# Patient Record
Sex: Female | Born: 1935 | Race: White | Hispanic: No | Smoking: Former smoker
Health system: Southern US, Community
[De-identification: ages and names within clinical notes are randomized; demographics above are authoritative.]

## PROBLEM LIST (undated history)

## (undated) DIAGNOSIS — K219 Gastro-esophageal reflux disease without esophagitis: Secondary | ICD-10-CM

## (undated) DIAGNOSIS — E1121 Type 2 diabetes mellitus with diabetic nephropathy: Secondary | ICD-10-CM

## (undated) DIAGNOSIS — M1990 Unspecified osteoarthritis, unspecified site: Secondary | ICD-10-CM

## (undated) DIAGNOSIS — I509 Heart failure, unspecified: Secondary | ICD-10-CM

## (undated) DIAGNOSIS — R32 Unspecified urinary incontinence: Secondary | ICD-10-CM

## (undated) DIAGNOSIS — E119 Type 2 diabetes mellitus without complications: Secondary | ICD-10-CM

## (undated) DIAGNOSIS — I2510 Atherosclerotic heart disease of native coronary artery without angina pectoris: Secondary | ICD-10-CM

## (undated) DIAGNOSIS — D696 Thrombocytopenia, unspecified: Secondary | ICD-10-CM

## (undated) DIAGNOSIS — E1142 Type 2 diabetes mellitus with diabetic polyneuropathy: Secondary | ICD-10-CM

## (undated) DIAGNOSIS — E669 Obesity, unspecified: Secondary | ICD-10-CM

## (undated) DIAGNOSIS — I10 Essential (primary) hypertension: Secondary | ICD-10-CM

## (undated) DIAGNOSIS — G2581 Restless legs syndrome: Secondary | ICD-10-CM

## (undated) DIAGNOSIS — E785 Hyperlipidemia, unspecified: Secondary | ICD-10-CM

## (undated) DIAGNOSIS — E113299 Type 2 diabetes mellitus with mild nonproliferative diabetic retinopathy without macular edema, unspecified eye: Secondary | ICD-10-CM

## (undated) HISTORY — PX: OTHER SURGICAL HISTORY: SHX169

## (undated) HISTORY — PX: ABDOMINAL HYSTERECTOMY: SHX81

## (undated) HISTORY — DX: Essential (primary) hypertension: I10

## (undated) HISTORY — DX: Thrombocytopenia, unspecified: D69.6

## (undated) HISTORY — DX: Obesity, unspecified: E66.9

## (undated) HISTORY — DX: Type 2 diabetes mellitus without complications: E11.9

## (undated) HISTORY — DX: Heart failure, unspecified: I50.9

## (undated) HISTORY — PX: CORONARY ANGIOPLASTY: SHX604

## (undated) HISTORY — DX: Atherosclerotic heart disease of native coronary artery without angina pectoris: I25.10

## (undated) HISTORY — DX: Hyperlipidemia, unspecified: E78.5

## (undated) HISTORY — PX: BONE MARROW BIOPSY: SHX199

---

## 1966-07-17 HISTORY — PX: CHOLECYSTECTOMY: SHX55

## 1968-07-17 HISTORY — PX: BREAST BIOPSY: SHX20

## 2003-02-15 HISTORY — PX: CORONARY ARTERY BYPASS GRAFT: SHX141

## 2011-09-18 ENCOUNTER — Ambulatory Visit: Payer: Self-pay | Admitting: Internal Medicine

## 2012-05-27 ENCOUNTER — Ambulatory Visit: Payer: Self-pay | Admitting: Internal Medicine

## 2012-05-27 LAB — CREATININE, SERUM
Creatinine: 1.01 mg/dL (ref 0.60–1.30)
EGFR (Non-African Amer.): 53 — ABNORMAL LOW

## 2012-06-09 ENCOUNTER — Emergency Department: Payer: Self-pay | Admitting: Emergency Medicine

## 2012-06-09 LAB — BASIC METABOLIC PANEL
Anion Gap: 8 (ref 7–16)
BUN: 28 mg/dL — ABNORMAL HIGH (ref 7–18)
Calcium, Total: 9.8 mg/dL (ref 8.5–10.1)
Chloride: 102 mmol/L (ref 98–107)
Co2: 27 mmol/L (ref 21–32)
Creatinine: 1.12 mg/dL (ref 0.60–1.30)
EGFR (African American): 54 — ABNORMAL LOW
Potassium: 3.8 mmol/L (ref 3.5–5.1)
Sodium: 137 mmol/L (ref 136–145)

## 2012-06-09 LAB — CBC
HCT: 36.5 % (ref 35.0–47.0)
MCH: 27.2 pg (ref 26.0–34.0)
MCHC: 32.8 g/dL (ref 32.0–36.0)
MCV: 83 fL (ref 80–100)
RDW: 14.8 % — ABNORMAL HIGH (ref 11.5–14.5)

## 2012-06-09 LAB — CK TOTAL AND CKMB (NOT AT ARMC): CK, Total: 88 U/L (ref 21–215)

## 2012-11-15 ENCOUNTER — Ambulatory Visit: Payer: Self-pay | Admitting: Internal Medicine

## 2012-11-25 ENCOUNTER — Ambulatory Visit: Payer: Self-pay | Admitting: Internal Medicine

## 2014-02-16 ENCOUNTER — Ambulatory Visit: Payer: Self-pay | Admitting: Internal Medicine

## 2014-11-11 ENCOUNTER — Ambulatory Visit
Admit: 2014-11-11 | Disposition: A | Discharge status: Home / Self Care | Payer: Self-pay | Attending: Internal Medicine | Admitting: Internal Medicine

## 2015-08-04 ENCOUNTER — Other Ambulatory Visit: Payer: Self-pay | Admitting: Internal Medicine

## 2015-08-04 DIAGNOSIS — Z1231 Encounter for screening mammogram for malignant neoplasm of breast: Secondary | ICD-10-CM

## 2015-08-18 ENCOUNTER — Ambulatory Visit
Admission: RE | Admit: 2015-08-18 | Discharge: 2015-08-18 | Disposition: A | Discharge status: Home / Self Care | Payer: Medicare Other | Source: Ambulatory Visit | Attending: Internal Medicine | Admitting: Internal Medicine

## 2015-08-18 ENCOUNTER — Other Ambulatory Visit: Payer: Self-pay | Admitting: Internal Medicine

## 2015-08-18 DIAGNOSIS — Z1231 Encounter for screening mammogram for malignant neoplasm of breast: Secondary | ICD-10-CM | POA: Insufficient documentation

## 2015-11-29 ENCOUNTER — Ambulatory Visit
Admission: RE | Admit: 2015-11-29 | Discharge: 2015-11-29 | Disposition: A | Discharge status: Home / Self Care | Payer: Medicare Other | Source: Ambulatory Visit | Attending: Internal Medicine | Admitting: Internal Medicine

## 2015-11-29 ENCOUNTER — Other Ambulatory Visit: Payer: Self-pay | Admitting: Internal Medicine

## 2015-11-29 DIAGNOSIS — R600 Localized edema: Secondary | ICD-10-CM | POA: Diagnosis present

## 2016-08-15 ENCOUNTER — Other Ambulatory Visit: Payer: Self-pay | Admitting: Internal Medicine

## 2016-08-15 DIAGNOSIS — Z1231 Encounter for screening mammogram for malignant neoplasm of breast: Secondary | ICD-10-CM

## 2016-08-24 ENCOUNTER — Ambulatory Visit
Admission: RE | Admit: 2016-08-24 | Discharge: 2016-08-24 | Disposition: A | Discharge status: Home / Self Care | Payer: Medicare Other | Source: Ambulatory Visit | Attending: Internal Medicine | Admitting: Internal Medicine

## 2016-08-24 DIAGNOSIS — R921 Mammographic calcification found on diagnostic imaging of breast: Secondary | ICD-10-CM | POA: Insufficient documentation

## 2016-08-24 DIAGNOSIS — Z1231 Encounter for screening mammogram for malignant neoplasm of breast: Secondary | ICD-10-CM | POA: Diagnosis not present

## 2016-08-28 ENCOUNTER — Other Ambulatory Visit: Payer: Self-pay | Admitting: Internal Medicine

## 2016-08-28 DIAGNOSIS — R928 Other abnormal and inconclusive findings on diagnostic imaging of breast: Secondary | ICD-10-CM

## 2016-08-28 DIAGNOSIS — R921 Mammographic calcification found on diagnostic imaging of breast: Secondary | ICD-10-CM

## 2016-09-08 ENCOUNTER — Ambulatory Visit
Admission: RE | Admit: 2016-09-08 | Discharge: 2016-09-08 | Disposition: A | Discharge status: Home / Self Care | Payer: Medicare Other | Source: Ambulatory Visit | Attending: Internal Medicine | Admitting: Internal Medicine

## 2016-09-08 DIAGNOSIS — R921 Mammographic calcification found on diagnostic imaging of breast: Secondary | ICD-10-CM | POA: Insufficient documentation

## 2016-09-08 DIAGNOSIS — R928 Other abnormal and inconclusive findings on diagnostic imaging of breast: Secondary | ICD-10-CM

## 2016-09-11 ENCOUNTER — Other Ambulatory Visit: Payer: Self-pay | Admitting: Internal Medicine

## 2016-09-11 DIAGNOSIS — R921 Mammographic calcification found on diagnostic imaging of breast: Secondary | ICD-10-CM

## 2016-09-11 DIAGNOSIS — R928 Other abnormal and inconclusive findings on diagnostic imaging of breast: Secondary | ICD-10-CM

## 2016-09-13 ENCOUNTER — Ambulatory Visit
Admission: RE | Admit: 2016-09-13 | Discharge: 2016-09-13 | Disposition: A | Discharge status: Home / Self Care | Payer: Medicare Other | Source: Ambulatory Visit | Attending: Internal Medicine | Admitting: Internal Medicine

## 2016-09-13 DIAGNOSIS — R921 Mammographic calcification found on diagnostic imaging of breast: Secondary | ICD-10-CM | POA: Insufficient documentation

## 2016-09-13 DIAGNOSIS — N6021 Fibroadenosis of right breast: Secondary | ICD-10-CM | POA: Insufficient documentation

## 2016-09-13 DIAGNOSIS — R928 Other abnormal and inconclusive findings on diagnostic imaging of breast: Secondary | ICD-10-CM

## 2016-09-13 DIAGNOSIS — N6091 Unspecified benign mammary dysplasia of right breast: Secondary | ICD-10-CM | POA: Insufficient documentation

## 2016-09-13 HISTORY — PX: BREAST BIOPSY: SHX20

## 2016-09-15 LAB — SURGICAL PATHOLOGY

## 2016-12-26 ENCOUNTER — Other Ambulatory Visit
Admission: RE | Admit: 2016-12-26 | Discharge: 2016-12-26 | Disposition: A | Discharge status: Home / Self Care | Payer: Medicare Other | Source: Ambulatory Visit | Attending: Gastroenterology | Admitting: Gastroenterology

## 2016-12-26 DIAGNOSIS — D649 Anemia, unspecified: Secondary | ICD-10-CM | POA: Insufficient documentation

## 2016-12-26 DIAGNOSIS — R195 Other fecal abnormalities: Secondary | ICD-10-CM | POA: Diagnosis present

## 2016-12-26 LAB — GASTROINTESTINAL PANEL BY PCR, STOOL (REPLACES STOOL CULTURE)
ADENOVIRUS F40/41: NOT DETECTED
Astrovirus: NOT DETECTED
CAMPYLOBACTER SPECIES: NOT DETECTED
Cryptosporidium: NOT DETECTED
Cyclospora cayetanensis: NOT DETECTED
ENTEROAGGREGATIVE E COLI (EAEC): NOT DETECTED
ENTEROPATHOGENIC E COLI (EPEC): NOT DETECTED
Entamoeba histolytica: NOT DETECTED
Enterotoxigenic E coli (ETEC): NOT DETECTED
GIARDIA LAMBLIA: NOT DETECTED
NOROVIRUS GI/GII: NOT DETECTED
PLESIMONAS SHIGELLOIDES: NOT DETECTED
ROTAVIRUS A: NOT DETECTED
SHIGELLA/ENTEROINVASIVE E COLI (EIEC): NOT DETECTED
Salmonella species: NOT DETECTED
Sapovirus (I, II, IV, and V): NOT DETECTED
Shiga like toxin producing E coli (STEC): NOT DETECTED
Vibrio cholerae: NOT DETECTED
Vibrio species: NOT DETECTED
Yersinia enterocolitica: NOT DETECTED

## 2017-01-01 ENCOUNTER — Encounter: Payer: Self-pay | Admitting: *Deleted

## 2017-01-02 ENCOUNTER — Ambulatory Visit
Admission: RE | Admit: 2017-01-02 | Discharge: 2017-01-02 | Disposition: A | Discharge status: Home / Self Care | Payer: Medicare Other | Source: Ambulatory Visit | Attending: Gastroenterology | Admitting: Gastroenterology

## 2017-01-02 ENCOUNTER — Encounter: Payer: Self-pay | Admitting: Anesthesiology

## 2017-01-02 ENCOUNTER — Ambulatory Visit: Payer: Medicare Other | Admitting: Certified Registered Nurse Anesthetist

## 2017-01-02 ENCOUNTER — Encounter
Admission: RE | Disposition: A | Discharge status: Home / Self Care | Payer: Self-pay | Source: Ambulatory Visit | Attending: Gastroenterology

## 2017-01-02 ENCOUNTER — Telehealth: Payer: Self-pay | Admitting: Interventional Cardiology

## 2017-01-02 DIAGNOSIS — K529 Noninfective gastroenteritis and colitis, unspecified: Secondary | ICD-10-CM | POA: Insufficient documentation

## 2017-01-02 DIAGNOSIS — Z951 Presence of aortocoronary bypass graft: Secondary | ICD-10-CM | POA: Diagnosis not present

## 2017-01-02 DIAGNOSIS — Z794 Long term (current) use of insulin: Secondary | ICD-10-CM | POA: Diagnosis not present

## 2017-01-02 DIAGNOSIS — Z6838 Body mass index (BMI) 38.0-38.9, adult: Secondary | ICD-10-CM | POA: Diagnosis not present

## 2017-01-02 DIAGNOSIS — E1142 Type 2 diabetes mellitus with diabetic polyneuropathy: Secondary | ICD-10-CM | POA: Diagnosis not present

## 2017-01-02 DIAGNOSIS — E11319 Type 2 diabetes mellitus with unspecified diabetic retinopathy without macular edema: Secondary | ICD-10-CM | POA: Diagnosis not present

## 2017-01-02 DIAGNOSIS — I2510 Atherosclerotic heart disease of native coronary artery without angina pectoris: Secondary | ICD-10-CM | POA: Insufficient documentation

## 2017-01-02 DIAGNOSIS — I10 Essential (primary) hypertension: Secondary | ICD-10-CM | POA: Insufficient documentation

## 2017-01-02 DIAGNOSIS — Z87891 Personal history of nicotine dependence: Secondary | ICD-10-CM | POA: Diagnosis not present

## 2017-01-02 DIAGNOSIS — E785 Hyperlipidemia, unspecified: Secondary | ICD-10-CM | POA: Diagnosis not present

## 2017-01-02 DIAGNOSIS — Q399 Congenital malformation of esophagus, unspecified: Secondary | ICD-10-CM | POA: Insufficient documentation

## 2017-01-02 DIAGNOSIS — K2960 Other gastritis without bleeding: Secondary | ICD-10-CM | POA: Insufficient documentation

## 2017-01-02 DIAGNOSIS — E1121 Type 2 diabetes mellitus with diabetic nephropathy: Secondary | ICD-10-CM | POA: Diagnosis not present

## 2017-01-02 DIAGNOSIS — Z7982 Long term (current) use of aspirin: Secondary | ICD-10-CM | POA: Insufficient documentation

## 2017-01-02 DIAGNOSIS — D124 Benign neoplasm of descending colon: Secondary | ICD-10-CM | POA: Insufficient documentation

## 2017-01-02 DIAGNOSIS — K210 Gastro-esophageal reflux disease with esophagitis: Secondary | ICD-10-CM | POA: Diagnosis not present

## 2017-01-02 DIAGNOSIS — D509 Iron deficiency anemia, unspecified: Secondary | ICD-10-CM | POA: Insufficient documentation

## 2017-01-02 DIAGNOSIS — Z880 Allergy status to penicillin: Secondary | ICD-10-CM | POA: Insufficient documentation

## 2017-01-02 DIAGNOSIS — E669 Obesity, unspecified: Secondary | ICD-10-CM | POA: Insufficient documentation

## 2017-01-02 DIAGNOSIS — Z955 Presence of coronary angioplasty implant and graft: Secondary | ICD-10-CM | POA: Insufficient documentation

## 2017-01-02 DIAGNOSIS — G2581 Restless legs syndrome: Secondary | ICD-10-CM | POA: Diagnosis not present

## 2017-01-02 HISTORY — PX: COLONOSCOPY WITH PROPOFOL: SHX5780

## 2017-01-02 HISTORY — DX: Unspecified urinary incontinence: R32

## 2017-01-02 HISTORY — PX: ESOPHAGOGASTRODUODENOSCOPY (EGD) WITH PROPOFOL: SHX5813

## 2017-01-02 HISTORY — DX: Type 2 diabetes mellitus with diabetic polyneuropathy: E11.42

## 2017-01-02 HISTORY — DX: Gastro-esophageal reflux disease without esophagitis: K21.9

## 2017-01-02 HISTORY — DX: Restless legs syndrome: G25.81

## 2017-01-02 HISTORY — DX: Type 2 diabetes mellitus with diabetic nephropathy: E11.21

## 2017-01-02 HISTORY — DX: Unspecified osteoarthritis, unspecified site: M19.90

## 2017-01-02 HISTORY — DX: Type 2 diabetes mellitus with mild nonproliferative diabetic retinopathy without macular edema, unspecified eye: E11.3299

## 2017-01-02 LAB — GLUCOSE, CAPILLARY: Glucose-Capillary: 101 mg/dL — ABNORMAL HIGH (ref 65–99)

## 2017-01-02 SURGERY — COLONOSCOPY WITH PROPOFOL
Anesthesia: General

## 2017-01-02 MED ORDER — GLYCOPYRROLATE 0.2 MG/ML IJ SOLN
INTRAMUSCULAR | Status: DC | PRN
  Administered 2017-01-02: 0.2 mg via INTRAVENOUS

## 2017-01-02 MED ORDER — LIDOCAINE HCL (PF) 2 % IJ SOLN
INTRAMUSCULAR | Status: AC
  Filled 2017-01-02: qty 2

## 2017-01-02 MED ORDER — SODIUM CHLORIDE 0.9 % IV SOLN
INTRAVENOUS | Status: DC
  Administered 2017-01-02: 1000 mL via INTRAVENOUS

## 2017-01-02 MED ORDER — FENTANYL CITRATE (PF) 100 MCG/2ML IJ SOLN
INTRAMUSCULAR | Status: AC
  Filled 2017-01-02: qty 2

## 2017-01-02 MED ORDER — PROPOFOL 10 MG/ML IV BOLUS
INTRAVENOUS | Status: DC | PRN
  Administered 2017-01-02 (×4): 16 mg via INTRAVENOUS

## 2017-01-02 MED ORDER — GLYCOPYRROLATE 0.2 MG/ML IJ SOLN
INTRAMUSCULAR | Status: AC
  Filled 2017-01-02: qty 1

## 2017-01-02 MED ORDER — FENTANYL CITRATE (PF) 100 MCG/2ML IJ SOLN
INTRAMUSCULAR | Status: DC | PRN
  Administered 2017-01-02: 100 ug via INTRAVENOUS

## 2017-01-02 MED ORDER — SODIUM CHLORIDE 0.9 % IV SOLN: INTRAVENOUS | Status: DC

## 2017-01-02 MED ORDER — PROPOFOL 500 MG/50ML IV EMUL
INTRAVENOUS | Status: DC | PRN
  Administered 2017-01-02: 70 ug/kg/min via INTRAVENOUS

## 2017-01-02 MED ORDER — PROPOFOL 500 MG/50ML IV EMUL
INTRAVENOUS | Status: AC
  Filled 2017-01-02: qty 50

## 2017-01-02 MED ORDER — LIDOCAINE HCL (CARDIAC) 20 MG/ML IV SOLN
INTRAVENOUS | Status: DC | PRN
  Administered 2017-01-02: 70 mg via INTRAVENOUS
  Administered 2017-01-02: 100 mg via INTRAVENOUS
  Administered 2017-01-02: 30 mg via INTRAVENOUS

## 2017-01-02 NOTE — Transfer of Care (Signed)
Immediate Anesthesia Transfer of Care Note  Patient: Katherine Hernandez  Procedure(s) Performed: Procedure(s): COLONOSCOPY WITH PROPOFOL (N/A) ESOPHAGOGASTRODUODENOSCOPY (EGD) WITH PROPOFOL (N/A)  Patient Location: PACU  Anesthesia Type:General  Level of Consciousness: awake and patient cooperative  Airway & Oxygen Therapy: Patient Spontanous Breathing and Patient connected to nasal cannula oxygen  Post-op Assessment: Report given to RN and Post -op Vital signs reviewed and stable  Post vital signs: Reviewed and stable  Last Vitals:  Vitals:   01/02/17 0658  BP: (!) 180/44  Pulse: 82  Resp: 16  Temp: (!) 35.7 C    Last Pain:  Vitals:   01/02/17 0658  TempSrc: Tympanic         Complications: No apparent anesthesia complications

## 2017-01-02 NOTE — Op Note (Signed)
[facility] Gastroenterology Patient Name: Katherine Hernandez Procedure Date: 01/02/2017 7:36 AM MRN: [id number] Account #: [id number] Date of Birth: Dec 08, 1929 Admit Type: Outpatient Age: 83 Room: [facility] ENDO ROOM 3 Gender: Female Note Status: Finalized Procedure:            Colonoscopy Indications:          Heme positive stool, Iron deficiency anemia Providers:            Lollie Sails, MD Referring MD:         Tracie Harrier, MD (Referring MD) Medicines:            Monitored Anesthesia Care Complications:        No immediate complications. Procedure:            Pre-Anesthesia Assessment:                       - ASA Grade Assessment: III - A patient with severe                        systemic disease.                       After obtaining informed consent, the colonoscope was                        passed under direct vision. Throughout the procedure,                        the patient's blood pressure, pulse, and oxygen                        saturations were monitored continuously. The                        Colonoscope was introduced through the anus and                        advanced to the the terminal ileum. The colonoscopy was                        performed without difficulty. The patient tolerated the                        procedure well. The quality of the bowel preparation                        was fair. Findings:      A single (solitary) twelve mm ulcer with a shallow ulcer crater was       found at the ileocecal valve. No bleeding was present. No stigmata of       recent bleeding were seen. Biopsies were taken with a cold forceps for       histology.      Biopsies for histology were taken with a cold forceps from the right       colon and left colon for evaluation of microscopic colitis.      A 5 mm polyp was found in the descending colon. The polyp was flat. The       polyp was removed with a cold snare. Resection and retrieval were   complete.      The retroflexed  view of the distal rectum and anal verge was normal and       showed no anal or rectal abnormalities.      The digital rectal exam was normal. Impression:           - Preparation of the colon was fair.                       - A single (solitary) ulcer at the distal ileocecal                        valve. Biopsied.                       - One 5 mm polyp in the descending colon, removed with                        a cold snare. Resected and retrieved.                       - The distal rectum and anal verge are normal on                        retroflexion view.                       - Biopsies were taken with a cold forceps from the                        right colon and left colon for evaluation of                        microscopic colitis. Recommendation:       - Discharge patient to home.                       - Await pathology results.                       - Return to GI clinic in 4 weeks. Procedure Code(s):    --- Professional ---                       [phone], Colonoscopy, flexible; with removal of tumor(s),                        polyp(s), or other lesion(s) by snare technique                       [id number], 80, Colonoscopy, flexible; with biopsy, single                        or multiple Diagnosis Code(s):    --- Professional ---                       K63.3, Ulcer of intestine                       D12.4, Benign neoplasm of descending colon                       R19.5, Other fecal abnormalities  D50.9, Iron deficiency anemia, unspecified CPT copyright 2016 American Medical Association. All rights reserved. The codes documented in this report are preliminary and upon coder review may  be revised to meet current compliance requirements. Lollie Sails, MD 01/02/2017 8:33:18 AM This report has been signed electronically. Number of Addenda: 0 Note Initiated On: 01/02/2017 7:36 AM Scope Withdrawal Time: 0 hours 13 minutes 13  seconds  Total Procedure Duration: 0 hours 20 minutes 59 seconds       [employer] [facility]

## 2017-01-02 NOTE — H&P (Signed)
Outpatient short stay form Pre-procedure 01/02/2017 7:21 AM Lollie Sails MD  Primary Physician: Dr Tracie Harrier, Dr. Daneen Schick  Reason for visit:  EGD and colonoscopy  History of present illness:  Patient is a 83 year old female presenting today as above. She has a history of some abdominal pain has been occurring in her epigastric region but also complained of some right lower quadrant pain. She has been found to be iron deficient with a trace of blood in her stool. He has seen some very dark stools on occasion. She had been taking both NSAIDs several times a week as well as a daily 325 mg aspirin. She is taking that dose for many years. She states she is held that for this procedure for about 5 or 6 days. She tolerated her prep well. She denies any other aspirin products or blood thinning agents. She does take a twice a day PPI which has helped somewhat with the epigastric pain. She has had a colonoscopy in the past however this was about 20 or 30 years ago. There is no known history of colon cancer or gastric cancer in her family.    Current Facility-Administered Medications:  .  0.9 %  sodium chloride infusion, , Intravenous, Continuous, Lollie Sails, MD, Last Rate: 20 mL/hr at 01/02/17 0714, 1,000 mL at 01/02/17 0714 .  0.9 %  sodium chloride infusion, , Intravenous, Continuous, Lollie Sails, MD  Prescriptions Prior to Admission  Medication Sig Dispense Refill Last Dose  . aspirin 325 MG tablet Take 325 mg by mouth daily.   Past Week at Unknown time  . budesonide-formoterol (SYMBICORT) 160-4.5 MCG/ACT inhaler Inhale 2 puffs into the lungs 2 (two) times daily.   01/02/2017 at 0600  . cetirizine (ZYRTEC) 10 MG tablet Take 10 mg by mouth daily.   Past Week at Unknown time  . colchicine 0.6 MG tablet Take 0.6 mg by mouth daily.   Past Week at Unknown time  . Cyanocobalamin (VITAMIN B 12 PO) Take 1,000 mcg by mouth as directed.   Past Week at Unknown time  . dicyclomine  (BENTYL) 10 MG capsule Take 10 mg by mouth 4 (four) times daily -  before meals and at bedtime.   Past Week at Unknown time  . ergocalciferol (VITAMIN D2) 50000 units capsule Take 50,000 Units by mouth once a week.   Past Week at Unknown time  . estradiol (ESTRACE) 0.1 MG/GM vaginal cream Place 1 Applicatorful vaginally 2 (two) times a week.   01/01/2017 at Unknown time  . exenatide (BYETTA) 5 MCG/0.02ML SOPN injection Inject 5 mcg into the skin 2 (two) times daily with a meal.   01/01/2017 at Unknown time  . furosemide (LASIX) 20 MG tablet Take 20 mg by mouth.   01/01/2017 at Unknown time  . gabapentin (NEURONTIN) 300 MG capsule Take 300 mg by mouth daily.   01/01/2017 at Unknown time  . insulin aspart (NOVOLOG) 100 UNIT/ML injection Inject into the skin 3 (three) times daily before meals.   01/01/2017 at Unknown time  . Insulin Glargine (LANTUS SOLOSTAR) 100 UNIT/ML Solostar Pen As directed   01/01/2017 at Unknown time  . Liraglutide (VICTOZA [place]) Inject 0.2 mg/mL into the skin at bedtime.   01/01/2017 at Unknown time  . LORazepam (ATIVAN) 0.5 MG tablet Take 0.5 mg by mouth every 8 (eight) hours.   Past Month at Unknown time  . metoprolol tartrate (LOPRESSOR) 25 MG tablet Take 25 mg by mouth daily.   01/01/2017  at Unknown time  . pantoprazole (PROTONIX) 40 MG tablet Take 40 mg by mouth daily.   01/01/2017 at Unknown time  . Polysaccharide Iron Complex (FERREX 150 PO) Take 150 mg by mouth daily.   Past Week at Unknown time  . potassium chloride SA (K-DUR,KLOR-CON) 20 MEQ tablet Take 20 mEq by mouth 2 (two) times daily.   01/01/2017 at Unknown time  . rOPINIRole (REQUIP) 0.5 MG tablet Take 0.5 mg by mouth at bedtime.   01/01/2017 at Unknown time  . sucralfate (CARAFATE) 1 g tablet Take 1 g by mouth 2 (two) times daily.   01/01/2017 at Unknown time  . valsartan-hydrochlorothiazide (DIOVAN-HCT) 320-25 MG per tablet Take 1 tablet by mouth daily.   01/02/2017 at 0600  . nitroGLYCERIN (NITROSTAT) 0.4 MG SL tablet  Place 0.4 mg under the tongue every 5 (five) minutes as needed for chest pain.   Not Taking at Unknown time     Allergies  Allergen Reactions  . Amoxicillin   . Cefdinir   . Doxycycline   . Erythromycin   . Lisinopril   . Macrodantin [Nitrofurantoin]   . Mobic [Meloxicam]     diarrhea  . Statins   . Sulfa Antibiotics      Past Medical History:  Diagnosis Date  . Arthritis   . CAD (coronary artery disease)    Last nuclear 02/2009 EF 72% and neg for ischemia   . Coronary artery disease    CAD with PTCA(cfx)fx -1993,and CABG ,2004  . Diabetes mellitus without complication ([place])   . Diabetic nephropathy ([place])   . Diabetic peripheral neuropathy ([place])   . GERD (gastroesophageal reflux disease)   . Hyperlipidemia   . Hypertension   . Obesity   . Retinopathy, diabetic, background ([place])   . RLS (restless legs syndrome)   . Urinary incontinence     Review of systems:      Physical Exam    Heart and lungs: Regular rate and rhythm without rub or gallop, lungs are bilaterally clear.    HEENT: Normocephalic atraumatic eyes are anicteric    Other:     Pertinant exam for procedure: Soft nontender nondistended bowel sounds positive normoactive    Planned proceedures: EGD, Colonoscopy and indicated procedures. I have discussed the risks benefits and complications of procedures to include not limited to bleeding, infection, perforation and the risk of sedation and the patient wishes to proceed.    Lollie Sails, MD Gastroenterology 01/02/2017  7:21 AM

## 2017-01-02 NOTE — Anesthesia Postprocedure Evaluation (Signed)
Anesthesia Post Note  Patient: Katherine Hernandez  Procedure(s) Performed: Procedure(s) (LRB): COLONOSCOPY WITH PROPOFOL (N/A) ESOPHAGOGASTRODUODENOSCOPY (EGD) WITH PROPOFOL (N/A)  Patient location during evaluation: Endoscopy Anesthesia Type: General Level of consciousness: awake and alert Pain management: pain level controlled Vital Signs Assessment: post-procedure vital signs reviewed and stable Respiratory status: spontaneous breathing, nonlabored ventilation, respiratory function stable and patient connected to nasal cannula oxygen Cardiovascular status: blood pressure returned to baseline and stable Postop Assessment: no signs of nausea or vomiting Anesthetic complications: no     Last Vitals:  Vitals:   01/02/17 0850 01/02/17 0900  BP: (!) 162/48 (!) 175/53  Pulse: 65 65  Resp: 19 16  Temp:      Last Pain:  Vitals:   01/02/17 0830  TempSrc: Tympanic                 Martha Clan

## 2017-01-02 NOTE — Telephone Encounter (Signed)
Notified Suanne Marker that this pt has never been seen by Dr Tamala Julian or our Cardiology office. Informed Rhonda at [facility] clinic, that this was verified by Dr Thompson Caul Nurse and a triage nurse.  Rhonda verbalized understanding and gracious for all the assistance provided.

## 2017-01-02 NOTE — Anesthesia Post-op Follow-up Note (Cosign Needed)
Anesthesia QCDR form completed.        

## 2017-01-02 NOTE — Op Note (Signed)
[facility] Gastroenterology Patient Name: Katherine Hernandez Procedure Date: 01/02/2017 7:36 AM MRN: [id number] Account #: [id number] Date of Birth: 1930-01-16 Admit Type: Outpatient Age: 83 Room: [facility] ENDO ROOM 3 Gender: Female Note Status: Finalized Procedure:            Upper GI endoscopy Indications:          Iron deficiency anemia, Heme positive stool Providers:            Lollie Sails, MD Referring MD:         Tracie Harrier, MD (Referring MD) Medicines:            Monitored Anesthesia Care Complications:        No immediate complications. Procedure:            Pre-Anesthesia Assessment:                       - ASA Grade Assessment: III - A patient with severe                        systemic disease.                       After obtaining informed consent, the endoscope was                        passed under direct vision. Throughout the procedure,                        the patient's blood pressure, pulse, and oxygen                        saturations were monitored continuously. The Endoscope                        was introduced through the mouth, and advanced to the                        third part of duodenum. The upper GI endoscopy was                        accomplished without difficulty. The patient tolerated                        the procedure well. Findings:      The Z-line was irregular. Biopsies were taken with a cold forceps for       histology.      The middle third of the esophagus and lower third of the esophagus were       moderately tortuous.      Diffuse moderate inflammation characterized by congestion (edema) and       erythema was found in the gastric body.      Diffuse and patchy moderate inflammation characterized by adherent       blood, erosions and erythema was found in the gastric antrum.      The in the duodenum was normal. Impression:           - Z-line irregular. Biopsied.                       - Tortuous esophagus.                       -  Bile gastritis.                       - Erosive gastritis.                       - Normal. Recommendation:       - Perform a colonoscopy today.                       - Use Protonix (pantoprazole) 40 mg PO daily daily.                       - Use sucralfate tablets 1 gram PO BID daily.                       - need to consider changing ASA daily dosage to 81 mg                        from 325 mg.                       - Return to GI clinic in 4 weeks. Procedure Code(s):    --- Professional ---                       [phone], Esophagogastroduodenoscopy, flexible, transoral;                        with biopsy, single or multiple Diagnosis Code(s):    --- Professional ---                       K22.8, Other specified diseases of esophagus                       Q39.9, Congenital malformation of esophagus, unspecified                       K29.60, Other gastritis without bleeding                       D50.9, Iron deficiency anemia, unspecified                       R19.5, Other fecal abnormalities CPT copyright 2016 American Medical Association. All rights reserved. The codes documented in this report are preliminary and upon coder review may  be revised to meet current compliance requirements. Lollie Sails, MD 01/02/2017 8:03:18 AM This report has been signed electronically. Number of Addenda: 0 Note Initiated On: 01/02/2017 7:36 AM      [facility]

## 2017-01-02 NOTE — Anesthesia Preprocedure Evaluation (Signed)
Anesthesia Evaluation  Patient identified by MRN, date of birth, ID band Patient awake    Reviewed: Allergy & Precautions, H&P , NPO status , Patient's Chart, lab work & pertinent test results, reviewed documented beta blocker date and time   History of Anesthesia Complications Negative for: history of anesthetic complications  Airway Mallampati: I  TM Distance: >3 FB Neck ROM: limited    Dental  (+) Implants, Caps, Missing, Teeth Intact   Pulmonary neg pulmonary ROS, former smoker,           Cardiovascular Exercise Tolerance: Good hypertension, (-) angina+ CAD and + CABG  (-) Past MI and (-) Cardiac Stents (-) dysrhythmias (-) Valvular Problems/Murmurs     Neuro/Psych neg Seizures  Neuromuscular disease negative psych ROS   GI/Hepatic Neg liver ROS, GERD  ,  Endo/Other  diabetes  Renal/GU CRFRenal disease  negative genitourinary   Musculoskeletal   Abdominal   Peds  Hematology negative hematology ROS (+)   Anesthesia Other Findings Past Medical History: No date: Arthritis No date: CAD (coronary artery disease)     Comment: Last nuclear 02/2009 EF 72% and neg for               ischemia  No date: Coronary artery disease     Comment: CAD with PTCA(cfx)fx -1993,and CABG ,2004 No date: Diabetes mellitus without complication (HCC) No date: Diabetic nephropathy (HCC) No date: Diabetic peripheral neuropathy (HCC) No date: GERD (gastroesophageal reflux disease) No date: Hyperlipidemia No date: Hypertension No date: Obesity No date: Retinopathy, diabetic, background ([place]) No date: RLS (restless legs syndrome) No date: Urinary incontinence   Reproductive/Obstetrics negative OB ROS                             Anesthesia Physical Anesthesia Plan  ASA: III  Anesthesia Plan: General   Post-op Pain Management:    Induction:   PONV Risk Score and Plan:   Airway Management Planned:    Additional Equipment:   Intra-op Plan:   Post-operative Plan:   Informed Consent: I have reviewed the patients History and Physical, chart, labs and discussed the procedure including the risks, benefits and alternatives for the proposed anesthesia with the patient or authorized representative who has indicated his/her understanding and acceptance.   Dental Advisory Given  Plan Discussed with: Anesthesiologist, CRNA and Surgeon  Anesthesia Plan Comments:         Anesthesia Quick Evaluation

## 2017-01-02 NOTE — Telephone Encounter (Signed)
New Message:   Can pt take 81 mg Aspirin instead of the 325 mg? If so,how long can pt stay on the 81 mg? Pt just had A Colonoscopy today.Please fax to [phone].

## 2017-01-03 ENCOUNTER — Encounter: Payer: Self-pay | Admitting: Gastroenterology

## 2017-01-03 LAB — SURGICAL PATHOLOGY

## 2017-01-10 ENCOUNTER — Other Ambulatory Visit: Payer: Self-pay | Admitting: Internal Medicine

## 2017-01-10 DIAGNOSIS — R600 Localized edema: Secondary | ICD-10-CM

## 2017-01-11 ENCOUNTER — Ambulatory Visit
Admission: RE | Admit: 2017-01-11 | Discharge: 2017-01-11 | Disposition: A | Discharge status: Home / Self Care | Payer: Medicare Other | Source: Ambulatory Visit | Attending: Internal Medicine | Admitting: Internal Medicine

## 2017-01-11 ENCOUNTER — Other Ambulatory Visit
Admission: RE | Admit: 2017-01-11 | Discharge: 2017-01-11 | Disposition: A | Discharge status: Home / Self Care | Payer: Medicare Other | Source: Ambulatory Visit | Attending: Gastroenterology | Admitting: Gastroenterology

## 2017-01-11 DIAGNOSIS — R600 Localized edema: Secondary | ICD-10-CM

## 2017-01-26 ENCOUNTER — Other Ambulatory Visit: Payer: Self-pay | Admitting: Gastroenterology

## 2017-01-26 DIAGNOSIS — D649 Anemia, unspecified: Secondary | ICD-10-CM

## 2017-02-14 ENCOUNTER — Ambulatory Visit
Admission: RE | Admit: 2017-02-14 | Discharge: 2017-02-14 | Disposition: A | Discharge status: Home / Self Care | Payer: Medicare Other | Source: Ambulatory Visit | Attending: Gastroenterology | Admitting: Gastroenterology

## 2017-02-14 DIAGNOSIS — D649 Anemia, unspecified: Secondary | ICD-10-CM

## 2017-04-17 ENCOUNTER — Encounter: Payer: Self-pay | Admitting: Internal Medicine

## 2017-05-25 ENCOUNTER — Inpatient Hospital Stay
Admission: EM | Admit: 2017-05-25 | Discharge: 2017-05-27 | DRG: 292 | Disposition: A | Discharge status: Home / Self Care | Payer: Medicare Other | Attending: Internal Medicine | Admitting: Internal Medicine

## 2017-05-25 ENCOUNTER — Other Ambulatory Visit: Payer: Self-pay

## 2017-05-25 ENCOUNTER — Emergency Department: Payer: Medicare Other

## 2017-05-25 ENCOUNTER — Encounter: Payer: Self-pay | Admitting: Emergency Medicine

## 2017-05-25 DIAGNOSIS — N179 Acute kidney failure, unspecified: Secondary | ICD-10-CM | POA: Diagnosis not present

## 2017-05-25 DIAGNOSIS — I4891 Unspecified atrial fibrillation: Secondary | ICD-10-CM | POA: Diagnosis present

## 2017-05-25 DIAGNOSIS — E785 Hyperlipidemia, unspecified: Secondary | ICD-10-CM | POA: Diagnosis present

## 2017-05-25 DIAGNOSIS — M109 Gout, unspecified: Secondary | ICD-10-CM | POA: Diagnosis present

## 2017-05-25 DIAGNOSIS — Z886 Allergy status to analgesic agent status: Secondary | ICD-10-CM

## 2017-05-25 DIAGNOSIS — K219 Gastro-esophageal reflux disease without esophagitis: Secondary | ICD-10-CM | POA: Diagnosis present

## 2017-05-25 DIAGNOSIS — Z880 Allergy status to penicillin: Secondary | ICD-10-CM

## 2017-05-25 DIAGNOSIS — I5033 Acute on chronic diastolic (congestive) heart failure: Secondary | ICD-10-CM | POA: Diagnosis present

## 2017-05-25 DIAGNOSIS — I2510 Atherosclerotic heart disease of native coronary artery without angina pectoris: Secondary | ICD-10-CM | POA: Diagnosis present

## 2017-05-25 DIAGNOSIS — E11319 Type 2 diabetes mellitus with unspecified diabetic retinopathy without macular edema: Secondary | ICD-10-CM | POA: Diagnosis present

## 2017-05-25 DIAGNOSIS — Z87891 Personal history of nicotine dependence: Secondary | ICD-10-CM | POA: Diagnosis not present

## 2017-05-25 DIAGNOSIS — Z951 Presence of aortocoronary bypass graft: Secondary | ICD-10-CM | POA: Diagnosis not present

## 2017-05-25 DIAGNOSIS — I160 Hypertensive urgency: Secondary | ICD-10-CM | POA: Diagnosis present

## 2017-05-25 DIAGNOSIS — Z79899 Other long term (current) drug therapy: Secondary | ICD-10-CM

## 2017-05-25 DIAGNOSIS — E1121 Type 2 diabetes mellitus with diabetic nephropathy: Secondary | ICD-10-CM | POA: Diagnosis present

## 2017-05-25 DIAGNOSIS — I248 Other forms of acute ischemic heart disease: Secondary | ICD-10-CM | POA: Diagnosis present

## 2017-05-25 DIAGNOSIS — Z882 Allergy status to sulfonamides status: Secondary | ICD-10-CM

## 2017-05-25 DIAGNOSIS — Z955 Presence of coronary angioplasty implant and graft: Secondary | ICD-10-CM | POA: Diagnosis not present

## 2017-05-25 DIAGNOSIS — E1142 Type 2 diabetes mellitus with diabetic polyneuropathy: Secondary | ICD-10-CM | POA: Diagnosis present

## 2017-05-25 DIAGNOSIS — Z888 Allergy status to other drugs, medicaments and biological substances status: Secondary | ICD-10-CM

## 2017-05-25 DIAGNOSIS — G4733 Obstructive sleep apnea (adult) (pediatric): Secondary | ICD-10-CM | POA: Diagnosis present

## 2017-05-25 DIAGNOSIS — J449 Chronic obstructive pulmonary disease, unspecified: Secondary | ICD-10-CM | POA: Diagnosis present

## 2017-05-25 DIAGNOSIS — I509 Heart failure, unspecified: Secondary | ICD-10-CM

## 2017-05-25 DIAGNOSIS — Z6836 Body mass index (BMI) 36.0-36.9, adult: Secondary | ICD-10-CM

## 2017-05-25 DIAGNOSIS — E669 Obesity, unspecified: Secondary | ICD-10-CM | POA: Diagnosis present

## 2017-05-25 DIAGNOSIS — G2581 Restless legs syndrome: Secondary | ICD-10-CM | POA: Diagnosis present

## 2017-05-25 DIAGNOSIS — Z7982 Long term (current) use of aspirin: Secondary | ICD-10-CM

## 2017-05-25 DIAGNOSIS — Z794 Long term (current) use of insulin: Secondary | ICD-10-CM

## 2017-05-25 DIAGNOSIS — I110 Hypertensive heart disease with heart failure: Secondary | ICD-10-CM | POA: Diagnosis not present

## 2017-05-25 DIAGNOSIS — I4892 Unspecified atrial flutter: Secondary | ICD-10-CM | POA: Diagnosis present

## 2017-05-25 DIAGNOSIS — J810 Acute pulmonary edema: Secondary | ICD-10-CM | POA: Diagnosis not present

## 2017-05-25 LAB — BASIC METABOLIC PANEL
ANION GAP: 10 (ref 5–15)
BUN: 31 mg/dL — AB (ref 6–20)
CHLORIDE: 104 mmol/L (ref 101–111)
CO2: 27 mmol/L (ref 22–32)
Calcium: 9.7 mg/dL (ref 8.9–10.3)
Creatinine, Ser: 1.29 mg/dL — ABNORMAL HIGH (ref 0.44–1.00)
GFR calc Af Amer: 43 mL/min — ABNORMAL LOW (ref >60)
GFR, EST NON AFRICAN AMERICAN: 37 mL/min — AB (ref >60)
GLUCOSE: 91 mg/dL (ref 65–99)
POTASSIUM: 3.4 mmol/L — AB (ref 3.5–5.1)
Sodium: 141 mmol/L (ref 135–145)

## 2017-05-25 LAB — CBC
HEMATOCRIT: 37.6 % (ref 35.0–47.0)
HEMOGLOBIN: 12.2 g/dL (ref 12.0–16.0)
MCH: 30.5 pg (ref 26.0–34.0)
MCHC: 32.6 g/dL (ref 32.0–36.0)
MCV: 93.6 fL (ref 80.0–100.0)
Platelets: 89 10*3/uL — ABNORMAL LOW (ref 150–440)
RBC: 4.02 MIL/uL (ref 3.80–5.20)
RDW: 15.3 % — ABNORMAL HIGH (ref 11.5–14.5)
WBC: 7.7 10*3/uL (ref 3.6–11.0)

## 2017-05-25 LAB — TROPONIN I: TROPONIN I: 0.04 ng/mL — AB (ref <0.03)

## 2017-05-25 LAB — MRSA PCR SCREENING: MRSA by PCR: NEGATIVE

## 2017-05-25 LAB — GLUCOSE, CAPILLARY: Glucose-Capillary: 138 mg/dL — ABNORMAL HIGH (ref 65–99)

## 2017-05-25 MED ORDER — ROPINIROLE HCL 1 MG PO TABS
0.5000 mg | ORAL_TABLET | Freq: Every day | ORAL | Status: DC
  Administered 2017-05-25 – 2017-05-26 (×2): 0.5 mg via ORAL
  Filled 2017-05-25 (×2): qty 1

## 2017-05-25 MED ORDER — LABETALOL HCL 5 MG/ML IV SOLN
5.0000 mg | Freq: Once | INTRAVENOUS | Status: AC
Start: 2017-05-25 — End: 2017-05-25
  Administered 2017-05-25: 5 mg via INTRAVENOUS
  Filled 2017-05-25: qty 4

## 2017-05-25 MED ORDER — ONDANSETRON HCL 4 MG PO TABS: 4.0000 mg | ORAL_TABLET | Freq: Four times a day (QID) | ORAL | Status: DC | PRN

## 2017-05-25 MED ORDER — LORAZEPAM 0.5 MG PO TABS
0.5000 mg | ORAL_TABLET | Freq: Three times a day (TID) | ORAL | Status: DC
  Administered 2017-05-26: 0.5 mg via ORAL
  Filled 2017-05-25 (×2): qty 1

## 2017-05-25 MED ORDER — GABAPENTIN 300 MG PO CAPS
300.0000 mg | ORAL_CAPSULE | Freq: Three times a day (TID) | ORAL | Status: DC
  Administered 2017-05-25 – 2017-05-27 (×5): 300 mg via ORAL
  Filled 2017-05-25 (×5): qty 1

## 2017-05-25 MED ORDER — METOPROLOL TARTRATE 25 MG PO TABS
25.0000 mg | ORAL_TABLET | Freq: Every day | ORAL | Status: DC
  Administered 2017-05-25 – 2017-05-26 (×2): 25 mg via ORAL
  Filled 2017-05-25 (×2): qty 1

## 2017-05-25 MED ORDER — INSULIN GLARGINE 100 UNIT/ML ~~LOC~~ SOLN
60.0000 [IU] | Freq: Two times a day (BID) | SUBCUTANEOUS | Status: DC
  Administered 2017-05-25 – 2017-05-26 (×2): 60 [IU] via SUBCUTANEOUS
  Filled 2017-05-25 (×3): qty 0.6

## 2017-05-25 MED ORDER — HEPARIN BOLUS VIA INFUSION
4300.0000 [IU] | Freq: Once | INTRAVENOUS | Status: AC
  Administered 2017-05-25: 4300 [IU] via INTRAVENOUS
  Filled 2017-05-25: qty 4300

## 2017-05-25 MED ORDER — NITROGLYCERIN 0.4 MG SL SUBL
0.4000 mg | SUBLINGUAL_TABLET | SUBLINGUAL | Status: DC | PRN
  Administered 2017-05-25: 0.4 mg via SUBLINGUAL
  Filled 2017-05-25: qty 1

## 2017-05-25 MED ORDER — LOSARTAN POTASSIUM 50 MG PO TABS
100.0000 mg | ORAL_TABLET | Freq: Every day | ORAL | Status: DC
  Administered 2017-05-25 – 2017-05-27 (×3): 100 mg via ORAL
  Filled 2017-05-25 (×3): qty 2

## 2017-05-25 MED ORDER — ONDANSETRON HCL 4 MG/2ML IJ SOLN: 4.0000 mg | Freq: Four times a day (QID) | INTRAMUSCULAR | Status: DC | PRN

## 2017-05-25 MED ORDER — ASPIRIN EC 81 MG PO TBEC
81.0000 mg | DELAYED_RELEASE_TABLET | Freq: Every day | ORAL | Status: DC
  Administered 2017-05-25 – 2017-05-27 (×3): 81 mg via ORAL
  Filled 2017-05-25 (×3): qty 1

## 2017-05-25 MED ORDER — IPRATROPIUM-ALBUTEROL 0.5-2.5 (3) MG/3ML IN SOLN
3.0000 mL | Freq: Once | RESPIRATORY_TRACT | Status: AC
  Administered 2017-05-25: 3 mL via RESPIRATORY_TRACT
  Filled 2017-05-25: qty 3

## 2017-05-25 MED ORDER — FUROSEMIDE 10 MG/ML IJ SOLN
40.0000 mg | Freq: Two times a day (BID) | INTRAMUSCULAR | Status: DC
  Administered 2017-05-25 – 2017-05-27 (×4): 40 mg via INTRAVENOUS
  Filled 2017-05-25 (×4): qty 4

## 2017-05-25 MED ORDER — ACETAMINOPHEN 650 MG RE SUPP: 650.0000 mg | Freq: Four times a day (QID) | RECTAL | Status: DC | PRN

## 2017-05-25 MED ORDER — LORATADINE 10 MG PO TABS
10.0000 mg | ORAL_TABLET | Freq: Every day | ORAL | Status: DC
Start: 2017-05-25 — End: 2017-05-27
  Administered 2017-05-25 – 2017-05-27 (×3): 10 mg via ORAL
  Filled 2017-05-25 (×3): qty 1

## 2017-05-25 MED ORDER — PANTOPRAZOLE SODIUM 40 MG PO TBEC
40.0000 mg | DELAYED_RELEASE_TABLET | Freq: Two times a day (BID) | ORAL | Status: DC
  Administered 2017-05-25 – 2017-05-27 (×4): 40 mg via ORAL
  Filled 2017-05-25 (×4): qty 1

## 2017-05-25 MED ORDER — COLCHICINE 0.6 MG PO TABS
0.6000 mg | ORAL_TABLET | Freq: Every day | ORAL | Status: DC
  Administered 2017-05-25: 0.6 mg via ORAL
  Filled 2017-05-25 (×2): qty 1

## 2017-05-25 MED ORDER — ESTRADIOL 0.1 MG/GM VA CREA
1.0000 | TOPICAL_CREAM | VAGINAL | Status: DC
  Filled 2017-05-25: qty 42.5

## 2017-05-25 MED ORDER — ACETAMINOPHEN 325 MG PO TABS: 650.0000 mg | ORAL_TABLET | Freq: Four times a day (QID) | ORAL | Status: DC | PRN

## 2017-05-25 MED ORDER — SUCRALFATE 1 G PO TABS
1.0000 g | ORAL_TABLET | Freq: Two times a day (BID) | ORAL | Status: DC
  Administered 2017-05-25 – 2017-05-27 (×4): 1 g via ORAL
  Filled 2017-05-25 (×4): qty 1

## 2017-05-25 MED ORDER — FUROSEMIDE 10 MG/ML IJ SOLN
60.0000 mg | Freq: Once | INTRAMUSCULAR | Status: AC
  Administered 2017-05-25: 60 mg via INTRAVENOUS
  Filled 2017-05-25: qty 8

## 2017-05-25 MED ORDER — NITROGLYCERIN 0.4 MG SL SUBL: 0.4000 mg | SUBLINGUAL_TABLET | SUBLINGUAL | Status: DC | PRN

## 2017-05-25 MED ORDER — HEPARIN (PORCINE) IN NACL 100-0.45 UNIT/ML-% IJ SOLN
1000.0000 [IU]/h | INTRAMUSCULAR | Status: DC
  Administered 2017-05-25: 1000 [IU]/h via INTRAVENOUS
  Filled 2017-05-25: qty 250

## 2017-05-25 MED ORDER — NITROGLYCERIN 2 % TD OINT
1.0000 [in_us] | TOPICAL_OINTMENT | Freq: Once | TRANSDERMAL | Status: AC
  Administered 2017-05-25: 1 [in_us] via TOPICAL
  Filled 2017-05-25: qty 1

## 2017-05-25 MED ORDER — MOMETASONE FURO-FORMOTEROL FUM 200-5 MCG/ACT IN AERO
2.0000 | INHALATION_SPRAY | Freq: Two times a day (BID) | RESPIRATORY_TRACT | Status: DC
  Administered 2017-05-25 – 2017-05-27 (×4): 2 via RESPIRATORY_TRACT
  Filled 2017-05-25: qty 8.8

## 2017-05-25 MED ORDER — ALBUTEROL SULFATE (2.5 MG/3ML) 0.083% IN NEBU
2.5000 mg | INHALATION_SOLUTION | Freq: Four times a day (QID) | RESPIRATORY_TRACT | Status: DC | PRN
  Administered 2017-05-26 – 2017-05-27 (×3): 2.5 mg via RESPIRATORY_TRACT
  Filled 2017-05-25 (×3): qty 3

## 2017-05-25 MED ORDER — ALLOPURINOL 100 MG PO TABS
100.0000 mg | ORAL_TABLET | Freq: Every day | ORAL | Status: DC
  Administered 2017-05-25 – 2017-05-27 (×3): 100 mg via ORAL
  Filled 2017-05-25 (×3): qty 1

## 2017-05-25 MED ORDER — INSULIN ASPART 100 UNIT/ML ~~LOC~~ SOLN
15.0000 [IU] | Freq: Three times a day (TID) | SUBCUTANEOUS | Status: DC
  Administered 2017-05-26 – 2017-05-27 (×4): 15 [IU] via SUBCUTANEOUS
  Filled 2017-05-25 (×4): qty 1

## 2017-05-25 MED ORDER — POTASSIUM CHLORIDE CRYS ER 20 MEQ PO TBCR
40.0000 meq | EXTENDED_RELEASE_TABLET | Freq: Once | ORAL | Status: AC
  Administered 2017-05-25: 40 meq via ORAL
  Filled 2017-05-25: qty 2

## 2017-05-25 MED ORDER — MAGNESIUM SULFATE 2 GM/50ML IV SOLN
2.0000 g | Freq: Once | INTRAVENOUS | Status: AC
  Administered 2017-05-25: 2 g via INTRAVENOUS
  Filled 2017-05-25: qty 50

## 2017-05-25 NOTE — H&P (Signed)
[facility] [place] NAME: Katherine Hernandez    MR#:  [id number]  DATE OF BIRTH:  26-Aug-1929  DATE OF ADMISSION:  05/25/2017  PRIMARY CARE PHYSICIAN: Tracie Harrier, MD   REQUESTING/REFERRING PHYSICIAN: Dr. Merlyn Lot  CHIEF COMPLAINT:   Chief Complaint  Patient presents with  . Shortness of Breath    HISTORY OF PRESENT ILLNESS:  Katherine Hernandez  is a 83 y.o. female with a known history of diabetes, diabetic neuropathy, history of coronary artery disease, GERD, hypertension, hyperlipidemia, obstructive sleep apnea, restless leg syndrome who presents to the hospital due to shortness of breath. Patient says she's been having progressive shortness of breath now for the past 2-3 weeks but much worse the past 2 days. She has significant exertional dyspnea and therefore was going to see her primary care physician today who referred her to the ER for further evaluation. Patient denies any chest pain but admits to some two-pillow orthopnea which is baseline along with some intermittent paroxysmal nocturnal dyspnea. She does have chronic lower extremity edema and it has not: Any worse over the past 2 days. She presented to the ER and was noted to be in congestive heart failure and also noted to be in new onset atrial fibrillation/flutter. She denies any palpitations, syncope, nausea, vomiting, abdominal pain, melena, hematochezia, hematuria or any other associated symptoms presently.  PAST MEDICAL HISTORY:   Past Medical History:  Diagnosis Date  . Arthritis   . CAD (coronary artery disease)    Last nuclear 02/2009 EF 72% and neg for ischemia   . Coronary artery disease    CAD with PTCA(cfx)fx -1993,and CABG ,2004  . Diabetes mellitus without complication ([place])   . Diabetic nephropathy ([place])   . Diabetic peripheral neuropathy ([place])   . GERD (gastroesophageal reflux disease)   . Hyperlipidemia   . Hypertension   . Obesity   . Retinopathy, diabetic,  background ([place])   . RLS (restless legs syndrome)   . Urinary incontinence     PAST SURGICAL HISTORY:   Past Surgical History:  Procedure Laterality Date  . ABDOMINAL HYSTERECTOMY    . BREAST BIOPSY Left 1970   surgical exc  . BREAST BIOPSY Right 09/13/2016   path pending x 2 areas  . CHOLECYSTECTOMY  1968  . CORONARY ANGIOPLASTY    . CORONARY ARTERY BYPASS GRAFT  02/2003  . ery      SOCIAL HISTORY:   Social History   Tobacco Use  . Smoking status: Former Smoker    Packs/day: 1.00    Years: 15.00    Pack years: 15.00    Types: Cigarettes    Last attempt to quit: 07/17/1978    Years since quitting: 38.8  . Smokeless tobacco: Never Used  . Tobacco comment: quit in year 1980  Substance Use Topics  . Alcohol use: No    FAMILY HISTORY:   Family History  Problem Relation Age of Onset  . Breast cancer Sister 4  . Breast cancer Sister   . Diabetes Mother   . Heart disease Mother   . Kidney disease Father   . Hypertension Father     DRUG ALLERGIES:   Allergies  Allergen Reactions  . Amoxicillin   . Cefdinir   . Doxycycline   . Erythromycin   . Lisinopril   . Macrodantin [Nitrofurantoin]   . Mobic [Meloxicam]     diarrhea  . Statins   . Sulfa Antibiotics     REVIEW  OF SYSTEMS:   Review of Systems  Constitutional: Negative for chills, fever and weight loss.  HENT: Negative for congestion, nosebleeds and tinnitus.   Eyes: Negative for blurred vision, double vision and redness.  Respiratory: Positive for shortness of breath. Negative for cough, hemoptysis and wheezing.   Cardiovascular: Positive for leg swelling. Negative for chest pain, orthopnea and PND.  Gastrointestinal: Negative for abdominal pain, diarrhea, melena, nausea and vomiting.  Genitourinary: Negative for dysuria, hematuria and urgency.  Musculoskeletal: Negative for falls and joint pain.  Neurological: Negative for dizziness, tingling, sensory change, focal weakness, seizures, weakness  and headaches.  Endo/Heme/Allergies: Negative for polydipsia. Does not bruise/bleed easily.  Psychiatric/Behavioral: Negative for depression and memory loss. The patient is not nervous/anxious.   All other systems reviewed and are negative.   MEDICATIONS AT HOME:   Prior to Admission medications   Medication Sig Start Date End Date Taking? Authorizing Provider  albuterol (PROVENTIL HFA;VENTOLIN HFA) 108 (90 Base) MCG/ACT inhaler Inhale 2 puffs every 6 (six) hours as needed into the lungs for wheezing or shortness of breath.   Yes [provider]  allopurinol (ZYLOPRIM) 100 MG tablet Take 1 tablet daily by mouth. 03/28/17 09/24/17 Yes [provider]  aspirin 81 MG tablet Take 81 mg daily by mouth.    Yes [provider]  budesonide-formoterol (SYMBICORT) 160-4.5 MCG/ACT inhaler Inhale 2 puffs into the lungs 2 (two) times daily.   Yes [provider]  cetirizine (ZYRTEC) 10 MG tablet Take 10 mg by mouth daily.   Yes [provider]  colchicine 0.6 MG tablet Take 0.6 mg by mouth daily.   Yes [provider]  Cyanocobalamin (VITAMIN B 12 PO) Take 1,000 mcg by mouth as directed.   Yes [provider]  ergocalciferol (VITAMIN D2) 50000 units capsule Take 50,000 Units by mouth once a week.   Yes [provider]  estradiol (ESTRACE) 0.1 MG/GM vaginal cream Place 1 Applicatorful vaginally 2 (two) times a week.   Yes [provider]  furosemide (LASIX) 20 MG tablet Take 80 mg daily by mouth.    Yes [provider]  gabapentin (NEURONTIN) 300 MG capsule Take 300 mg 3 (three) times daily by mouth.    Yes [provider]  Insulin Glargine (LANTUS SOLOSTAR) 100 UNIT/ML Solostar Pen Inject 60-70 Units 2 (two) times daily into the skin. 70 units in the Am, 60 units at PM   Yes [provider]  insulin lispro (HUMALOG) 100 UNIT/ML injection Inject 15-30 Units 3 (three) times daily before meals into the  skin. 25 units breafast/lunch 30 units dinner   Yes [provider]  Liraglutide (VICTOZA [place]) Inject 1.2 mg/mL at bedtime into the skin.    Yes [provider]  losartan (COZAAR) 100 MG tablet Take 1 tablet daily by mouth. 03/28/17 09/24/17 Yes [provider]  metoprolol tartrate (LOPRESSOR) 50 MG tablet Take 25 mg by mouth daily.   Yes [provider]  pantoprazole (PROTONIX) 40 MG tablet Take 40 mg 2 (two) times daily as needed by mouth.    Yes [provider]  rOPINIRole (REQUIP) 0.5 MG tablet Take 0.5 mg by mouth at bedtime.   Yes [provider]  sucralfate (CARAFATE) 1 g tablet Take 1 g by mouth 2 (two) times daily.   Yes [provider]  LORazepam (ATIVAN) 0.5 MG tablet Take 0.5 mg by mouth every 8 (eight) hours.    [provider]  nitroGLYCERIN (NITROSTAT) 0.4 MG SL  tablet Place 0.4 mg under the tongue every 5 (five) minutes as needed for chest pain.    [provider]      VITAL SIGNS:  Blood pressure (!) 167/64, pulse (!) 133, temperature 98.5 F (36.9 C), temperature source Oral, resp. rate (!) 29, height 5\' 2"  (1.575 m), weight 94.8 kg (209 lb), SpO2 94 %.  PHYSICAL EXAMINATION:  Physical Exam  GENERAL:  83 y.o.-year-old patient lying in the bed with no acute distress.  EYES: Pupils equal, round, reactive to light and accommodation. No scleral icterus. Extraocular muscles intact.  HEENT: Head atraumatic, normocephalic. Oropharynx and nasopharynx clear. No oropharyngeal erythema, moist oral mucosa  NECK:  Supple, no jugular venous distention. No thyroid enlargement, no tenderness.  LUNGS: Normal breath sounds bilaterally, no wheezing, rales, rhonchi. No use of accessory muscles of respiration.  CARDIOVASCULAR: S1, S2 Irregular. No murmurs, rubs, gallops, clicks.  ABDOMEN: Soft, nontender, nondistended. Bowel sounds present. No organomegaly or mass.  EXTREMITIES: +1-2 edema b/l, No cyanosis, or  clubbing. + 2 pedal & radial pulses b/l.   NEUROLOGIC: Cranial nerves II through XII are intact. No focal Motor or sensory deficits appreciated b/l PSYCHIATRIC: The patient is alert and oriented x 3. Good affect.  SKIN: No obvious rash, lesion, or ulcer.   LABORATORY PANEL:   CBC Recent Labs  Lab 05/25/17 1659  WBC 7.7  HGB 12.2  HCT 37.6  PLT 89*   ------------------------------------------------------------------------------------------------------------------  Chemistries  Recent Labs  Lab 05/25/17 1659  NA 141  K 3.4*  CL 104  CO2 27  GLUCOSE 91  BUN 31*  CREATININE 1.29*  CALCIUM 9.7   ------------------------------------------------------------------------------------------------------------------  Cardiac Enzymes Recent Labs  Lab 05/25/17 1659  TROPONINI 0.04*   ------------------------------------------------------------------------------------------------------------------  RADIOLOGY:  Dg Chest 2 View  Result Date: 05/25/2017 CLINICAL DATA:  Shortness of Breath.  Atrial fibrillation EXAM: CHEST  2 VIEW COMPARISON:  Chest CT November 11, 2014 and chest radiograph July 19, 2012 FINDINGS: There is cardiomegaly with pulmonary venous hypertension. There are small pleural effusions bilaterally. There is slight interstitial edema in the bases. There is no airspace consolidation. There is aortic atherosclerosis. Patient is status post internal mammary bypass grafting. No adenopathy. No evident bone lesions. There is calcification in each carotid artery. IMPRESSION: Pulmonary vascular congestion with small pleural effusions and slight bibasilar atelectasis. No airspace consolidation. There is aortic atherosclerosis. There is calcification in each carotid artery. Aortic Atherosclerosis (ICD10-I70.0). Electronically Signed   By: Lowella Grip III M.D.   On: 05/25/2017 16:37     IMPRESSION AND PLAN:   83 year old female with past medical history of essential  hypertension, hyperlipidemia, history of coronary artery disease, diabetes, diabetic neuropathy, osteoarthritis who presents to the hospital due to shortness of breath.  1. CHF-this is the cause of patient's worsening shortness of breath. Acute on chronic diastolic dysfunction. -We'll diurese the patient with IV Lasix, follow I's and O's and daily weights. -Continue Metoprolol, losartan. We'll get a cardiology consult, check echocardiogram.  2. New onset atrial fibrillation/flutter-currently rate controlled. We'll continue low-dose Metoprolol as mentioned. -We'll start the patient on a heparin drip. We'll get a cardiology consult, check echocardiogram.  3. Elevated troponin-in the setting of supply demand ischemia from the CHF and atrial fibrillation/flutter. -Observe on telemetry, cycle her cardiac markers.  4. Diabetes type 2 without complication-continue Lantus, NovoLog with meals. Continue carb-controlled diet. Follow blood sugars.  5. History of gout-no acute attack. Continue allopurinol, colchicine.  6. COPD-no acute exacerbation-continue Dulera, albuterol inhaler as  needed.  7. Restless leg syndrome-continue Requip.  8. GERD-continue Protonix.   All the records are reviewed and case discussed with ED provider. Management plans discussed with the patient, family and they are in agreement.  CODE STATUS: Full code  TOTAL TIME TAKING CARE OF THIS PATIENT: 45 minutes.    Henreitta Leber M.D on 05/25/2017 at 7:35 PM  Between 7am to 6pm - Pager - [phone]  After 6pm go to [email or web address] - password [email or web address] [facility]  Office  [phone]  CC: Primary care physician; Tracie Harrier, MD

## 2017-05-25 NOTE — ED Triage Notes (Addendum)
Pt has had SHOB last 2 days. Daughter reports that any exertion at all will cause her to become labored. Pt went to PCP and is new onset afib.  Unlabored at this time sitting in wheelchair.  Also had CP into left shoulder/arm

## 2017-05-25 NOTE — ED Provider Notes (Signed)
[facility] Emergency Department Provider Note    First MD Initiated Contact with Patient 05/25/17 1609     (approximate)  I have reviewed the triage vital signs and the nursing notes.   HISTORY  Chief Complaint Shortness of Breath    HPI Katherine Hernandez is a 83 y.o. female presents with several days of progressively worsening shortness of breath particularly of the last 2 days.  States that she becomes severely labored and short of breath with ambulation.  States she has been having intermittent left-sided chest pressure and pain.  Denies any fevers or cough but has noted worsening lower extremity swelling.  States she is also had an upset stomach so did not take her blood pressure medications today.  Went to see her PCP today who noted on EKG that she was in a new dysrhythmia.  She does have a history of COPD but does not smoke.  ECHO 04/19/17  INTERPRETATION NORMAL LEFT VENTRICULAR SYSTOLIC FUNCTION WITH MILD LVH NORMAL RIGHT VENTRICULAR SYSTOLIC FUNCTION MILD VALVULAR REGURGITATION (See above) NO VALVULAR STENOSIS Closest EF: >55% (Estimated) LVH: MILD LVH Aortic: TRIVIAL AR Mitral: MILD MR Tricuspid: MILD TR  Past Medical History:  Diagnosis Date  . Arthritis   . CAD (coronary artery disease)    Last nuclear 02/2009 EF 72% and neg for ischemia   . Coronary artery disease    CAD with PTCA(cfx)fx -1993,and CABG ,2004  . Diabetes mellitus without complication ([place])   . Diabetic nephropathy ([place])   . Diabetic peripheral neuropathy ([place])   . GERD (gastroesophageal reflux disease)   . Hyperlipidemia   . Hypertension   . Obesity   . Retinopathy, diabetic, background ([place])   . RLS (restless legs syndrome)   . Urinary incontinence    Family History  Problem Relation Age of Onset  . Breast cancer Sister 30  . Breast cancer Sister   . Diabetes Mother   . Heart disease Mother   . Kidney disease Father   . Hypertension Father    Past  Surgical History:  Procedure Laterality Date  . ABDOMINAL HYSTERECTOMY    . BREAST BIOPSY Left 1970   surgical exc  . BREAST BIOPSY Right 09/13/2016   path pending x 2 areas  . CHOLECYSTECTOMY  1968  . CORONARY ANGIOPLASTY    . CORONARY ARTERY BYPASS GRAFT  02/2003  . ery     There are no active problems to display for this patient.     Prior to Admission medications   Medication Sig Start Date End Date Taking? Authorizing Provider  albuterol (PROVENTIL HFA;VENTOLIN HFA) 108 (90 Base) MCG/ACT inhaler Inhale 2 puffs every 6 (six) hours as needed into the lungs for wheezing or shortness of breath.   Yes [provider]  allopurinol (ZYLOPRIM) 100 MG tablet Take 1 tablet daily by mouth. 03/28/17 09/24/17 Yes [provider]  aspirin 81 MG tablet Take 81 mg daily by mouth.    Yes [provider]  budesonide-formoterol (SYMBICORT) 160-4.5 MCG/ACT inhaler Inhale 2 puffs into the lungs 2 (two) times daily.   Yes [provider]  cetirizine (ZYRTEC) 10 MG tablet Take 10 mg by mouth daily.   Yes [provider]  colchicine 0.6 MG tablet Take 0.6 mg by mouth daily.   Yes [provider]  Cyanocobalamin (VITAMIN B 12 PO) Take 1,000 mcg by mouth as directed.   Yes [provider]  ergocalciferol (VITAMIN D2) 50000 units capsule Take 50,000 Units by  mouth once a week.   Yes [provider]  estradiol (ESTRACE) 0.1 MG/GM vaginal cream Place 1 Applicatorful vaginally 2 (two) times a week.   Yes [provider]  furosemide (LASIX) 20 MG tablet Take 80 mg daily by mouth.    Yes [provider]  gabapentin (NEURONTIN) 300 MG capsule Take 300 mg 3 (three) times daily by mouth.    Yes [provider]  Insulin Glargine (LANTUS SOLOSTAR) 100 UNIT/ML Solostar Pen Inject 60-70 Units 2 (two) times daily into the skin. 70 units in the Am, 60 units at PM   Yes [provider]  insulin lispro (HUMALOG) 100  UNIT/ML injection Inject 15-30 Units 3 (three) times daily before meals into the skin. 25 units breafast/lunch 30 units dinner   Yes [provider]  Liraglutide (VICTOZA [place]) Inject 1.2 mg/mL at bedtime into the skin.    Yes [provider]  losartan (COZAAR) 100 MG tablet Take 1 tablet daily by mouth. 03/28/17 09/24/17 Yes [provider]  metoprolol tartrate (LOPRESSOR) 50 MG tablet Take 25 mg by mouth daily.   Yes [provider]  pantoprazole (PROTONIX) 40 MG tablet Take 40 mg 2 (two) times daily as needed by mouth.    Yes [provider]  rOPINIRole (REQUIP) 0.5 MG tablet Take 0.5 mg by mouth at bedtime.   Yes [provider]  sucralfate (CARAFATE) 1 g tablet Take 1 g by mouth 2 (two) times daily.   Yes [provider]  LORazepam (ATIVAN) 0.5 MG tablet Take 0.5 mg by mouth every 8 (eight) hours.    [provider]  nitroGLYCERIN (NITROSTAT) 0.4 MG SL tablet Place 0.4 mg under the tongue every 5 (five) minutes as needed for chest pain.    [provider]    Allergies Amoxicillin; Cefdinir; Doxycycline; Erythromycin; Lisinopril; Macrodantin [nitrofurantoin]; Mobic [meloxicam]; Statins; and Sulfa antibiotics    Social History Social History   Tobacco Use  . Smoking status: Former Smoker    Packs/day: 1.00    Years: 15.00    Pack years: 15.00    Types: Cigarettes    Last attempt to quit: 07/17/1978    Years since quitting: 38.8  . Smokeless tobacco: Never Used  . Tobacco comment: quit in year 1980  Substance Use Topics  . Alcohol use: No  . Drug use: No    Review of Systems Patient denies headaches, rhinorrhea, blurry vision, numbness, shortness of breath, chest pain, edema, cough, abdominal pain, nausea, vomiting, diarrhea, dysuria, fevers, rashes or hallucinations unless otherwise stated above in HPI. ____________________________________________   PHYSICAL EXAM:  VITAL SIGNS: Vitals:   05/25/17  1818 05/25/17 1819  BP:    Pulse:    Resp:    Temp:    SpO2: 94% 94%    Constitutional: Alert and oriented.  Mild respiratory distress eyes: Conjunctivae are normal.  Head: Atraumatic. Nose: No congestion/rhinnorhea. Mouth/Throat: Mucous membranes are moist.   Neck: No stridor. Painless ROM.  Cardiovascular: Irregularly irregular rhythm. Grossly normal heart sounds.  Good peripheral circulation. Respiratory: Patient is tachypneic, using accessory muscles, speaking in short phrases with inspiratory crackles in posterior lung fields. Gastrointestinal: Soft and nontender. No distention. No abdominal bruits. No CVA tenderness. Genitourinary:  Musculoskeletal: No lower extremity tenderness , + BLE edema.  No joint effusions. Neurologic:  Normal speech and language. No gross focal neurologic deficits are appreciated. No facial droop Skin:  Skin is warm, dry and intact. No rash noted. Psychiatric: Mood and affect  are normal. Speech and behavior are normal.  ____________________________________________   LABS (all labs ordered are listed, but only abnormal results are displayed)  Results for orders placed or performed during the hospital encounter of 05/25/17 (from the past 24 hour(s))  Basic metabolic panel     Status: Abnormal   Collection Time: 05/25/17  4:59 PM  Result Value Ref Range   Sodium 141 135 - 145 mmol/L   Potassium 3.4 (L) 3.5 - 5.1 mmol/L   Chloride 104 101 - 111 mmol/L   CO2 27 22 - 32 mmol/L   Glucose, Bld 91 65 - 99 mg/dL   BUN 31 (H) 6 - 20 mg/dL   Creatinine, Ser 1.29 (H) 0.44 - 1.00 mg/dL   Calcium 9.7 8.9 - 10.3 mg/dL   GFR calc non Af Amer 37 (L) >60 mL/min   GFR calc Af Amer 43 (L) >60 mL/min   Anion gap 10 5 - 15  CBC     Status: Abnormal   Collection Time: 05/25/17  4:59 PM  Result Value Ref Range   WBC 7.7 3.6 - 11.0 K/uL   RBC 4.02 3.80 - 5.20 MIL/uL   Hemoglobin 12.2 12.0 - 16.0 g/dL   HCT 37.6 35.0 - 47.0 %   MCV 93.6 80.0 - 100.0 fL   MCH 30.5  26.0 - 34.0 pg   MCHC 32.6 32.0 - 36.0 g/dL   RDW 15.3 (H) 11.5 - 14.5 %   Platelets 89 (L) 150 - 440 K/uL  Troponin I     Status: Abnormal   Collection Time: 05/25/17  4:59 PM  Result Value Ref Range   Troponin I 0.04 (HH) <0.03 ng/mL   ____________________________________________  EKG My review and personal interpretation at Time: 16:04   Indication: chest pain  Rate: 95  Rhythm: aflutter with variable conduction Axis: normal Other: st depressions in I II and lateral leads, normal intervals ____________________________________________  RADIOLOGY  I personally reviewed all radiographic images ordered to evaluate for the above acute complaints and reviewed radiology reports and findings.  These findings were personally discussed with the patient.  Please see medical record for radiology report.  ____________________________________________   PROCEDURES  Procedure(s) performed:  Procedures    Critical Care performed: no ____________________________________________   INITIAL IMPRESSION / ASSESSMENT AND PLAN / ED COURSE  Pertinent labs & imaging results that were available during my care of the patient were reviewed by me and considered in my medical decision making (see chart for details).  DDX: Asthma, copd, CHF, pna, ptx, malignancy, Pe, anemia   Tattiana Fakhouri Bonn is a 83 y.o. who presents to the ED with shortness of breath as described above.  Given her presentation and physical exam findings I am concern for heart failure.  She is afebrile making pneumonia less likely.  EKG does show some new onset a flutter with nonspecific ST changes.  Given her history of CAD will further risk stratify with troponin.  Will treat blood pressure with nitroglycerin and IV antihypertensives.  The patient will be placed on continuous pulse oximetry and telemetry for monitoring.  Laboratory evaluation will be sent to evaluate for the above complaints.     Patient with mildly elevated  troponin.  Currently rate controlled at 92.  Given IV Lasix for her acute heart failure.  At this point do feel the patient will require admission the hospital she is significantly short of breath markedly tachypneic with use of accessory muscles with exertion.  We will continue to  monitor.     ____________________________________________   FINAL CLINICAL IMPRESSION(S) / ED DIAGNOSES  Final diagnoses:  Acute pulmonary edema ([place])  Hypertensive urgency      NEW MEDICATIONS STARTED DURING THIS VISIT:  This SmartLink is deprecated. Use AVSMEDLIST instead to display the medication list for a patient.   Note:  This document was prepared using Dragon voice recognition software and may include unintentional dictation errors.    Merlyn Lot, MD 05/25/17 1924

## 2017-05-25 NOTE — ED Notes (Signed)
Pt transport to 253

## 2017-05-25 NOTE — ED Triage Notes (Signed)
Pt becoming labored just talking with RN

## 2017-05-25 NOTE — Progress Notes (Signed)
ANTICOAGULATION CONSULT NOTE - Initial Consult  Pharmacy Consult for Heparin  Indication: atrial fibrillation  Allergies  Allergen Reactions  . Amoxicillin   . Cefdinir   . Doxycycline   . Erythromycin   . Lisinopril   . Macrodantin [Nitrofurantoin]   . Mobic [Meloxicam]     diarrhea  . Statins   . Sulfa Antibiotics     Patient Measurements: Height: 5\' 2"  (157.5 cm) Weight: 201 lb 4.8 oz (91.3 kg) IBW/kg (Calculated) : 50.1 Heparin Dosing Weight: 71.2 kg   Vital Signs: Temp: 99.2 F (37.3 C) (11/09 2103) Temp Source: Oral (11/09 2103) BP: 169/58 (11/09 2103) Pulse Rate: 97 (11/09 2103)  Labs: Recent Labs    05/25/17 1659  HGB 12.2  HCT 37.6  PLT 89*  CREATININE 1.29*  TROPONINI 0.04*    Estimated Creatinine Clearance: 33.5 mL/min (A) (by C-G formula based on SCr of 1.29 mg/dL (H)).   Medical History: Past Medical History:  Diagnosis Date  . Arthritis   . CAD (coronary artery disease)    Last nuclear 02/2009 EF 72% and neg for ischemia   . Coronary artery disease    CAD with PTCA(cfx)fx -1993,and CABG ,2004  . Diabetes mellitus without complication ([place])   . Diabetic nephropathy ([place])   . Diabetic peripheral neuropathy ([place])   . GERD (gastroesophageal reflux disease)   . Hyperlipidemia   . Hypertension   . Obesity   . Retinopathy, diabetic, background ([place])   . RLS (restless legs syndrome)   . Urinary incontinence     Medications:  Medications Prior to Admission  Medication Sig Dispense Refill Last Dose  . albuterol (PROVENTIL HFA;VENTOLIN HFA) 108 (90 Base) MCG/ACT inhaler Inhale 2 puffs every 6 (six) hours as needed into the lungs for wheezing or shortness of breath.   prn at prn  . allopurinol (ZYLOPRIM) 100 MG tablet Take 1 tablet daily by mouth.   05/24/2017 at Unknown time  . aspirin 81 MG tablet Take 81 mg daily by mouth.    05/24/2017 at Unknown time  . budesonide-formoterol (SYMBICORT) 160-4.5 MCG/ACT inhaler Inhale 2 puffs into the lungs 2  (two) times daily.   05/24/2017 at Unknown time  . cetirizine (ZYRTEC) 10 MG tablet Take 10 mg by mouth daily.   05/24/2017 at Unknown time  . colchicine 0.6 MG tablet Take 0.6 mg by mouth daily.   prn at prn  . Cyanocobalamin (VITAMIN B 12 PO) Take 1,000 mcg by mouth as directed.   05/24/2017 at Unknown time  . ergocalciferol (VITAMIN D2) 50000 units capsule Take 50,000 Units by mouth once a week.   05/24/2017 at Unknown time  . estradiol (ESTRACE) 0.1 MG/GM vaginal cream Place 1 Applicatorful vaginally 2 (two) times a week.   05/24/2017 at Unknown time  . furosemide (LASIX) 20 MG tablet Take 80 mg daily by mouth.    05/24/2017 at Unknown time  . gabapentin (NEURONTIN) 300 MG capsule Take 300 mg 3 (three) times daily by mouth.    05/24/2017 at Unknown time  . Insulin Glargine (LANTUS SOLOSTAR) 100 UNIT/ML Solostar Pen Inject 60-70 Units 2 (two) times daily into the skin. 70 units in the Am, 60 units at PM   05/25/2017 at Unknown time  . insulin lispro (HUMALOG) 100 UNIT/ML injection Inject 15-30 Units 3 (three) times daily before meals into the skin. 25 units breafast/lunch 30 units dinner   05/25/2017 at Unknown time  . Liraglutide (VICTOZA [place]) Inject 1.2 mg/mL at bedtime into the skin.  05/24/2017 at Unknown time  . losartan (COZAAR) 100 MG tablet Take 1 tablet daily by mouth.   05/24/2017 at Unknown time  . metoprolol tartrate (LOPRESSOR) 50 MG tablet Take 25 mg by mouth daily.   05/24/2017 at Unknown time  . pantoprazole (PROTONIX) 40 MG tablet Take 40 mg 2 (two) times daily as needed by mouth.    05/24/2017 at Unknown time  . rOPINIRole (REQUIP) 0.5 MG tablet Take 0.5 mg by mouth at bedtime.   05/24/2017 at Unknown time  . sucralfate (CARAFATE) 1 g tablet Take 1 g by mouth 2 (two) times daily.   05/24/2017 at Unknown time  . LORazepam (ATIVAN) 0.5 MG tablet Take 0.5 mg by mouth every 8 (eight) hours.   prn at prn  . nitroGLYCERIN (NITROSTAT) 0.4 MG SL tablet Place 0.4 mg under the tongue every 5 (five)  minutes as needed for chest pain.   prn at prn    Assessment: Pharmacy consulted to dose heparin in this 83 year old female with Afib.  No prior anticoag noted.  CrCl = 33.5 ml/min   Goal of Therapy:  Heparin level 0.3-0.7 units/ml Monitor platelets by anticoagulation protocol: Yes    Plan:  Give 4300  units bolus x 1 Start heparin infusion at 1000 units/hr Check anti-Xa level in 8 hours and daily while on heparin Continue to monitor H&H and platelets  Dontrez Pettis D 05/25/2017,9:56 PM

## 2017-05-26 ENCOUNTER — Inpatient Hospital Stay
Admit: 2017-05-26 | Discharge: 2017-05-26 | Disposition: A | Discharge status: Home / Self Care | Payer: Medicare Other | Attending: Specialist | Admitting: Specialist

## 2017-05-26 LAB — ECHOCARDIOGRAM COMPLETE
AOPV: 0.56 m/s
AOVTI: 34.3 cm
AV Area VTI index: 0.59 cm2/m2
AV Area mean vel: 1.29 cm2
AV Peak grad: 9 mmHg
AV peak Index: 0.63
AV vel: 1.21
AVAREAMEANVIN: 0.63 cm2/m2
AVAREAVTI: 1.28 cm2
AVCELMEANRAT: 0.57
AVG: 5 mmHg
AVPKVEL: 151 cm/s
Area-P 1/2: 5.24 cm2
CHL CUP AV VALUE AREA INDEX: 0.59
DOP CAL AO MEAN VELOCITY: 109 cm/s
EERAT: 23.17
EWDT: 144 ms
FS: 36 % (ref 28–44)
HEIGHTINCHES: 62 in
IV/PV OW: 1.14
LA ID, A-P, ES: 46 mm
LA vol index: 30.8 mL/m2
LA vol: 62.9 mL
LADIAMINDEX: 2.25 cm/m2
LAVOLA4C: 52.2 mL
LEFT ATRIUM END SYS DIAM: 46 mm
LV E/e' medial: 23.17
LV TDI E'MEDIAL: 7.94
LVEEAVG: 23.17
LVOT SV: 42 mL
LVOT VTI: 18.3 cm
LVOT area: 2.27 cm2
LVOT diameter: 17 mm
LVOTPV: 85.2 cm/s
LVOTVTI: 0.53 cm
MV Dec: 144
MV pk A vel: 109 m/s
MVPG: 14 mmHg
MVPKEVEL: 184 m/s
MVSPHT: 42 ms
PW: 10.7 mm — AB (ref 0.6–1.1)
RV LATERAL S' VELOCITY: 8.16 cm/s
TAPSE: 13.9 mm
Valve area: 1.21 cm2
Weight: 3220.8 oz

## 2017-05-26 LAB — HEMOGLOBIN A1C
HEMOGLOBIN A1C: 7.7 % — AB (ref 4.8–5.6)
MEAN PLASMA GLUCOSE: 174.29 mg/dL

## 2017-05-26 LAB — BASIC METABOLIC PANEL
Anion gap: 9 (ref 5–15)
BUN: 32 mg/dL — AB (ref 6–20)
CO2: 26 mmol/L (ref 22–32)
CREATININE: 1.36 mg/dL — AB (ref 0.44–1.00)
Calcium: 9.2 mg/dL (ref 8.9–10.3)
Chloride: 105 mmol/L (ref 101–111)
GFR calc Af Amer: 40 mL/min — ABNORMAL LOW (ref >60)
GFR, EST NON AFRICAN AMERICAN: 34 mL/min — AB (ref >60)
GLUCOSE: 113 mg/dL — AB (ref 65–99)
POTASSIUM: 3.6 mmol/L (ref 3.5–5.1)
Sodium: 140 mmol/L (ref 135–145)

## 2017-05-26 LAB — CBC
HEMATOCRIT: 34.9 % — AB (ref 35.0–47.0)
Hemoglobin: 11.7 g/dL — ABNORMAL LOW (ref 12.0–16.0)
MCH: 31.6 pg (ref 26.0–34.0)
MCHC: 33.4 g/dL (ref 32.0–36.0)
MCV: 94.6 fL (ref 80.0–100.0)
Platelets: 84 10*3/uL — ABNORMAL LOW (ref 150–440)
RBC: 3.69 MIL/uL — ABNORMAL LOW (ref 3.80–5.20)
RDW: 15.3 % — AB (ref 11.5–14.5)
WBC: 7.2 10*3/uL (ref 3.6–11.0)

## 2017-05-26 LAB — GLUCOSE, CAPILLARY
GLUCOSE-CAPILLARY: 116 mg/dL — AB (ref 65–99)
GLUCOSE-CAPILLARY: 64 mg/dL — AB (ref 65–99)
Glucose-Capillary: 163 mg/dL — ABNORMAL HIGH (ref 65–99)
Glucose-Capillary: 186 mg/dL — ABNORMAL HIGH (ref 65–99)
Glucose-Capillary: 187 mg/dL — ABNORMAL HIGH (ref 65–99)

## 2017-05-26 LAB — HEPARIN LEVEL (UNFRACTIONATED)
HEPARIN UNFRACTIONATED: 2.28 [IU]/mL — AB (ref 0.30–0.70)
Heparin Unfractionated: 0.14 IU/mL — ABNORMAL LOW (ref 0.30–0.70)

## 2017-05-26 LAB — TROPONIN I
TROPONIN I: 0.05 ng/mL — AB (ref <0.03)
Troponin I: 0.05 ng/mL (ref <0.03)

## 2017-05-26 LAB — MAGNESIUM: Magnesium: 2.2 mg/dL (ref 1.7–2.4)

## 2017-05-26 MED ORDER — METOPROLOL TARTRATE 50 MG PO TABS
50.0000 mg | ORAL_TABLET | Freq: Every day | ORAL | Status: DC
  Administered 2017-05-27: 50 mg via ORAL
  Filled 2017-05-26: qty 1

## 2017-05-26 MED ORDER — INSULIN GLARGINE 100 UNIT/ML ~~LOC~~ SOLN
60.0000 [IU] | Freq: Every day | SUBCUTANEOUS | Status: DC
  Filled 2017-05-26 (×2): qty 0.6

## 2017-05-26 MED ORDER — INSULIN GLARGINE 100 UNIT/ML ~~LOC~~ SOLN
70.0000 [IU] | Freq: Every day | SUBCUTANEOUS | Status: DC
  Administered 2017-05-27: 70 [IU] via SUBCUTANEOUS
  Filled 2017-05-26: qty 0.7

## 2017-05-26 MED ORDER — APIXABAN 5 MG PO TABS
5.0000 mg | ORAL_TABLET | Freq: Two times a day (BID) | ORAL | Status: DC
  Administered 2017-05-26 – 2017-05-27 (×3): 5 mg via ORAL
  Filled 2017-05-26 (×3): qty 1

## 2017-05-26 MED ORDER — INSULIN ASPART 100 UNIT/ML ~~LOC~~ SOLN: 0.0000 [IU] | Freq: Every day | SUBCUTANEOUS | Status: DC

## 2017-05-26 MED ORDER — HEPARIN BOLUS VIA INFUSION
2100.0000 [IU] | Freq: Once | INTRAVENOUS | Status: AC
  Administered 2017-05-26: 2100 [IU] via INTRAVENOUS
  Filled 2017-05-26: qty 2100

## 2017-05-26 MED ORDER — LORAZEPAM 0.5 MG PO TABS
0.5000 mg | ORAL_TABLET | Freq: Every evening | ORAL | Status: DC | PRN
  Administered 2017-05-26: 0.5 mg via ORAL
  Filled 2017-05-26: qty 1

## 2017-05-26 MED ORDER — HEPARIN (PORCINE) IN NACL 100-0.45 UNIT/ML-% IJ SOLN
1250.0000 [IU]/h | INTRAMUSCULAR | Status: DC
  Administered 2017-05-26: 1250 [IU]/h via INTRAVENOUS

## 2017-05-26 MED ORDER — SODIUM CHLORIDE 0.9% FLUSH
3.0000 mL | Freq: Two times a day (BID) | INTRAVENOUS | Status: DC
  Administered 2017-05-26 – 2017-05-27 (×2): 3 mL via INTRAVENOUS

## 2017-05-26 MED ORDER — INSULIN ASPART 100 UNIT/ML ~~LOC~~ SOLN
0.0000 [IU] | Freq: Three times a day (TID) | SUBCUTANEOUS | Status: DC
Start: 2017-05-26 — End: 2017-05-27
  Administered 2017-05-26 – 2017-05-27 (×2): 2 [IU] via SUBCUTANEOUS
  Filled 2017-05-26 (×2): qty 1

## 2017-05-26 NOTE — Plan of Care (Signed)
  Progressing Education: Knowledge of General Education information will improve 05/26/2017 0845 - Progressing by Jeri Cos, RN Clinical Measurements: Respiratory complications will improve 05/26/2017 0845 - Progressing by Jeri Cos, RN Activity: Risk for activity intolerance will decrease 05/26/2017 0845 - Progressing by Jeri Cos, RN Activity: Capacity to carry out activities will improve 05/26/2017 0845 - Progressing by Jeri Cos, RN

## 2017-05-26 NOTE — Progress Notes (Signed)
ANTICOAGULATION CONSULT NOTE - Follow Up Consult  Pharmacy Consult for Heparin  Indication: atrial fibrillation  Allergies  Allergen Reactions  . Amoxicillin   . Cefdinir   . Doxycycline   . Erythromycin   . Lisinopril   . Macrodantin [Nitrofurantoin]   . Mobic [Meloxicam]     diarrhea  . Statins   . Sulfa Antibiotics     Patient Measurements: Height: 5\' 2"  (157.5 cm) Weight: 201 lb 4.8 oz (91.3 kg) IBW/kg (Calculated) : 50.1 Heparin Dosing Weight: 71.2 kg   Vital Signs: Temp: 98.9 F (37.2 C) (11/10 0506) Temp Source: Oral (11/10 0506) BP: 140/50 (11/10 0506) Pulse Rate: 91 (11/10 0506)  Labs: Recent Labs    05/25/17 1659 05/26/17 0103 05/26/17 0443 05/26/17 0702  HGB 12.2  --  11.7*  --   HCT 37.6  --  34.9*  --   PLT 89*  --  84*  --   HEPARINUNFRC  --   --   --  0.14*  CREATININE 1.29*  --  1.36*  --   TROPONINI 0.04* 0.05* 0.05*  --     Estimated Creatinine Clearance: 31.8 mL/min (A) (by C-G formula based on SCr of 1.36 mg/dL (H)).   Medical History: Past Medical History:  Diagnosis Date  . Arthritis   . CAD (coronary artery disease)    Last nuclear 02/2009 EF 72% and neg for ischemia   . Coronary artery disease    CAD with PTCA(cfx)fx -1993,and CABG ,2004  . Diabetes mellitus without complication ([place])   . Diabetic nephropathy ([place])   . Diabetic peripheral neuropathy ([place])   . GERD (gastroesophageal reflux disease)   . Hyperlipidemia   . Hypertension   . Obesity   . Retinopathy, diabetic, background ([place])   . RLS (restless legs syndrome)   . Urinary incontinence     Medications:  Medications Prior to Admission  Medication Sig Dispense Refill Last Dose  . albuterol (PROVENTIL HFA;VENTOLIN HFA) 108 (90 Base) MCG/ACT inhaler Inhale 2 puffs every 6 (six) hours as needed into the lungs for wheezing or shortness of breath.   prn at prn  . allopurinol (ZYLOPRIM) 100 MG tablet Take 1 tablet daily by mouth.   05/24/2017 at Unknown time  . aspirin  81 MG tablet Take 81 mg daily by mouth.    05/24/2017 at Unknown time  . budesonide-formoterol (SYMBICORT) 160-4.5 MCG/ACT inhaler Inhale 2 puffs into the lungs 2 (two) times daily.   05/24/2017 at Unknown time  . cetirizine (ZYRTEC) 10 MG tablet Take 10 mg by mouth daily.   05/24/2017 at Unknown time  . colchicine 0.6 MG tablet Take 0.6 mg by mouth daily.   prn at prn  . Cyanocobalamin (VITAMIN B 12 PO) Take 1,000 mcg by mouth as directed.   05/24/2017 at Unknown time  . ergocalciferol (VITAMIN D2) 50000 units capsule Take 50,000 Units by mouth once a week.   05/24/2017 at Unknown time  . estradiol (ESTRACE) 0.1 MG/GM vaginal cream Place 1 Applicatorful vaginally 2 (two) times a week.   05/24/2017 at Unknown time  . furosemide (LASIX) 20 MG tablet Take 80 mg daily by mouth.    05/24/2017 at Unknown time  . gabapentin (NEURONTIN) 300 MG capsule Take 300 mg 3 (three) times daily by mouth.    05/24/2017 at Unknown time  . Insulin Glargine (LANTUS SOLOSTAR) 100 UNIT/ML Solostar Pen Inject 60-70 Units 2 (two) times daily into the skin. 70 units in the Am, 60 units at PM  05/25/2017 at Unknown time  . insulin lispro (HUMALOG) 100 UNIT/ML injection Inject 15-30 Units 3 (three) times daily before meals into the skin. 25 units breafast/lunch 30 units dinner   05/25/2017 at Unknown time  . Liraglutide (VICTOZA [place]) Inject 1.2 mg/mL at bedtime into the skin.    05/24/2017 at Unknown time  . losartan (COZAAR) 100 MG tablet Take 1 tablet daily by mouth.   05/24/2017 at Unknown time  . metoprolol tartrate (LOPRESSOR) 50 MG tablet Take 25 mg by mouth daily.   05/24/2017 at Unknown time  . pantoprazole (PROTONIX) 40 MG tablet Take 40 mg 2 (two) times daily as needed by mouth.    05/24/2017 at Unknown time  . rOPINIRole (REQUIP) 0.5 MG tablet Take 0.5 mg by mouth at bedtime.   05/24/2017 at Unknown time  . sucralfate (CARAFATE) 1 g tablet Take 1 g by mouth 2 (two) times daily.   05/24/2017 at Unknown time  . LORazepam (ATIVAN)  0.5 MG tablet Take 0.5 mg by mouth every 8 (eight) hours.   prn at prn  . nitroGLYCERIN (NITROSTAT) 0.4 MG SL tablet Place 0.4 mg under the tongue every 5 (five) minutes as needed for chest pain.   prn at prn    Assessment: Pharmacy consulted to dose heparin in this 83 year old female with Afib.  No prior anticoag noted.  CrCl = 31.8 ml/min   Goal of Therapy:  Heparin level 0.3-0.7 units/ml Monitor platelets by anticoagulation protocol: Yes    Plan:  Give 4300  units bolus x 1 Start heparin infusion at 1000 units/hr Check anti-Xa level in 8 hours and daily while on heparin Continue to monitor H&H and platelets    11/10 ~ 07:30  HL = 0.14.  Ordered a bolus of 2100 units and increased drip rate to 1250 units/hr.   Next HL due tonight at 16:00.    Katherine Hernandez K, RPH 05/26/2017,7:43 AM

## 2017-05-26 NOTE — Progress Notes (Signed)
[facility] [place] NAME: Katherine Hernandez    MR#:  [id number]  DATE OF BIRTH:  [birth year]  SUBJECTIVE:  CHIEF COMPLAINT:   Chief Complaint  Patient presents with  . Shortness of Breath   Still has cough, shortness of breath and wheezing. REVIEW OF SYSTEMS:  Review of Systems  Constitutional: Negative for chills, fever and malaise/fatigue.  HENT: Negative for sore throat.   Eyes: Negative for blurred vision and double vision.  Respiratory: Positive for cough, shortness of breath and wheezing. Negative for hemoptysis and stridor.   Cardiovascular: Negative for chest pain, palpitations, orthopnea and leg swelling.  Gastrointestinal: Negative for abdominal pain, blood in stool, diarrhea, melena, nausea and vomiting.  Genitourinary: Negative for dysuria, flank pain and hematuria.  Musculoskeletal: Negative for back pain and joint pain.  Skin: Negative for rash.  Neurological: Negative for dizziness, sensory change, focal weakness, seizures, loss of consciousness, weakness and headaches.  Endo/Heme/Allergies: Negative for polydipsia.  Psychiatric/Behavioral: Negative for depression. The patient is not nervous/anxious.     DRUG ALLERGIES:   Allergies  Allergen Reactions  . Amoxicillin   . Cefdinir   . Doxycycline   . Erythromycin   . Lisinopril   . Macrodantin [Nitrofurantoin]   . Mobic [Meloxicam]     diarrhea  . Statins   . Sulfa Antibiotics    VITALS:  Blood pressure (!) 159/61, pulse 84, temperature 98.4 F (36.9 C), temperature source Oral, resp. rate 18, height 5\' 2"  (1.575 m), weight 201 lb 4.8 oz (91.3 kg), SpO2 96 %. PHYSICAL EXAMINATION:  Physical Exam  Constitutional: She is oriented to person, place, and time and well-developed, well-nourished, and in no distress.  Morbid obesity.  HENT:  Head: Normocephalic.  Mouth/Throat: Oropharynx is clear and moist.  Eyes: Conjunctivae and EOM are normal. Pupils are equal,  round, and reactive to light. No scleral icterus.  Neck: Normal range of motion. Neck supple. No JVD present. No tracheal deviation present.  Cardiovascular: Normal rate, regular rhythm and normal heart sounds. Exam reveals no gallop.  No murmur heard. Pulmonary/Chest: Effort normal. No respiratory distress. She has wheezes. She has rales.  Abdominal: Soft. Bowel sounds are normal. She exhibits no distension. There is no tenderness. There is no rebound.  Musculoskeletal: Normal range of motion. She exhibits edema. She exhibits no tenderness.  Neurological: She is alert and oriented to person, place, and time. No cranial nerve deficit.  Skin: No rash noted. No erythema.  Psychiatric: Affect normal.   LABORATORY PANEL:  Female CBC Recent Labs  Lab 05/26/17 0443  WBC 7.2  HGB 11.7*  HCT 34.9*  PLT 84*   ------------------------------------------------------------------------------------------------------------------ Chemistries  Recent Labs  Lab 05/26/17 0443  NA 140  K 3.6  CL 105  CO2 26  GLUCOSE 113*  BUN 32*  CREATININE 1.36*  CALCIUM 9.2  MG 2.2   RADIOLOGY:  Dg Chest 2 View  Result Date: 05/25/2017 CLINICAL DATA:  Shortness of Breath.  Atrial fibrillation EXAM: CHEST  2 VIEW COMPARISON:  Chest CT November 11, 2014 and chest radiograph July 19, 2012 FINDINGS: There is cardiomegaly with pulmonary venous hypertension. There are small pleural effusions bilaterally. There is slight interstitial edema in the bases. There is no airspace consolidation. There is aortic atherosclerosis. Patient is status post internal mammary bypass grafting. No adenopathy. No evident bone lesions. There is calcification in each carotid artery. IMPRESSION: Pulmonary vascular congestion with small pleural effusions and slight bibasilar atelectasis. No airspace  consolidation. There is aortic atherosclerosis. There is calcification in each carotid artery. Aortic Atherosclerosis (ICD10-I70.0).  Electronically Signed   By: Lowella Grip III M.D.   On: 05/25/2017 16:37   ASSESSMENT AND PLAN:   83 year old female with past medical history of essential hypertension, hyperlipidemia, history of coronary artery disease, diabetes, diabetic neuropathy, osteoarthritis who presents to the hospital due to shortness of breath.  1. Acute on chronic diastolic CHF, LV EF: 21% -   65% Continue IV Lasix, follow I's and O's and daily weights. -Continue Metoprolol, losartan.   2. New onset atrial fibrillation/flutter-currently rate controlled.  Started Eliquis and discontinued heparin drip per Dr. Saralyn Pilar.  3. Elevated troponin-in the setting of supply demand ischemia from the CHF and atrial fibrillation/flutter.  4. Diabetes type 2 without complication-continue Lantus, NovoLog with meals. Continue carb-controlled diet.  5. History of gout-no acute attack. Continue allopurinol.  6. COPD-no acute exacerbation-continue Dulera, albuterol inhaler as needed.  7. Restless leg syndrome-continue Requip.  8. GERD-continue Protonix.  Acute renal failure.  Possible due to diuretics.  Follow-up BMP while on Lasix.  Discussed with Dr. Saralyn Pilar. All the records are reviewed and case discussed with Care Management/Social Worker. Management plans discussed with the patient, her daughter and they are in agreement.  CODE STATUS: Full Code  TOTAL TIME TAKING CARE OF THIS PATIENT: 35 minutes.   More than 50% of the time was spent in counseling/coordination of care: YES  POSSIBLE D/C IN 2 DAYS, DEPENDING ON CLINICAL CONDITION.   Demetrios Loll M.D on 05/26/2017 at 2:17 PM  Between 7am to 6pm - Pager - [phone]  After 6pm go to [email or web address] - [facility]

## 2017-05-26 NOTE — Progress Notes (Signed)
Patient has 15 units of novolog scheduled and sliding scale ordered. Blood sugar 163 order clarified with Dr. Bridgett Larsson stated to go ahead and give.

## 2017-05-26 NOTE — Plan of Care (Signed)
  Clinical Measurements: Respiratory complications will improve 05/26/2017 2038 - Progressing by Marylouise Stacks, RN

## 2017-05-26 NOTE — Consult Note (Signed)
[facility]  CARDIOLOGY CONSULT NOTE  Patient ID: Katherine Hernandez MRN: [id number] DOB/AGE: 09/10/1929 83 y.o.  Admit date: 05/25/2017 Referring Physician Verdell Carmine Primary Physician [facility] Primary Cardiologist Fath Reason for Consultation atrial flutter and congestive heart failure  HPI: 83 year old female referred for evaluation of atrial flutter and congestive heart failure.  The patient has known history of coronary disease, status post PTCA and CABG in 2004 at [facility].  The patient's had a 73-month history of persistent pedal edema.  She has a history of sleep apnea, recently started on CPAP with difficulty.  She has a 2-3-week history of increasing shortness of breath, presented to [facility] emergency room where chest x-ray revealed pulmonary edema, and ECG revealed atrial fibrillation/atrial flutter with variable rate.  The patient was treated with intravenous furosemide, with diuresis and overall clinical improvement.  She remains in atrial fibrillation atrial flutter with controlled rate 70-80 bpm.  Patient denies chest pain or shortness of breath, and feels much better.  Admission labs were notable for borderline elevated troponin at 0.05, 0.05.  Review of systems complete and found to be negative unless listed above     Past Medical History:  Diagnosis Date  . Arthritis   . CAD (coronary artery disease)    Last nuclear 02/2009 EF 72% and neg for ischemia   . Coronary artery disease    CAD with PTCA(cfx)fx -1993,and CABG ,2004  . Diabetes mellitus without complication ([place])   . Diabetic nephropathy ([place])   . Diabetic peripheral neuropathy ([place])   . GERD (gastroesophageal reflux disease)   . Hyperlipidemia   . Hypertension   . Obesity   . Retinopathy, diabetic, background ([place])   . RLS (restless legs syndrome)   . Urinary incontinence     Past Surgical History:  Procedure Laterality Date  . ABDOMINAL HYSTERECTOMY    . BREAST BIOPSY Left 1970   surgical exc  .  BREAST BIOPSY Right 09/13/2016   path pending x 2 areas  . CHOLECYSTECTOMY  1968  . CORONARY ANGIOPLASTY    . CORONARY ARTERY BYPASS GRAFT  02/2003  . ery      Medications Prior to Admission  Medication Sig Dispense Refill Last Dose  . albuterol (PROVENTIL HFA;VENTOLIN HFA) 108 (90 Base) MCG/ACT inhaler Inhale 2 puffs every 6 (six) hours as needed into the lungs for wheezing or shortness of breath.   prn at prn  . allopurinol (ZYLOPRIM) 100 MG tablet Take 1 tablet daily by mouth.   05/24/2017 at Unknown time  . aspirin 81 MG tablet Take 81 mg daily by mouth.    05/24/2017 at Unknown time  . budesonide-formoterol (SYMBICORT) 160-4.5 MCG/ACT inhaler Inhale 2 puffs into the lungs 2 (two) times daily.   05/24/2017 at Unknown time  . cetirizine (ZYRTEC) 10 MG tablet Take 10 mg by mouth daily.   05/24/2017 at Unknown time  . colchicine 0.6 MG tablet Take 0.6 mg by mouth daily.   prn at prn  . Cyanocobalamin (VITAMIN B 12 PO) Take 1,000 mcg by mouth as directed.   05/24/2017 at Unknown time  . ergocalciferol (VITAMIN D2) 50000 units capsule Take 50,000 Units by mouth once a week.   05/24/2017 at Unknown time  . estradiol (ESTRACE) 0.1 MG/GM vaginal cream Place 1 Applicatorful vaginally 2 (two) times a week.   05/24/2017 at Unknown time  . furosemide (LASIX) 20 MG tablet Take 80 mg daily by mouth.    05/24/2017 at Unknown time  . gabapentin (NEURONTIN) 300  MG capsule Take 300 mg 3 (three) times daily by mouth.    05/24/2017 at Unknown time  . Insulin Glargine (LANTUS SOLOSTAR) 100 UNIT/ML Solostar Pen Inject 60-70 Units 2 (two) times daily into the skin. 70 units in the Am, 60 units at PM   05/25/2017 at Unknown time  . insulin lispro (HUMALOG) 100 UNIT/ML injection Inject 15-30 Units 3 (three) times daily before meals into the skin. 25 units breafast/lunch 30 units dinner   05/25/2017 at Unknown time  . Liraglutide (VICTOZA [place]) Inject 1.2 mg/mL at bedtime into the skin.    05/24/2017 at Unknown time  . losartan  (COZAAR) 100 MG tablet Take 1 tablet daily by mouth.   05/24/2017 at Unknown time  . metoprolol tartrate (LOPRESSOR) 50 MG tablet Take 25 mg by mouth daily.   05/24/2017 at Unknown time  . pantoprazole (PROTONIX) 40 MG tablet Take 40 mg 2 (two) times daily as needed by mouth.    05/24/2017 at Unknown time  . rOPINIRole (REQUIP) 0.5 MG tablet Take 0.5 mg by mouth at bedtime.   05/24/2017 at Unknown time  . sucralfate (CARAFATE) 1 g tablet Take 1 g by mouth 2 (two) times daily.   05/24/2017 at Unknown time  . LORazepam (ATIVAN) 0.5 MG tablet Take 0.5 mg by mouth every 8 (eight) hours.   prn at prn  . nitroGLYCERIN (NITROSTAT) 0.4 MG SL tablet Place 0.4 mg under the tongue every 5 (five) minutes as needed for chest pain.   prn at prn   Social History   Socioeconomic History  . Marital status: Widowed    Spouse name: Not on file  . Number of children: Not on file  . Years of education: Not on file  . Highest education level: Not on file  Social Needs  . Financial resource strain: Not on file  . Food insecurity - worry: Not on file  . Food insecurity - inability: Not on file  . Transportation needs - medical: Not on file  . Transportation needs - non-medical: Not on file  Occupational History  . Not on file  Tobacco Use  . Smoking status: Former Smoker    Packs/day: 1.00    Years: 15.00    Pack years: 15.00    Types: Cigarettes    Last attempt to quit: 07/17/1978    Years since quitting: 38.8  . Smokeless tobacco: Never Used  . Tobacco comment: quit in year 1980  Substance and Sexual Activity  . Alcohol use: No  . Drug use: No  . Sexual activity: Not on file  Other Topics Concern  . Not on file  Social History Narrative   History of smoking cigarettes: Former smoker   Quit in year : 1980   Pack-year Hx: 40   No alcohol.   Caffeine :yes    Diet : no   Exercise :no   Occupation : unemployed, retired    No Psychologist, educational status: single,widowed   LV systolic Dysfunction   EF  less than 40 >no    Family History  Problem Relation Age of Onset  . Breast cancer Sister 49  . Breast cancer Sister   . Diabetes Mother   . Heart disease Mother   . Kidney disease Father   . Hypertension Father       Review of systems complete and found to be negative unless listed above      PHYSICAL EXAM  General: Well developed, well nourished, in no acute  distress HEENT:  Normocephalic and atramatic Neck:  No JVD.  Lungs: Clear bilaterally to auscultation and percussion. Heart: HRRR . Normal S1 and S2 without gallops or murmurs.  Abdomen: Bowel sounds are positive, abdomen soft and non-tender  Msk:  Back normal, normal gait. Normal strength and tone for age. Extremities: No clubbing, cyanosis or edema.   Neuro: Alert and oriented X 3. Psych:  Good affect, responds appropriately  Labs:   Lab Results  Component Value Date   WBC 7.2 05/26/2017   HGB 11.7 (L) 05/26/2017   HCT 34.9 (L) 05/26/2017   MCV 94.6 05/26/2017   PLT 84 (L) 05/26/2017    Recent Labs  Lab 05/26/17 0443  NA 140  K 3.6  CL 105  CO2 26  BUN 32*  CREATININE 1.36*  CALCIUM 9.2  GLUCOSE 113*   Lab Results  Component Value Date   CKTOTAL 88 06/09/2012   CKMB 1.7 06/09/2012   TROPONINI 0.05 (HH) 05/26/2017   No results found for: CHOL No results found for: HDL No results found for: LDLCALC No results found for: TRIG No results found for: CHOLHDL No results found for: LDLDIRECT    Radiology: Dg Chest 2 View  Result Date: 05/25/2017 CLINICAL DATA:  Shortness of Breath.  Atrial fibrillation EXAM: CHEST  2 VIEW COMPARISON:  Chest CT November 11, 2014 and chest radiograph July 19, 2012 FINDINGS: There is cardiomegaly with pulmonary venous hypertension. There are small pleural effusions bilaterally. There is slight interstitial edema in the bases. There is no airspace consolidation. There is aortic atherosclerosis. Patient is status post internal mammary bypass grafting. No adenopathy. No  evident bone lesions. There is calcification in each carotid artery. IMPRESSION: Pulmonary vascular congestion with small pleural effusions and slight bibasilar atelectasis. No airspace consolidation. There is aortic atherosclerosis. There is calcification in each carotid artery. Aortic Atherosclerosis (ICD10-I70.0). Electronically Signed   By: Lowella Grip III M.D.   On: 05/25/2017 16:37    EKG: Atrial fibrillation/atrial flutter  ASSESSMENT AND PLAN:   1.  Atrial fibrillation/atrial flutter, chads Vasc 6, rate controlled, appears asymptomatic, likely secondary to recent diastolic congestive heart failure, as well as, sleep apnea and technical difficulties with CPAP 2.  Borderline elevated troponin, in the absence of chest pain, very likely due to demand supply ischemia, and not due to acute coronary syndrome 3.  Acute on chronic diastolic CHF, much improved after initial diuresis 4.  Known CAD, status post PTCA, status post CABG, without chest pain  Recommendations  1.  Agree with overall current therapy 2.  Continue diuresis 3.  Carefully monitor renal status 4.  DC heparin 5.  Start Eliquis for stroke prevention.  Discussed the risks, benefits and alternatives of chronic anticoagulation, as well as, the advantages and disadvantages of warfarin versus a novel oral anticoagulants, and the patient and her daughter agree to start Eliquis for stroke prevention. 6.  Defer cardiac catheterization at this time 7.  Review 2D echocardiogram  Signed: Isaias Cowman MD,PhD, [facility] 05/26/2017, 9:57 AM

## 2017-05-27 LAB — BASIC METABOLIC PANEL
Anion gap: 11 (ref 5–15)
BUN: 42 mg/dL — AB (ref 6–20)
CALCIUM: 9.1 mg/dL (ref 8.9–10.3)
CHLORIDE: 99 mmol/L — AB (ref 101–111)
CO2: 26 mmol/L (ref 22–32)
CREATININE: 1.54 mg/dL — AB (ref 0.44–1.00)
GFR calc non Af Amer: 30 mL/min — ABNORMAL LOW (ref >60)
GFR, EST AFRICAN AMERICAN: 34 mL/min — AB (ref >60)
Glucose, Bld: 182 mg/dL — ABNORMAL HIGH (ref 65–99)
Potassium: 3.8 mmol/L (ref 3.5–5.1)
SODIUM: 136 mmol/L (ref 135–145)

## 2017-05-27 LAB — GLUCOSE, CAPILLARY: GLUCOSE-CAPILLARY: 174 mg/dL — AB (ref 65–99)

## 2017-05-27 MED ORDER — METOPROLOL TARTRATE 50 MG PO TABS: 50.0000 mg | ORAL_TABLET | Freq: Every day | ORAL | 0 refills | Status: DC

## 2017-05-27 MED ORDER — APIXABAN 5 MG PO TABS: 5.0000 mg | ORAL_TABLET | Freq: Two times a day (BID) | ORAL | 0 refills | Status: DC

## 2017-05-27 NOTE — Discharge Instructions (Signed)
Heart Failure Clinic appointment on June 06 2017 at 9:00am with Darylene Price, [place]. Please call [phone] to reschedule.  Heart healthy and ADA diet.

## 2017-05-27 NOTE — Care Management Note (Signed)
Case Management Note  Patient Details  Name: Katherine Hernandez MRN: [id number] Date of Birth: 1930-05-17  Subjective/Objective:   Katherine Hernandez was provided with an Eliquis coupon.                  Action/Plan:   Expected Discharge Date:  05/27/17               Expected Discharge Plan:     In-House Referral:     Discharge planning Services     Post Acute Care Choice:    Choice offered to:     DME Arranged:    DME Agency:     HH Arranged:    HH Agency:     Status of Service:     If discussed at [employer], dates discussed:    Additional Comments:  Senai Kingsley A, RN 05/27/2017, 8:46 AM

## 2017-05-27 NOTE — Discharge Summary (Signed)
[place] [facility] [place] NAME: Katherine Hernandez    MR#:  [id number]  DATE OF BIRTH:  [birth year]  DATE OF ADMISSION:  05/25/2017   ADMITTING PHYSICIAN: Henreitta Leber, MD  DATE OF DISCHARGE: No discharge date for patient encounter.  PRIMARY CARE PHYSICIAN: Tracie Harrier, MD   ADMISSION DIAGNOSIS:  Acute pulmonary edema ([place]) [J81.0] Hypertensive urgency [I16.0] DISCHARGE DIAGNOSIS:  Active Problems:   CHF (congestive heart failure) ([place])  SECONDARY DIAGNOSIS:   Past Medical History:  Diagnosis Date  . Arthritis   . CAD (coronary artery disease)    Last nuclear 02/2009 EF 72% and neg for ischemia   . Coronary artery disease    CAD with PTCA(cfx)fx -1993,and CABG ,2004  . Diabetes mellitus without complication ([place])   . Diabetic nephropathy ([place])   . Diabetic peripheral neuropathy ([place])   . GERD (gastroesophageal reflux disease)   . Hyperlipidemia   . Hypertension   . Obesity   . Retinopathy, diabetic, background ([place])   . RLS (restless legs syndrome)   . Urinary incontinence    HOSPITAL COURSE:   83 year old female with past medical history of essential hypertension, hyperlipidemia, history of coronary artery disease, diabetes, diabetic neuropathy, osteoarthritis who presents to the hospital due to shortness of breath.  1. Acute on chronic diastolic CHF, LV EF: 63% - 65% She has been treated with IV Lasix, symptoms has much improved.  -ContinueMetoprolol, losartan.   2. New onset atrial fibrillation/flutter-currently rate controlled.  Started Eliquis and discontinued heparin drip per Dr. Saralyn Pilar.  3. Elevated troponin-in the setting of supply demand ischemia from the CHF and atrial fibrillation/flutter.  4. Diabetes type 2 without complication-continue Lantus, NovoLog with meals. Continue carb-controlled diet.  5. History of gout-no acute attack. Continue allopurinol.  6. COPD-no acute exacerbation-continue  Dulera, albuterol inhaler as needed.  7. Restless leg syndrome-continue Requip.  8. GERD-continue Protonix.  Acute renal failure, mild.  Possible due to diuretics.  Follow-up BMP as outpatient.  DISCHARGE CONDITIONS:  Stable, discharge to home today. CONSULTS OBTAINED:  Treatment Team:  Isaias Cowman, MD DRUG ALLERGIES:   Allergies  Allergen Reactions  . Amoxicillin   . Cefdinir   . Doxycycline   . Erythromycin   . Lisinopril   . Macrodantin [Nitrofurantoin]   . Mobic [Meloxicam]     diarrhea  . Statins   . Sulfa Antibiotics    DISCHARGE MEDICATIONS:   Allergies as of 05/27/2017      Reactions   Amoxicillin    Cefdinir    Doxycycline    Erythromycin    Lisinopril    Macrodantin [nitrofurantoin]    Mobic [meloxicam]    diarrhea   Statins    Sulfa Antibiotics       Medication List    STOP taking these medications   colchicine 0.6 MG tablet     TAKE these medications   albuterol 108 (90 Base) MCG/ACT inhaler Commonly known as:  PROVENTIL HFA;VENTOLIN HFA Inhale 2 puffs every 6 (six) hours as needed into the lungs for wheezing or shortness of breath.   allopurinol 100 MG tablet Commonly known as:  ZYLOPRIM Take 1 tablet daily by mouth.   apixaban 5 MG Tabs tablet Commonly known as:  ELIQUIS Take 1 tablet (5 mg total) 2 (two) times daily by mouth.   aspirin 81 MG tablet Take 81 mg daily by mouth.   budesonide-formoterol 160-4.5 MCG/ACT inhaler Commonly known as:  SYMBICORT Inhale 2 puffs into  the lungs 2 (two) times daily.   cetirizine 10 MG tablet Commonly known as:  ZYRTEC Take 10 mg by mouth daily.   ergocalciferol 50000 units capsule Commonly known as:  VITAMIN D2 Take 50,000 Units by mouth once a week.   estradiol 0.1 MG/GM vaginal cream Commonly known as:  ESTRACE Place 1 Applicatorful vaginally 2 (two) times a week.   furosemide 20 MG tablet Commonly known as:  LASIX Take 80 mg daily by mouth.   gabapentin 300 MG  capsule Commonly known as:  NEURONTIN Take 300 mg 3 (three) times daily by mouth.   insulin lispro 100 UNIT/ML injection Commonly known as:  HUMALOG Inject 15-30 Units 3 (three) times daily before meals into the skin. 25 units breafast/lunch 30 units dinner   LANTUS SOLOSTAR 100 UNIT/ML Solostar Pen Generic drug:  Insulin Glargine Inject 60-70 Units 2 (two) times daily into the skin. 70 units in the Am, 60 units at PM   LORazepam 0.5 MG tablet Commonly known as:  ATIVAN Take 0.5 mg by mouth every 8 (eight) hours.   losartan 100 MG tablet Commonly known as:  COZAAR Take 1 tablet daily by mouth.   metoprolol tartrate 50 MG tablet Commonly known as:  LOPRESSOR Take 1 tablet (50 mg total) daily by mouth. What changed:  how much to take   nitroGLYCERIN 0.4 MG SL tablet Commonly known as:  NITROSTAT Place 0.4 mg under the tongue every 5 (five) minutes as needed for chest pain.   pantoprazole 40 MG tablet Commonly known as:  PROTONIX Take 40 mg 2 (two) times daily as needed by mouth.   rOPINIRole 0.5 MG tablet Commonly known as:  REQUIP Take 0.5 mg by mouth at bedtime.   sucralfate 1 g tablet Commonly known as:  CARAFATE Take 1 g by mouth 2 (two) times daily.   VICTOZA [place] Inject 1.2 mg/mL at bedtime into the skin.   VITAMIN B 12 PO Take 1,000 mcg by mouth as directed.        DISCHARGE INSTRUCTIONS:  See AVS. If you experience worsening of your admission symptoms, develop shortness of breath, life threatening emergency, suicidal or homicidal thoughts you must seek medical attention immediately by calling 911 or calling your MD immediately  if symptoms less severe.  You Must read complete instructions/literature along with all the possible adverse reactions/side effects for all the Medicines you take and that have been prescribed to you. Take any new Medicines after you have completely understood and accpet all the possible adverse reactions/side effects.   Please  note  You were cared for by a hospitalist during your hospital stay. If you have any questions about your discharge medications or the care you received while you were in the hospital after you are discharged, you can call the unit and asked to speak with the hospitalist on call if the hospitalist that took care of you is not available. Once you are discharged, your primary care physician will handle any further medical issues. Please note that NO REFILLS for any discharge medications will be authorized once you are discharged, as it is imperative that you return to your primary care physician (or establish a relationship with a primary care physician if you do not have one) for your aftercare needs so that they can reassess your need for medications and monitor your lab values.    On the day of Discharge:  VITAL SIGNS:  Blood pressure (!) 159/46, pulse 84, temperature (!) 97.5 F (36.4 C),  temperature source Oral, resp. rate 20, height 5\' 2"  (1.575 m), weight 201 lb 4.8 oz (91.3 kg), SpO2 94 %. PHYSICAL EXAMINATION:  GENERAL:  83 y.o.-year-old patient lying in the bed with no acute distress.  EYES: Pupils equal, round, reactive to light and accommodation. No scleral icterus. Extraocular muscles intact.  HEENT: Head atraumatic, normocephalic. Oropharynx and nasopharynx clear.  NECK:  Supple, no jugular venous distention. No thyroid enlargement, no tenderness.  LUNGS: Normal breath sounds bilaterally, tiny wheezing, no rales,rhonchi or crepitation. No use of accessory muscles of respiration.  CARDIOVASCULAR: S1, S2 normal. No murmurs, rubs, or gallops.  ABDOMEN: Soft, non-tender, non-distended. Bowel sounds present. No organomegaly or mass.  EXTREMITIES: No pedal edema, cyanosis, or clubbing.  NEUROLOGIC: Cranial nerves II through XII are intact. Muscle strength 5/5 in all extremities. Sensation intact. Gait not checked.  PSYCHIATRIC: The patient is alert and oriented x 3.  SKIN: No obvious rash,  lesion, or ulcer.  DATA REVIEW:   CBC Recent Labs  Lab 05/26/17 0443  WBC 7.2  HGB 11.7*  HCT 34.9*  PLT 84*    Chemistries  Recent Labs  Lab 05/26/17 0443 05/27/17 0439  NA 140 136  K 3.6 3.8  CL 105 99*  CO2 26 26  GLUCOSE 113* 182*  BUN 32* 42*  CREATININE 1.36* 1.54*  CALCIUM 9.2 9.1  MG 2.2  --      Microbiology Results  Results for orders placed or performed during the hospital encounter of 05/25/17  MRSA PCR Screening     Status: None   Collection Time: 05/25/17  9:21 PM  Result Value Ref Range Status   MRSA by PCR NEGATIVE NEGATIVE Final    Comment:        The GeneXpert MRSA Assay (FDA approved for NASAL specimens only), is one component of a comprehensive MRSA colonization surveillance program. It is not intended to diagnose MRSA infection nor to guide or monitor treatment for MRSA infections.     RADIOLOGY:  No results found.   Management plans discussed with the patient, her daughter and they are in agreement.  CODE STATUS: Full Code   TOTAL TIME TAKING CARE OF THIS PATIENT: 36 minutes.    Demetrios Loll M.D on 05/27/2017 at 10:58 AM  Between 7am to 6pm - Pager - [phone]  After 6pm go to [email or web address] - Proofreader  [facility]  Office  [phone]  CC: Primary care physician; Tracie Harrier, MD   Note: This dictation was prepared with Dragon dictation along with smaller phrase technology. Any transcriptional errors that result from this process are unintentional.

## 2017-06-06 ENCOUNTER — Encounter: Payer: Self-pay | Admitting: Family

## 2017-06-06 ENCOUNTER — Ambulatory Visit: Payer: Medicare Other | Attending: Family | Admitting: Family

## 2017-06-06 VITALS — BP 163/83 | HR 89 | Resp 18 | Ht 61.0 in | Wt 198.1 lb

## 2017-06-06 DIAGNOSIS — I2510 Atherosclerotic heart disease of native coronary artery without angina pectoris: Secondary | ICD-10-CM | POA: Insufficient documentation

## 2017-06-06 DIAGNOSIS — Z87891 Personal history of nicotine dependence: Secondary | ICD-10-CM | POA: Insufficient documentation

## 2017-06-06 DIAGNOSIS — K219 Gastro-esophageal reflux disease without esophagitis: Secondary | ICD-10-CM | POA: Insufficient documentation

## 2017-06-06 DIAGNOSIS — Z7901 Long term (current) use of anticoagulants: Secondary | ICD-10-CM | POA: Diagnosis not present

## 2017-06-06 DIAGNOSIS — Z951 Presence of aortocoronary bypass graft: Secondary | ICD-10-CM | POA: Diagnosis not present

## 2017-06-06 DIAGNOSIS — Z7982 Long term (current) use of aspirin: Secondary | ICD-10-CM | POA: Insufficient documentation

## 2017-06-06 DIAGNOSIS — E669 Obesity, unspecified: Secondary | ICD-10-CM | POA: Diagnosis not present

## 2017-06-06 DIAGNOSIS — I4891 Unspecified atrial fibrillation: Secondary | ICD-10-CM | POA: Insufficient documentation

## 2017-06-06 DIAGNOSIS — I5032 Chronic diastolic (congestive) heart failure: Secondary | ICD-10-CM | POA: Insufficient documentation

## 2017-06-06 DIAGNOSIS — M1990 Unspecified osteoarthritis, unspecified site: Secondary | ICD-10-CM | POA: Diagnosis not present

## 2017-06-06 DIAGNOSIS — Z9049 Acquired absence of other specified parts of digestive tract: Secondary | ICD-10-CM | POA: Diagnosis not present

## 2017-06-06 DIAGNOSIS — E1121 Type 2 diabetes mellitus with diabetic nephropathy: Secondary | ICD-10-CM | POA: Diagnosis not present

## 2017-06-06 DIAGNOSIS — R5383 Other fatigue: Secondary | ICD-10-CM | POA: Diagnosis not present

## 2017-06-06 DIAGNOSIS — I10 Essential (primary) hypertension: Secondary | ICD-10-CM

## 2017-06-06 DIAGNOSIS — I110 Hypertensive heart disease with heart failure: Secondary | ICD-10-CM | POA: Insufficient documentation

## 2017-06-06 DIAGNOSIS — G2581 Restless legs syndrome: Secondary | ICD-10-CM | POA: Insufficient documentation

## 2017-06-06 DIAGNOSIS — I890 Lymphedema, not elsewhere classified: Secondary | ICD-10-CM | POA: Diagnosis not present

## 2017-06-06 DIAGNOSIS — E1142 Type 2 diabetes mellitus with diabetic polyneuropathy: Secondary | ICD-10-CM | POA: Insufficient documentation

## 2017-06-06 DIAGNOSIS — Z880 Allergy status to penicillin: Secondary | ICD-10-CM | POA: Diagnosis not present

## 2017-06-06 DIAGNOSIS — I480 Paroxysmal atrial fibrillation: Secondary | ICD-10-CM

## 2017-06-06 DIAGNOSIS — E1122 Type 2 diabetes mellitus with diabetic chronic kidney disease: Secondary | ICD-10-CM

## 2017-06-06 DIAGNOSIS — E785 Hyperlipidemia, unspecified: Secondary | ICD-10-CM | POA: Diagnosis not present

## 2017-06-06 DIAGNOSIS — Z794 Long term (current) use of insulin: Secondary | ICD-10-CM | POA: Insufficient documentation

## 2017-06-06 DIAGNOSIS — Z79899 Other long term (current) drug therapy: Secondary | ICD-10-CM | POA: Insufficient documentation

## 2017-06-06 DIAGNOSIS — F419 Anxiety disorder, unspecified: Secondary | ICD-10-CM | POA: Diagnosis not present

## 2017-06-06 DIAGNOSIS — E11319 Type 2 diabetes mellitus with unspecified diabetic retinopathy without macular edema: Secondary | ICD-10-CM | POA: Diagnosis not present

## 2017-06-06 DIAGNOSIS — N183 Chronic kidney disease, stage 3 (moderate): Secondary | ICD-10-CM

## 2017-06-06 NOTE — Patient Instructions (Signed)
Continue weighing daily and call for an overnight weight gain of > 2 pounds or a weekly weight gain of >5 pounds. 

## 2017-06-06 NOTE — Progress Notes (Signed)
Patient ID: Katherine Hernandez, female    DOB: [birth year], 84 y.o.   MRN: [id number]  HPI  [name] is an 83 y/o female with a history of RLS, HTN, hyperlipidemia, GERD, diabetes, CAD, arthritis and chronic heart failure.   Echo report from 05/26/17 reviewed and showed an EF of 55-65% along with mild MR.   Admitted 05/25/17 due to acute on chronic HF. Initially needed IV lasix and then transitioned to oral diuretics. New onset AF and was begun on eliquis. Cardiology consult obtained. Elevated troponin thought to be due to demand ischemia. Discharged home after 2 days.   She presents today for her initial visit with a chief complaint of moderate fatigue with minimal exertion. She describes this as chronic in nature having been present for several years with varying levels of severity. She has associated shortness of breath, light-headedness, edema, palpitations and anxiety along with this. She denies any chest pain, weight gain or difficulty sleeping.   Past Medical History:  Diagnosis Date  . Arthritis   . CAD (coronary artery disease)    Last nuclear 02/2009 EF 72% and neg for ischemia   . CHF (congestive heart failure) ([place])   . Coronary artery disease    CAD with PTCA(cfx)fx -1993,and CABG ,2004  . Diabetes mellitus without complication ([place])   . Diabetic nephropathy ([place])   . Diabetic peripheral neuropathy ([place])   . GERD (gastroesophageal reflux disease)   . Hyperlipidemia   . Hypertension   . Obesity   . Retinopathy, diabetic, background ([place])   . RLS (restless legs syndrome)   . Urinary incontinence    Past Surgical History:  Procedure Laterality Date  . ABDOMINAL HYSTERECTOMY    . BREAST BIOPSY Left 1970   surgical exc  . BREAST BIOPSY Right 09/13/2016   path pending x 2 areas  . CHOLECYSTECTOMY  1968  . COLONOSCOPY WITH PROPOFOL N/A 01/02/2017   Procedure: COLONOSCOPY WITH PROPOFOL;  Surgeon: Lollie Sails, MD;  Location: [facility] ENDOSCOPY;  Service: Endoscopy;   Laterality: N/A;  . CORONARY ANGIOPLASTY    . CORONARY ARTERY BYPASS GRAFT  02/2003  . ery    . ESOPHAGOGASTRODUODENOSCOPY (EGD) WITH PROPOFOL N/A 01/02/2017   Procedure: ESOPHAGOGASTRODUODENOSCOPY (EGD) WITH PROPOFOL;  Surgeon: Lollie Sails, MD;  [facility] ENDOSCOPY;  Service: Endoscopy;  Laterality: N/A;   Family History  Problem Relation Age of Onset  . Breast cancer Sister 4  . Breast cancer Sister   . Diabetes Mother   . Heart disease Mother   . Kidney disease Father   . Hypertension Father    Social History   Tobacco Use  . Smoking status: Former Smoker    Packs/day: 1.00    Years: 15.00    Pack years: 15.00    Types: Cigarettes    Last attempt to quit: 07/17/1978    Years since quitting: 38.9  . Smokeless tobacco: Never Used  . Tobacco comment: quit in year 1980  Substance Use Topics  . Alcohol use: No   Allergies  Allergen Reactions  . Amoxicillin   . Cefdinir   . Doxycycline   . Erythromycin   . Lisinopril   . Macrodantin [Nitrofurantoin]   . Mobic [Meloxicam]     diarrhea  . Statins   . Sulfa Antibiotics    Prior to Admission medications   Medication Sig Start Date End Date Taking? Authorizing Provider  albuterol (PROVENTIL HFA;VENTOLIN HFA) 108 (90 Base) MCG/ACT inhaler Inhale 2 puffs every 6 (six)  hours as needed into the lungs for wheezing or shortness of breath.   Yes [provider]  allopurinol (ZYLOPRIM) 100 MG tablet Take 1 tablet daily by mouth. 03/28/17 09/24/17 Yes [provider]  apixaban (ELIQUIS) 5 MG TABS tablet Take 1 tablet (5 mg total) 2 (two) times daily by mouth. 05/27/17  Yes Demetrios Loll, MD  aspirin 81 MG tablet Take 81 mg daily by mouth.    Yes [provider]  budesonide-formoterol (SYMBICORT) 160-4.5 MCG/ACT inhaler Inhale 2 puffs into the lungs 2 (two) times daily.   Yes [provider]  cetirizine (ZYRTEC) 10 MG tablet Take 10 mg by mouth daily.   Yes [provider]   Cyanocobalamin (B-12 COMPLIANCE INJECTION IJ) Inject as directed every 30 (thirty) days.   Yes [provider]  ergocalciferol (VITAMIN D2) 50000 units capsule Take 50,000 Units by mouth once a week.   Yes [provider]  estradiol (ESTRACE) 0.1 MG/GM vaginal cream Place 1 Applicatorful vaginally 2 (two) times a week.   Yes [provider]  ferrous sulfate 325 (65 FE) MG tablet Take 325 mg by mouth daily with breakfast.   Yes [provider]  furosemide (LASIX) 20 MG tablet Take 80 mg daily by mouth.    Yes [provider]  gabapentin (NEURONTIN) 300 MG capsule Take 300 mg 3 (three) times daily by mouth.    Yes [provider]  Insulin Glargine (LANTUS SOLOSTAR) 100 UNIT/ML Solostar Pen Inject 60-70 Units 2 (two) times daily into the skin. 70 units in the Am, 60 units at PM   Yes [provider]  insulin lispro (HUMALOG) 100 UNIT/ML injection Inject 15-30 Units 3 (three) times daily before meals into the skin. 25 units breafast/lunch 30 units dinner   Yes [provider]  Liraglutide (VICTOZA [place]) Inject 1.2 mg/mL at bedtime into the skin.    Yes [provider]  LORazepam (ATIVAN) 0.5 MG tablet Take 0.5 mg by mouth every 8 (eight) hours.   Yes [provider]  losartan (COZAAR) 100 MG tablet Take 1 tablet daily by mouth. 03/28/17 09/24/17 Yes [provider]  metoprolol tartrate (LOPRESSOR) 50 MG tablet Take 1 tablet (50 mg total) daily by mouth. 05/27/17  Yes Demetrios Loll, MD  nitroGLYCERIN (NITROSTAT) 0.4 MG SL tablet Place 0.4 mg under the tongue every 5 (five) minutes as needed for chest pain.   Yes [provider]  pantoprazole (PROTONIX) 40 MG tablet Take 40 mg 2 (two) times daily as needed by mouth.    Yes [provider]  rOPINIRole (REQUIP) 0.5 MG tablet Take 0.5 mg by mouth at bedtime.   Yes [provider]  sucralfate (CARAFATE) 1 g tablet Take 1 g by mouth 2 (two)  times daily.   Yes [provider]   Review of Systems  Constitutional: Positive for fatigue. Negative for appetite change.  HENT: Positive for rhinorrhea. Negative for congestion and sore throat.   Eyes: Negative.   Respiratory: Positive for shortness of breath. Negative for chest tightness.   Cardiovascular: Positive for palpitations and leg swelling. Negative for chest pain.  Gastrointestinal: Negative for abdominal distention and abdominal pain.  Endocrine: Negative.   Genitourinary: Negative.   Musculoskeletal: Positive for back pain. Negative for neck pain.  Skin: Negative.   Allergic/Immunologic: Negative.   Neurological: Positive for light-headedness and headaches (intermittent/mild). Negative for dizziness.       Cramping in legs at night   Hematological: Negative for adenopathy.  Bruises/bleeds easily.  Psychiatric/Behavioral: Negative for dysphoric mood and sleep disturbance (has CPAP to wear). The patient is nervous/anxious.    Vitals:   06/06/17 0905 06/06/17 0906  BP:  (!) 163/83  Pulse:  89  Resp:  18  SpO2:  100%  Weight: 198 lb 2 oz (89.9 kg) 198 lb 2 oz (89.9 kg)  Height: 5\' 1"  (1.549 m) 5\' 1"  (1.549 m)   Wt Readings from Last 3 Encounters:  06/06/17 198 lb 2 oz (89.9 kg)  05/25/17 201 lb 4.8 oz (91.3 kg)  01/02/17 196 lb (88.9 kg)   Lab Results  Component Value Date   CREATININE 1.54 (H) 05/27/2017   CREATININE 1.36 (H) 05/26/2017   CREATININE 1.29 (H) 05/25/2017   Physical Exam  Constitutional: She is oriented to person, place, and time. She appears well-developed and well-nourished.  HENT:  Head: Normocephalic and atraumatic.  Neck: Normal range of motion. Neck supple. No JVD present.  Cardiovascular: Normal rate. An irregular rhythm present.  Pulmonary/Chest: Effort normal. She has no wheezes. She has no rales.  Abdominal: Soft. She exhibits no distension. There is no tenderness.  Musculoskeletal: She exhibits edema. She exhibits no  tenderness.  Neurological: She is alert and oriented to person, place, and time.  Skin: Skin is warm and dry.  Psychiatric: Her behavior is normal. Thought content normal. Her mood appears anxious.  Nursing note and vitals reviewed.  Assessment & Plan:  1: Chronic heart failure with preserved ejection fraction- - NYHA class III - mildly fluid overloaded today - already weighing daily and says that her weight has been stable. Instructed to call for an overnight weight gain of >2 pounds or a weekly weight gain of >5 pounds - not adding salt to her food and has started reading food labels for sodium content. Discussed the importance of closely following a 2000mg  sodium diet and written dietary information was given to her about this - PT has recently started working with patient - patient reports receiving flu vaccine for this season already - saw cardiologist ([place]) 04/24/17 and returns later today - PharmD went in and reviewed medications with the patient  2: HTN- - BP mildly elevated but patient admits that she's anxious today - saw PCP ([place]) 05/25/17 - BMP from 05/27/17 reviewed and showed sodium 136, potassium 3.8 and GFR 30  3: Atrial fibrillation- - on apixaban & metoprolol tartrate - currently irregular rhythm  4: Diabetes-  - glucose at home this morning was 157 - saw endocrinologist ([place]) 03/20/17 & returns 06/23/17 - A1c on 05/26/17 was 7.7%  5: Lymphedema- - stage 2 - does elevate her legs but edema persists - has just recently started wearing TED hose - limited in exercise due to fatigue - discussed compression boots with her   Medication bottles were reviewed.  Return in 1 month or sooner for any questions/problems before then.

## 2017-06-07 DIAGNOSIS — I890 Lymphedema, not elsewhere classified: Secondary | ICD-10-CM | POA: Insufficient documentation

## 2017-06-07 DIAGNOSIS — I4891 Unspecified atrial fibrillation: Secondary | ICD-10-CM | POA: Insufficient documentation

## 2017-06-07 DIAGNOSIS — I10 Essential (primary) hypertension: Secondary | ICD-10-CM | POA: Insufficient documentation

## 2017-06-07 DIAGNOSIS — E119 Type 2 diabetes mellitus without complications: Secondary | ICD-10-CM | POA: Insufficient documentation

## 2017-06-12 ENCOUNTER — Ambulatory Visit (INDEPENDENT_AMBULATORY_CARE_PROVIDER_SITE_OTHER): Payer: Medicare Other | Admitting: Internal Medicine

## 2017-06-12 ENCOUNTER — Encounter: Payer: Self-pay | Admitting: Internal Medicine

## 2017-06-12 VITALS — BP 138/74 | HR 65 | Resp 16 | Ht 61.0 in | Wt 196.0 lb

## 2017-06-12 DIAGNOSIS — J449 Chronic obstructive pulmonary disease, unspecified: Secondary | ICD-10-CM | POA: Diagnosis not present

## 2017-06-12 DIAGNOSIS — G4719 Other hypersomnia: Secondary | ICD-10-CM | POA: Diagnosis not present

## 2017-06-12 NOTE — Patient Instructions (Signed)
--  Will send for sleep study and pulmonary function test.     Sleep Apnea Sleep apnea is disorder that affects a person's sleep. A person with sleep apnea has abnormal pauses in their breathing when they sleep. It is hard for them to get a good sleep. This makes a person tired during the day. It also can lead to other physical problems. There are three types of sleep apnea. One type is when breathing stops for a short time because your airway is blocked (obstructive sleep apnea). Another type is when the brain sometimes fails to give the normal signal to breathe to the muscles that control your breathing (central sleep apnea). The third type is a combination of the other two types. HOME CARE   Take all medicine as told by your doctor.  Avoid alcohol, calming medicines (sedatives), and depressant drugs.  Try to lose weight if you are overweight. Talk to your doctor about a healthy weight goal.  Your doctor may have you use a device that helps to open your airway. It can help you get the air that you need. It is called a positive airway pressure (PAP) device.   MAKE SURE YOU:   Understand these instructions.  Will watch your condition.  Will get help right away if you are not doing well or get worse.  It may take approximately 1 month for you to get used to wearing her CPAP every night.  Be sure to work with your machine to get used to it, be patient, it may take time!

## 2017-06-12 NOTE — Progress Notes (Signed)
[facility] Consultation      Assessment and Plan:  Excessive daytime sleepiness, recently diagnosed obstructive sleep apnea. --Her CPAP was taken away due to noncompliance, unfortunately this was while she was in the hospital. - Given her underlying congestive heart failure, possible COPD and poor tolerance of CPAP when she was on it we will send her for a in lab study.  Congestive heart failure -Noted congestive heart failure with recent hospital admission with CHF exacerbation, and atrial fibrillation.. -Continue to follow with cardiology.  COPD/asthma. -Diagnosis of COPD/asthma in the past.  Currently on Symbicort, she is not sure if it helps, she notices significant improvement with pro-air inhaler. -We will send for PFT.  Hoarseness of voice. - Chronic hoarseness of voice, discussed that this may be secondary to Symbicort, due to the steroid component. -We will reassess at her next appointment, continue discontinuing Symbicort based on results of the pulmonary function testing.   Date: 06/12/2017  MRN# [id number] Katherine Hernandez [birth year]    Katherine Hernandez is a 83 y.o. old female seen in consultation for chief complaint of:    Chief Complaint  Patient presents with  . Advice Only    Referred by Dr. Ubaldo Glassing sleep evaluation: daytime sleepiness Pt was on cpap which started in October 2018 and the machine was taken away due to a couple of weeks of non-compliance.    HPI:   Her daughter is present and gives some of the history. She had a sleep study several years ago, probably in the 2's which was negative. More recently she has been waking up tired in the morning, she was winded, and had a lot of swelling in the legs. She has been told that she has asthma/COPD which did not really help. She was then sent for the HST last month.  She is sleepy during the day, she does take a nap during the day on most days. She does not wear oxygen. She frequently falls  asleep when in groups.   When she first started on cpap, she had trouble tolerating the pressure, and she was told that it could not be changed. She occasionally wheezes, and she does this while sleeping. She was admitted to the hospital a few days after starting the cpap, her daughter notes that every day that she wore it her breathing got worse until she was struggling to breathe. She was then admitted to the hospital with CHF. Unfortunately her home care company deemed her non-compliant and the machine was taken away.   She is currently taking symbicort 2 puffs twice per day, she has been having hoarseness of voice, which has been present for about a year. She quit smoking remotely. She takes albuterol and finds that it is more helpful than the symbicort.   **Review of outside sleep study 04/17/17; AHI equals 24.  Auto CPAP patient was then started on CPAP, on review of download data usage greater than 4 hours was 3 out of 30 days.  Average usage on days used was 3 hours 55 minutes.  Minimum pressure was 5, max is 20.  Median pressure was 7, 95th percentile was 11, maximum was 12.  Residual AHI was 4.9. **Images personally reviewed, 05/25/17; cardiomegaly, interstitial edema consistent with congestive heart failure.  PMHX:   Past Medical History:  Diagnosis Date  . Arthritis   . CAD (coronary artery disease)    Last nuclear 02/2009 EF 72% and neg for ischemia   . CHF (congestive heart  failure) ([place])   . Coronary artery disease    CAD with PTCA(cfx)fx -1993,and CABG ,2004  . Diabetes mellitus without complication ([place])   . Diabetic nephropathy ([place])   . Diabetic peripheral neuropathy ([place])   . GERD (gastroesophageal reflux disease)   . Hyperlipidemia   . Hypertension   . Obesity   . Retinopathy, diabetic, background ([place])   . RLS (restless legs syndrome)   . Urinary incontinence    Surgical Hx:  Past Surgical History:  Procedure Laterality Date  . ABDOMINAL HYSTERECTOMY    . BREAST  BIOPSY Left 1970   surgical exc  . BREAST BIOPSY Right 09/13/2016   path pending x 2 areas  . CHOLECYSTECTOMY  1968  . COLONOSCOPY WITH PROPOFOL N/A 01/02/2017   Procedure: COLONOSCOPY WITH PROPOFOL;  Surgeon: Lollie Sails, MD;  Location: [facility] ENDOSCOPY;  Service: Endoscopy;  Laterality: N/A;  . CORONARY ANGIOPLASTY    . CORONARY ARTERY BYPASS GRAFT  02/2003  . ery    . ESOPHAGOGASTRODUODENOSCOPY (EGD) WITH PROPOFOL N/A 01/02/2017   Procedure: ESOPHAGOGASTRODUODENOSCOPY (EGD) WITH PROPOFOL;  Surgeon: Lollie Sails, MD;  Location[facility] ENDOSCOPY;  Service: Endoscopy;  Laterality: N/A;   Family Hx:  Family History  Problem Relation Age of Onset  . Breast cancer Sister 71  . Breast cancer Sister   . Diabetes Mother   . Heart disease Mother   . Kidney disease Father   . Hypertension Father    Social Hx:   Social History   Tobacco Use  . Smoking status: Former Smoker    Packs/day: 1.00    Years: 15.00    Pack years: 15.00    Types: Cigarettes    Last attempt to quit: 07/17/1978    Years since quitting: 38.9  . Smokeless tobacco: Never Used  . Tobacco comment: quit in year 1980  Substance Use Topics  . Alcohol use: No  . Drug use: No   Medication:    Current Outpatient Medications:  .  albuterol (PROVENTIL HFA;VENTOLIN HFA) 108 (90 Base) MCG/ACT inhaler, Inhale 2 puffs every 6 (six) hours as needed into the lungs for wheezing or shortness of breath., Disp: , Rfl:  .  allopurinol (ZYLOPRIM) 100 MG tablet, Take 1 tablet daily by mouth., Disp: , Rfl:  .  apixaban (ELIQUIS) 5 MG TABS tablet, Take 1 tablet (5 mg total) 2 (two) times daily by mouth., Disp: 60 tablet, Rfl: 0 .  aspirin 81 MG tablet, Take 81 mg daily by mouth. , Disp: , Rfl:  .  budesonide-formoterol (SYMBICORT) 160-4.5 MCG/ACT inhaler, Inhale 2 puffs into the lungs 2 (two) times daily., Disp: , Rfl:  .  cetirizine (ZYRTEC) 10 MG tablet, Take 10 mg by mouth daily., Disp: , Rfl:  .  Cyanocobalamin (B-12  COMPLIANCE INJECTION IJ), Inject as directed every 30 (thirty) days., Disp: , Rfl:  .  ergocalciferol (VITAMIN D2) 50000 units capsule, Take 50,000 Units by mouth once a week., Disp: , Rfl:  .  estradiol (ESTRACE) 0.1 MG/GM vaginal cream, Place 1 Applicatorful vaginally 2 (two) times a week., Disp: , Rfl:  .  ferrous sulfate 325 (65 FE) MG tablet, Take 325 mg by mouth daily with breakfast., Disp: , Rfl:  .  furosemide (LASIX) 20 MG tablet, Take 80 mg daily by mouth. , Disp: , Rfl:  .  gabapentin (NEURONTIN) 300 MG capsule, Take 300 mg 3 (three) times daily by mouth. , Disp: , Rfl:  .  Insulin Glargine (LANTUS SOLOSTAR) 100 UNIT/ML Solostar  Pen, Inject 60-70 Units 2 (two) times daily into the skin. 70 units in the Am, 60 units at PM, Disp: , Rfl:  .  insulin lispro (HUMALOG) 100 UNIT/ML injection, Inject 15-30 Units 3 (three) times daily before meals into the skin. 25 units breafast/lunch 30 units dinner, Disp: , Rfl:  .  Liraglutide (VICTOZA [place]), Inject 1.2 mg/mL at bedtime into the skin. , Disp: , Rfl:  .  LORazepam (ATIVAN) 0.5 MG tablet, Take 0.5 mg by mouth every 8 (eight) hours., Disp: , Rfl:  .  losartan (COZAAR) 100 MG tablet, Take 1 tablet daily by mouth., Disp: , Rfl:  .  metoprolol tartrate (LOPRESSOR) 50 MG tablet, Take 1 tablet (50 mg total) daily by mouth., Disp: 30 tablet, Rfl: 0 .  nitroGLYCERIN (NITROSTAT) 0.4 MG SL tablet, Place 0.4 mg under the tongue every 5 (five) minutes as needed for chest pain., Disp: , Rfl:  .  pantoprazole (PROTONIX) 40 MG tablet, Take 40 mg 2 (two) times daily as needed by mouth. , Disp: , Rfl:  .  rOPINIRole (REQUIP) 0.5 MG tablet, Take 0.5 mg by mouth at bedtime., Disp: , Rfl:  .  sucralfate (CARAFATE) 1 g tablet, Take 1 g by mouth 2 (two) times daily., Disp: , Rfl:    Allergies:  Amoxicillin; Cefdinir; Doxycycline; Erythromycin; Lisinopril; Macrodantin [nitrofurantoin]; Mobic [meloxicam]; Statins; and Sulfa antibiotics  Review of Systems: Gen:   Denies  fever, sweats, chills HEENT: Denies blurred vision, double vision. bleeds, sore throat Cvc:  No dizziness, chest pain. Resp:   Denies cough or sputum production, shortness of breath Gi: Denies swallowing difficulty, stomach pain. Gu:  Denies bladder incontinence, burning urine Ext:   No Joint pain, stiffness. Skin: No skin rash,  hives  Endoc:  No polyuria, polydipsia. Psych: No depression, insomnia. Other:  All other systems were reviewed with the patient and were negative other that what is mentioned in the HPI.   Physical Examination:   VS: BP 138/74 (BP Location: Left Arm, Cuff Size: Large)   Pulse 65   Resp 16   Ht 5\' 1"  (1.549 m)   Wt 196 lb (88.9 kg)   SpO2 97%   BMI 37.03 kg/m   General Appearance: No distress  Neuro:without focal findings,  speech normal,  HEENT: PERRLA, EOM intact.   Pulmonary: normal breath sounds, No wheezing.  CardiovascularNormal S1,S2.  No m/r/g.   Abdomen: Benign, Soft, non-tender. Renal:  No costovertebral tenderness  GU:  No performed at this time. Endoc: No evident thyromegaly, no signs of acromegaly. Skin:   warm, no rashes, no ecchymosis  Extremities: normal, no cyanosis, clubbing.  Other findings:    LABORATORY PANEL:   CBC No results for input(s): WBC, HGB, HCT, PLT in the last 168 hours. ------------------------------------------------------------------------------------------------------------------  Chemistries  No results for input(s): NA, K, CL, CO2, GLUCOSE, BUN, CREATININE, CALCIUM, MG, AST, ALT, ALKPHOS, BILITOT in the last 168 hours.  Invalid input(s): GFRCGP ------------------------------------------------------------------------------------------------------------------  Cardiac Enzymes No results for input(s): TROPONINI in the last 168 hours. ------------------------------------------------------------  RADIOLOGY:  No results found.     Thank  you for the consultation and for allowing [facility] to assist in the care of your patient. Our recommendations are noted above.  Please contact us if we can be of further service.   Marda Stalker, MD.  Board Certified in Internal Medicine, Pulmonary Medicine, [place], and Sleep Medicine.  [facility] Office Number: [phone]  Patricia Pesa, M.D.  Merton Border, M.D  06/12/2017

## 2017-06-14 ENCOUNTER — Ambulatory Visit: Payer: Medicare Other | Attending: Internal Medicine

## 2017-06-14 DIAGNOSIS — J449 Chronic obstructive pulmonary disease, unspecified: Secondary | ICD-10-CM | POA: Insufficient documentation

## 2017-06-14 DIAGNOSIS — G4710 Hypersomnia, unspecified: Secondary | ICD-10-CM | POA: Diagnosis present

## 2017-06-14 DIAGNOSIS — G4761 Periodic limb movement disorder: Secondary | ICD-10-CM | POA: Insufficient documentation

## 2017-06-14 DIAGNOSIS — G4733 Obstructive sleep apnea (adult) (pediatric): Secondary | ICD-10-CM | POA: Insufficient documentation

## 2017-06-18 DIAGNOSIS — G4733 Obstructive sleep apnea (adult) (pediatric): Secondary | ICD-10-CM | POA: Diagnosis not present

## 2017-06-19 ENCOUNTER — Telehealth: Payer: Self-pay | Admitting: *Deleted

## 2017-06-19 DIAGNOSIS — G4733 Obstructive sleep apnea (adult) (pediatric): Secondary | ICD-10-CM

## 2017-06-19 NOTE — Telephone Encounter (Signed)
Spoke with daughter Mariann Laster and informed her of the sleep study results. Order placed for for CPAP titration.

## 2017-06-19 NOTE — Telephone Encounter (Signed)
-----   Message from Laverle Hobby, MD sent at 06/18/2017  4:51 PM EST ----- Regarding: Sleep study results.  Sleep study showed AHI of 7.4.  Recommend CPAP titration study.

## 2017-06-22 ENCOUNTER — Ambulatory Visit: Payer: Medicare Other | Attending: Internal Medicine

## 2017-06-22 DIAGNOSIS — G4733 Obstructive sleep apnea (adult) (pediatric): Secondary | ICD-10-CM | POA: Insufficient documentation

## 2017-06-22 DIAGNOSIS — R400 Somnolence: Secondary | ICD-10-CM | POA: Insufficient documentation

## 2017-06-27 DIAGNOSIS — G4733 Obstructive sleep apnea (adult) (pediatric): Secondary | ICD-10-CM | POA: Diagnosis not present

## 2017-06-29 ENCOUNTER — Telehealth: Payer: Self-pay | Admitting: *Deleted

## 2017-06-29 DIAGNOSIS — G4733 Obstructive sleep apnea (adult) (pediatric): Secondary | ICD-10-CM

## 2017-06-29 NOTE — Telephone Encounter (Signed)
Daughter Mariann Laster informed of titration study results order placed. Nothing further needed.

## 2017-07-03 ENCOUNTER — Ambulatory Visit: Payer: Medicare Other | Attending: Family | Admitting: Family

## 2017-07-03 ENCOUNTER — Encounter: Payer: Self-pay | Admitting: Family

## 2017-07-03 VITALS — BP 110/33 | HR 66 | Resp 18 | Ht 61.0 in | Wt 188.1 lb

## 2017-07-03 DIAGNOSIS — Z87891 Personal history of nicotine dependence: Secondary | ICD-10-CM | POA: Insufficient documentation

## 2017-07-03 DIAGNOSIS — Z79899 Other long term (current) drug therapy: Secondary | ICD-10-CM | POA: Insufficient documentation

## 2017-07-03 DIAGNOSIS — I5032 Chronic diastolic (congestive) heart failure: Secondary | ICD-10-CM | POA: Diagnosis not present

## 2017-07-03 DIAGNOSIS — I480 Paroxysmal atrial fibrillation: Secondary | ICD-10-CM

## 2017-07-03 DIAGNOSIS — K219 Gastro-esophageal reflux disease without esophagitis: Secondary | ICD-10-CM | POA: Insufficient documentation

## 2017-07-03 DIAGNOSIS — I110 Hypertensive heart disease with heart failure: Secondary | ICD-10-CM | POA: Insufficient documentation

## 2017-07-03 DIAGNOSIS — G2581 Restless legs syndrome: Secondary | ICD-10-CM | POA: Diagnosis not present

## 2017-07-03 DIAGNOSIS — I2510 Atherosclerotic heart disease of native coronary artery without angina pectoris: Secondary | ICD-10-CM | POA: Diagnosis present

## 2017-07-03 DIAGNOSIS — E11319 Type 2 diabetes mellitus with unspecified diabetic retinopathy without macular edema: Secondary | ICD-10-CM | POA: Insufficient documentation

## 2017-07-03 DIAGNOSIS — E1142 Type 2 diabetes mellitus with diabetic polyneuropathy: Secondary | ICD-10-CM | POA: Diagnosis not present

## 2017-07-03 DIAGNOSIS — Z955 Presence of coronary angioplasty implant and graft: Secondary | ICD-10-CM | POA: Insufficient documentation

## 2017-07-03 DIAGNOSIS — I4891 Unspecified atrial fibrillation: Secondary | ICD-10-CM | POA: Insufficient documentation

## 2017-07-03 DIAGNOSIS — M1990 Unspecified osteoarthritis, unspecified site: Secondary | ICD-10-CM | POA: Diagnosis not present

## 2017-07-03 DIAGNOSIS — Z794 Long term (current) use of insulin: Secondary | ICD-10-CM | POA: Diagnosis not present

## 2017-07-03 DIAGNOSIS — I890 Lymphedema, not elsewhere classified: Secondary | ICD-10-CM | POA: Diagnosis not present

## 2017-07-03 DIAGNOSIS — E1122 Type 2 diabetes mellitus with diabetic chronic kidney disease: Secondary | ICD-10-CM

## 2017-07-03 DIAGNOSIS — E669 Obesity, unspecified: Secondary | ICD-10-CM | POA: Diagnosis not present

## 2017-07-03 DIAGNOSIS — E785 Hyperlipidemia, unspecified: Secondary | ICD-10-CM | POA: Insufficient documentation

## 2017-07-03 DIAGNOSIS — Z951 Presence of aortocoronary bypass graft: Secondary | ICD-10-CM | POA: Insufficient documentation

## 2017-07-03 DIAGNOSIS — I509 Heart failure, unspecified: Secondary | ICD-10-CM | POA: Diagnosis present

## 2017-07-03 DIAGNOSIS — N183 Chronic kidney disease, stage 3 (moderate): Secondary | ICD-10-CM

## 2017-07-03 DIAGNOSIS — I10 Essential (primary) hypertension: Secondary | ICD-10-CM

## 2017-07-03 LAB — GLUCOSE, CAPILLARY: GLUCOSE-CAPILLARY: 151 mg/dL — AB (ref 65–99)

## 2017-07-03 NOTE — Patient Instructions (Signed)
Continue weighing daily and call for an overnight weight gain of > 2 pounds or a weekly weight gain of >5 pounds. 

## 2017-07-03 NOTE — Progress Notes (Signed)
Patient ID: Katherine Hernandez, female    DOB: Oct 21, 1929, 83 y.o.   MRN: [id number]  HPI  Katherine Hernandez is an 83 y/o female with a history of RLS, HTN, hyperlipidemia, GERD, diabetes, CAD, arthritis and chronic heart failure.   Echo report from 05/26/17 reviewed and showed an EF of 55-65% along with mild MR.   Admitted 05/25/17 due to acute on chronic HF. Initially needed IV lasix and then transitioned to oral diuretics. New onset AF and was begun on eliquis. Cardiology consult obtained. Elevated troponin thought to be due to demand ischemia. Discharged home after 2 days.   She presents today for a follow-up visit with a chief complaint of moderate fatigue upon minimal exertion. She describes this as chronic in nature having been present for several years with varying levels of severity. She has associate palpitations, edema, light-headedness and chronic back pain along with this. She denies any chest pain, shortness of breath, weight gain or difficulty sleeping. Eliquis has made her feel "bad" in the past and she request that this medication be added to her allergies.  Past Medical History:  Diagnosis Date  . Arthritis   . CAD (coronary artery disease)    Last nuclear 02/2009 EF 72% and neg for ischemia   . CHF (congestive heart failure) ([place])   . Coronary artery disease    CAD with PTCA(cfx)fx -1993,and CABG ,2004  . Diabetes mellitus without complication ([place])   . Diabetic nephropathy ([place])   . Diabetic peripheral neuropathy ([place])   . GERD (gastroesophageal reflux disease)   . Hyperlipidemia   . Hypertension   . Obesity   . Retinopathy, diabetic, background ([place])   . RLS (restless legs syndrome)   . Urinary incontinence    Past Surgical History:  Procedure Laterality Date  . ABDOMINAL HYSTERECTOMY    . BREAST BIOPSY Left 1970   surgical exc  . BREAST BIOPSY Right 09/13/2016   path pending x 2 areas  . CHOLECYSTECTOMY  1968  . COLONOSCOPY WITH PROPOFOL N/A 01/02/2017   Procedure:  COLONOSCOPY WITH PROPOFOL;  Surgeon: Lollie Sails, MD;  Location: [facility] ENDOSCOPY;  Service: Endoscopy;  Laterality: N/A;  . CORONARY ANGIOPLASTY    . CORONARY ARTERY BYPASS GRAFT  02/2003  . ery    . ESOPHAGOGASTRODUODENOSCOPY (EGD) WITH PROPOFOL N/A 01/02/2017   Procedure: ESOPHAGOGASTRODUODENOSCOPY (EGD) WITH PROPOFOL;  Surgeon: Lollie Sails, MD;  Location[facility] ENDOSCOPY;  Service: Endoscopy;  Laterality: N/A;   Family History  Problem Relation Age of Onset  . Breast cancer Sister 70  . Breast cancer Sister   . Diabetes Mother   . Heart disease Mother   . Kidney disease Father   . Hypertension Father    Social History   Tobacco Use  . Smoking status: Former Smoker    Packs/day: 1.00    Years: 15.00    Pack years: 15.00    Types: Cigarettes    Last attempt to quit: 07/17/1978    Years since quitting: 38.9  . Smokeless tobacco: Never Used  . Tobacco comment: quit in year 1980  Substance Use Topics  . Alcohol use: No   Allergies  Allergen Reactions  . Amoxicillin   . Cefdinir   . Doxycycline   . Erythromycin   . Lisinopril   . Macrodantin [Nitrofurantoin]   . Mobic [Meloxicam]     diarrhea  . Statins   . Sulfa Antibiotics    Prior to Admission medications   Medication Sig Start Date  End Date Taking? Authorizing Provider  albuterol (PROVENTIL HFA;VENTOLIN HFA) 108 (90 Base) MCG/ACT inhaler Inhale 2 puffs every 6 (six) hours as needed into the lungs for wheezing or shortness of breath.   Yes [provider]  allopurinol (ZYLOPRIM) 100 MG tablet Take 1 tablet daily by mouth. 03/28/17 09/24/17 Yes [provider]  budesonide-formoterol (SYMBICORT) 160-4.5 MCG/ACT inhaler Inhale 2 puffs into the lungs 2 (two) times daily.   Yes [provider]  cetirizine (ZYRTEC) 10 MG tablet Take 10 mg by mouth daily.   Yes [provider]  Cyanocobalamin (B-12 COMPLIANCE INJECTION IJ) Inject as directed every 30 (thirty) days.   Yes [provider]  ergocalciferol (VITAMIN D2) 50000 units capsule Take 50,000 Units by mouth once a week.   Yes [provider]  estradiol (ESTRACE) 0.1 MG/GM vaginal cream Place 1 Applicatorful vaginally 2 (two) times a week.   Yes [provider]  ferrous sulfate 325 (65 FE) MG tablet Take 325 mg by mouth daily with breakfast.   Yes [provider]  furosemide (LASIX) 20 MG tablet Take 80 mg daily by mouth.    Yes [provider]  gabapentin (NEURONTIN) 300 MG capsule Take 300 mg 3 (three) times daily by mouth.    Yes [provider]  insulin lispro (HUMALOG) 100 UNIT/ML injection Inject 15-30 Units 3 (three) times daily before meals into the skin. 25 units breafast/lunch 30 units dinner   Yes [provider]  LORazepam (ATIVAN) 0.5 MG tablet Take 0.5 mg by mouth every 8 (eight) hours.   Yes [provider]  losartan (COZAAR) 100 MG tablet Take 1 tablet daily by mouth. 03/28/17 09/24/17 Yes [provider]  metoprolol tartrate (LOPRESSOR) 50 MG tablet Take 1 tablet (50 mg total) daily by mouth. 05/27/17  Yes Demetrios Loll, MD  nitroGLYCERIN (NITROSTAT) 0.4 MG SL tablet Place 0.4 mg under the tongue every 5 (five) minutes as needed for chest pain.   Yes [provider]  pantoprazole (PROTONIX) 40 MG tablet Take 40 mg 2 (two) times daily as needed by mouth.    Yes [provider]  rOPINIRole (REQUIP) 0.5 MG tablet Take 0.5 mg by mouth at bedtime.   Yes [provider]  sucralfate (CARAFATE) 1 g tablet Take 1 g by mouth 2 (two) times daily.   Yes [provider]  apixaban (ELIQUIS) 5 MG TABS tablet Take 1 tablet (5 mg total) 2 (two) times daily by mouth. Patient not taking: Reported on 07/03/2017 05/27/17   Demetrios Loll, MD  aspirin 81 MG tablet Take 81 mg daily by mouth.     [provider]  Insulin Glargine (LANTUS SOLOSTAR) 100 UNIT/ML Solostar Pen Inject 60-70 Units 2 (two) times daily  into the skin. 70 units in the Am, 60 units at PM    [provider]  Liraglutide (VICTOZA [place]) Inject 1.2 mg/mL at bedtime into the skin.     [provider]    Review of Systems  Constitutional: Positive for fatigue. Negative for appetite change.  HENT: Positive for rhinorrhea. Negative for congestion and sore throat.   Eyes: Negative.   Respiratory: Negative for cough, chest tightness and shortness of breath.   Cardiovascular: Positive for palpitations and leg swelling. Negative for chest pain.  Gastrointestinal: Negative for abdominal distention and abdominal pain.  Endocrine: Negative.   Genitourinary: Negative.   Musculoskeletal: Positive for back pain. Negative for neck pain.  Skin: Negative.   Allergic/Immunologic: Negative.  Neurological: Positive for light-headedness. Negative for dizziness and headaches.       "jerking sensation in hands at times"    Hematological: Negative for adenopathy. Bruises/bleeds easily.  Psychiatric/Behavioral: Negative for dysphoric mood and sleep disturbance. The patient is not nervous/anxious.    Vitals:   07/03/17 1002  BP: (!) 110/33  Pulse: 66  Resp: 18  SpO2: 100%  Weight: 188 lb 2 oz (85.3 kg)  Height: 5\' 1"  (1.549 m)   Wt Readings from Last 3 Encounters:  07/03/17 188 lb 2 oz (85.3 kg)  06/12/17 196 lb (88.9 kg)  06/06/17 198 lb 2 oz (89.9 kg)    Lab Results  Component Value Date   CREATININE 1.54 (H) 05/27/2017   CREATININE 1.36 (H) 05/26/2017   CREATININE 1.29 (H) 05/25/2017   Physical Exam  Constitutional: She is oriented to person, place, and time. She appears well-developed and well-nourished.  HENT:  Head: Normocephalic and atraumatic.  Neck: Normal range of motion. Neck supple. No JVD present.  Cardiovascular: Normal rate. An irregular rhythm present.  Pulmonary/Chest: Effort normal. She has no wheezes. She has no rales.  Abdominal: Soft. She exhibits no distension. There is no tenderness.   Musculoskeletal: She exhibits no edema or tenderness.  Neurological: She is alert and oriented to person, place, and time.  Skin: Skin is warm and dry.  Psychiatric: Her behavior is normal. Thought content normal. Her mood appears not anxious.  Nursing note and vitals reviewed.  Assessment & Plan:  1: Chronic heart failure with preserved ejection fraction- - NYHA class III - euvolemic today - weighing daily and says that her weight has gradually declined. Reminded to call for an overnight weight gain of >2 pounds or a weekly weight gain of >5 pounds - weight down 10 pounds since she was last here - not adding salt to her food and has started reading food labels for sodium content. Reminded to closely follow a 2000mg  sodium diet  - patient reports receiving flu vaccine for this season already - saw cardiologist Ubaldo Glassing) 06/06/17 - PharmD went in and reviewed medications with the patient - had a sleep study done and is waiting on bipap equipment to start wearing  2: HTN- - BP on the low side today - saw PCP (Hande) 05/25/17 - BMP from 05/27/17 reviewed and showed sodium 136, potassium 3.8 and GFR 30  3: Atrial fibrillation- - on metoprolol tartrate - patient had stopped taking apixaban as it made her feel "bad". Reports discussing this with her cardiologist who said she could remain off of it - currently irregular rhythm  4: Diabetes-  - nonfasting glucose in clinic this morning was 151 - saw endocrinologist (Solum) 06/13/17 - A1c on 05/26/17 was 7.7%  5: Lymphedema- - stage 2 - has been elevating her legs more often - wearing TED hose daily - limited in exercise due to fatigue - edema better since wearing the TED hose  Medication bottles were reviewed.  Return in 6 months or sooner for any questions/problems before then.

## 2017-07-04 ENCOUNTER — Telehealth: Payer: Self-pay | Admitting: Internal Medicine

## 2017-07-04 DIAGNOSIS — G4733 Obstructive sleep apnea (adult) (pediatric): Secondary | ICD-10-CM

## 2017-07-04 NOTE — Telephone Encounter (Signed)
Rodena Piety, I know daughter didn't want to go to [place]. Please see message below. Thanks.

## 2017-07-05 NOTE — Telephone Encounter (Signed)
lmtcb x1 for [employer]. I have asked him to contact [place] in our [place] office about this matter.

## 2017-07-06 NOTE — Telephone Encounter (Signed)
Confirmation from [facility], Omar Person, [place] got it! We are working to get her scheduled asap. Thanks!

## 2017-07-06 NOTE — Telephone Encounter (Signed)
FYI below about [facility] taking care of pt from [place].

## 2017-07-06 NOTE — Telephone Encounter (Signed)
[facility] called stating they are taking over BIPAP orders from here on   Would just like [place] to know

## 2017-07-18 ENCOUNTER — Institutional Professional Consult (permissible substitution): Payer: PRIVATE HEALTH INSURANCE | Admitting: Pulmonary Disease

## 2017-08-02 ENCOUNTER — Telehealth: Payer: Self-pay | Admitting: Internal Medicine

## 2017-08-02 NOTE — Telephone Encounter (Signed)
Pt will f/u in Feb 2019, needs PFT scheduled.

## 2017-08-02 NOTE — Telephone Encounter (Signed)
Patient needs pft prior to fu with Ram please call to set up with patient

## 2017-08-03 ENCOUNTER — Other Ambulatory Visit: Payer: Self-pay | Admitting: Internal Medicine

## 2017-08-03 DIAGNOSIS — D649 Anemia, unspecified: Secondary | ICD-10-CM

## 2017-08-03 DIAGNOSIS — D696 Thrombocytopenia, unspecified: Secondary | ICD-10-CM

## 2017-08-03 DIAGNOSIS — I5032 Chronic diastolic (congestive) heart failure: Secondary | ICD-10-CM

## 2017-08-03 NOTE — Telephone Encounter (Signed)
Katherine Hernandez is scheduled to see Dr. Ashby Dawes on 09/11/2017 @ 10:00am and her PFT is scheduled 09/04/2017 @ 11:30am Daughter is aware of both appts

## 2017-08-10 ENCOUNTER — Ambulatory Visit
Admission: RE | Admit: 2017-08-10 | Discharge: 2017-08-10 | Disposition: A | Discharge status: Home / Self Care | Payer: Medicare Other | Source: Ambulatory Visit | Attending: Internal Medicine | Admitting: Internal Medicine

## 2017-08-10 DIAGNOSIS — D71 Functional disorders of polymorphonuclear neutrophils: Secondary | ICD-10-CM | POA: Diagnosis not present

## 2017-08-10 DIAGNOSIS — D649 Anemia, unspecified: Secondary | ICD-10-CM | POA: Insufficient documentation

## 2017-08-10 DIAGNOSIS — D696 Thrombocytopenia, unspecified: Secondary | ICD-10-CM | POA: Insufficient documentation

## 2017-08-10 DIAGNOSIS — I5032 Chronic diastolic (congestive) heart failure: Secondary | ICD-10-CM | POA: Diagnosis present

## 2017-09-04 ENCOUNTER — Ambulatory Visit: Payer: Medicare Other | Attending: Internal Medicine

## 2017-09-04 DIAGNOSIS — G4719 Other hypersomnia: Secondary | ICD-10-CM

## 2017-09-04 DIAGNOSIS — J439 Emphysema, unspecified: Secondary | ICD-10-CM | POA: Insufficient documentation

## 2017-09-04 DIAGNOSIS — J449 Chronic obstructive pulmonary disease, unspecified: Secondary | ICD-10-CM

## 2017-09-04 DIAGNOSIS — G4710 Hypersomnia, unspecified: Secondary | ICD-10-CM | POA: Diagnosis not present

## 2017-09-04 MED ORDER — ALBUTEROL SULFATE (2.5 MG/3ML) 0.083% IN NEBU
2.5000 mg | INHALATION_SOLUTION | Freq: Once | RESPIRATORY_TRACT | Status: AC
  Administered 2017-09-04: 2.5 mg via RESPIRATORY_TRACT
  Filled 2017-09-04: qty 3

## 2017-09-10 ENCOUNTER — Ambulatory Visit: Payer: PRIVATE HEALTH INSURANCE | Admitting: Internal Medicine

## 2017-09-10 NOTE — Progress Notes (Signed)
[facility] Consultation      Assessment and Plan:  Obstructive sleep apnea --Her CPAP was taken away due to noncompliance, is now started on bipap.  -In lab sleep study 06/14/17; AHI equals 7.4, PLMI equals 27 not associated with arousals. -Having trouble tolerating full facemask, appears to be tolerating the pressure better.  Will refer to mask fitting clinic, will start Flonase, as nasal drainage appears to be contributing to trouble tolerating the mask.  Congestive heart failure -Noted congestive heart failure with recent hospital admission with CHF exacerbation, and atrial fibrillation.. -Continue to follow with cardiology.  Chronic bronchitis. -PFT does not show evidence of COPD/emphysema. -We will trial off of Symbicort.  Hoarseness of voice. -We will trial off of Symbicort to see if her hoarseness improves.  Orders Placed This Encounter  Procedures  . Desensitization mask fit   Return in about 3 months (around 12/09/2017).    Date: 09/10/2017  MRN# [id number] Katherine Hernandez [birth year]    Katherine Hernandez is a 84 y.o. old female seen in consultation for chief complaint of:    Chief Complaint  Patient presents with  . Sleep Apnea    Pt currently on bipap therapy, she is having some mask issues.  . Bronchitis    pt recently had PFT.    HPI:   Patient has a history of obstructive sleep apnea, she was previously on CPAP, this was taken away.  She was then sent for another sleep study which confirmed the presence of obstructive sleep apnea, the patient was started on CPAP.  When she first started on cpap, she had trouble tolerating the pressure, and she was told that it could not be changed. She occasionally wheezes, and she does this while sleeping. She was admitted to the hospital a few days after starting the cpap, her daughter notes that every day that she wore it her breathing got worse until she was struggling to breathe. She was then admitted  to the hospital with CHF. Unfortunately her home care company deemed her non-compliant and the machine was taken away.   She is now on bipap and feels that she is is doing better with it. She tolerates the pressure better, but she continues to have trouble with the mask, she is on a full face mask. She has a lot of nasal drainage which makes it hard to use the mask.  She feels that the breathing has been doing well.  She is currently taking symbicort 2 puffs twice per day,  She quit smoking remotely. She takes albuterol rarely.  **PFT 09/04/17; FVC is 96% predicted, FEV1 is 99% predicted, ratio 77%.  There is no significant improvement with bronchodilator.  Flow volume loop is within normal limits. -Lung volumes show TLC of 87%, RV/TLC ratio is normal.  DLCO is mildly reduced at 65%. - Overall pulmonary function test showed normal pulmonary functions without evidence of COPD/emphysema.  **Review of outside sleep study 04/17/17; AHI equals 24.  Auto CPAP patient was then started on CPAP, on review of download data usage greater than 4 hours was 3 out of 30 days.  Average usage on days used was 3 hours 55 minutes.  Minimum pressure was 5, max is 20.  Median pressure was 7, 95th percentile was 11, maximum was 12.  Residual AHI was 4.9. **Images personally reviewed, 05/25/17; cardiomegaly, interstitial edema consistent with congestive heart failure.  Social Hx:   Social History   Tobacco Use  . Smoking status: Former  Smoker    Packs/day: 1.00    Years: 15.00    Pack years: 15.00    Types: Cigarettes    Last attempt to quit: 07/17/1978    Years since quitting: 39.1  . Smokeless tobacco: Never Used  . Tobacco comment: quit in year 1980  Substance Use Topics  . Alcohol use: No  . Drug use: No   Medication:    Current Outpatient Medications:  .  albuterol (PROVENTIL HFA;VENTOLIN HFA) 108 (90 Base) MCG/ACT inhaler, Inhale 2 puffs every 6 (six) hours as needed into the lungs for wheezing or  shortness of breath., Disp: , Rfl:  .  allopurinol (ZYLOPRIM) 100 MG tablet, Take 1 tablet daily by mouth., Disp: , Rfl:  .  aspirin 81 MG tablet, Take 81 mg daily by mouth. , Disp: , Rfl:  .  budesonide-formoterol (SYMBICORT) 160-4.5 MCG/ACT inhaler, Inhale 2 puffs into the lungs 2 (two) times daily., Disp: , Rfl:  .  cetirizine (ZYRTEC) 10 MG tablet, Take 10 mg by mouth daily., Disp: , Rfl:  .  Cyanocobalamin (B-12 COMPLIANCE INJECTION IJ), Inject as directed every 30 (thirty) days., Disp: , Rfl:  .  ergocalciferol (VITAMIN D2) 50000 units capsule, Take 50,000 Units by mouth once a week., Disp: , Rfl:  .  estradiol (ESTRACE) 0.1 MG/GM vaginal cream, Place 1 Applicatorful vaginally 2 (two) times a week., Disp: , Rfl:  .  ferrous sulfate 325 (65 FE) MG tablet, Take 325 mg by mouth daily with breakfast., Disp: , Rfl:  .  furosemide (LASIX) 20 MG tablet, Take 80 mg daily by mouth. , Disp: , Rfl:  .  gabapentin (NEURONTIN) 300 MG capsule, Take 300 mg 3 (three) times daily by mouth. , Disp: , Rfl:  .  Insulin Glargine (LANTUS SOLOSTAR) 100 UNIT/ML Solostar Pen, Inject 60-70 Units 2 (two) times daily into the skin. 70 units in the Am, 60 units at PM, Disp: , Rfl:  .  insulin lispro (HUMALOG) 100 UNIT/ML injection, Inject 15-30 Units 3 (three) times daily before meals into the skin. 25 units breafast/lunch 30 units dinner, Disp: , Rfl:  .  Liraglutide (VICTOZA [place]), Inject 1.2 mg/mL at bedtime into the skin. , Disp: , Rfl:  .  LORazepam (ATIVAN) 0.5 MG tablet, Take 0.5 mg by mouth every 8 (eight) hours., Disp: , Rfl:  .  losartan (COZAAR) 100 MG tablet, Take 1 tablet daily by mouth., Disp: , Rfl:  .  metoprolol tartrate (LOPRESSOR) 50 MG tablet, Take 1 tablet (50 mg total) daily by mouth., Disp: 30 tablet, Rfl: 0 .  nitroGLYCERIN (NITROSTAT) 0.4 MG SL tablet, Place 0.4 mg under the tongue every 5 (five) minutes as needed for chest pain., Disp: , Rfl:  .  pantoprazole (PROTONIX) 40 MG tablet, Take 40 mg 2  (two) times daily as needed by mouth. , Disp: , Rfl:  .  rOPINIRole (REQUIP) 0.5 MG tablet, Take 0.5 mg by mouth at bedtime., Disp: , Rfl:  .  sucralfate (CARAFATE) 1 g tablet, Take 1 g by mouth 2 (two) times daily., Disp: , Rfl:    Allergies:  Amoxicillin; Cefdinir; Doxycycline; Eliquis [apixaban]; Erythromycin; Lisinopril; Macrodantin [nitrofurantoin]; Mobic [meloxicam]; Statins; and Sulfa antibiotics  Review of Systems: Gen:  Denies  fever, sweats, chills HEENT: Denies blurred vision, double vision. bleeds, sore throat Cvc:  No dizziness, chest pain. Resp:   Denies cough or sputum production, shortness of breath Gi: Denies swallowing difficulty, stomach pain. Gu:  Denies bladder incontinence, burning urine Ext:  No Joint pain, stiffness. Skin: No skin rash,  hives  Endoc:  No polyuria, polydipsia. Psych: No depression, insomnia. Other:  All other systems were reviewed with the patient and were negative other that what is mentioned in the HPI.   Physical Examination:   VS: BP 106/60 (BP Location: Left Arm, Cuff Size: Large)   Pulse 63   Resp 16   Ht 5' (1.524 m)   Wt 181 lb (82.1 kg)   SpO2 96%   BMI 35.35 kg/m   General Appearance: No distress  Neuro:without focal findings,  speech normal,  HEENT: PERRLA, EOM intact.   Pulmonary: normal breath sounds, No wheezing.  CardiovascularNormal S1,S2.  No m/r/g.   Abdomen: Benign, Soft, non-tender. Renal:  No costovertebral tenderness  GU:  No performed at this time. Endoc: No evident thyromegaly, no signs of acromegaly. Skin:   warm, no rashes, no ecchymosis  Extremities: normal, no cyanosis, clubbing.  Other findings:    LABORATORY PANEL:   CBC No results for input(s): WBC, HGB, HCT, PLT in the last 168 hours. ------------------------------------------------------------------------------------------------------------------  Chemistries  No results for input(s): NA, K, CL, CO2, GLUCOSE, BUN, CREATININE, CALCIUM, MG,  AST, ALT, ALKPHOS, BILITOT in the last 168 hours.  Invalid input(s): GFRCGP ------------------------------------------------------------------------------------------------------------------  Cardiac Enzymes No results for input(s): TROPONINI in the last 168 hours. ------------------------------------------------------------  RADIOLOGY:  No results found.     Thank  you for the consultation and for allowing [facility] to assist in the care of your patient. Our recommendations are noted above.  Please contact us if we can be of further service.   Marda Stalker, MD.  Board Certified in Internal Medicine, Pulmonary Medicine, [place], and Sleep Medicine.  [facility] Office Number: [phone]  Patricia Pesa, M.D.  Merton Border, M.D  09/10/2017

## 2017-09-11 ENCOUNTER — Ambulatory Visit (INDEPENDENT_AMBULATORY_CARE_PROVIDER_SITE_OTHER): Payer: Medicare Other | Admitting: Internal Medicine

## 2017-09-11 ENCOUNTER — Encounter: Payer: Self-pay | Admitting: Internal Medicine

## 2017-09-11 VITALS — BP 106/60 | HR 63 | Resp 16 | Ht 60.0 in | Wt 181.0 lb

## 2017-09-11 DIAGNOSIS — G4733 Obstructive sleep apnea (adult) (pediatric): Secondary | ICD-10-CM

## 2017-09-11 MED ORDER — FLUTICASONE PROPIONATE 50 MCG/ACT NA SUSP: 2.0000 | Freq: Every day | NASAL | 2 refills | Status: DC

## 2017-09-11 NOTE — Patient Instructions (Addendum)
Can use rhinocort or flonase over the counter. 2 sprays in each nostril every night.  Stop symbicort, and use albuterol when needed.  Will refer you to mask fitting clinic.

## 2017-09-19 ENCOUNTER — Ambulatory Visit (HOSPITAL_BASED_OUTPATIENT_CLINIC_OR_DEPARTMENT_OTHER): Payer: Medicare Other | Attending: Internal Medicine | Admitting: Radiology

## 2017-09-19 DIAGNOSIS — G4733 Obstructive sleep apnea (adult) (pediatric): Secondary | ICD-10-CM

## 2017-09-26 ENCOUNTER — Inpatient Hospital Stay: Payer: Medicare Other | Admitting: *Deleted

## 2017-09-26 ENCOUNTER — Encounter: Payer: Self-pay | Admitting: Oncology

## 2017-09-26 ENCOUNTER — Inpatient Hospital Stay: Payer: Medicare Other | Attending: Oncology | Admitting: Oncology

## 2017-09-26 VITALS — BP 163/61 | HR 57 | Temp 97.2°F | Resp 18 | Ht 60.0 in | Wt 182.4 lb

## 2017-09-26 DIAGNOSIS — G2581 Restless legs syndrome: Secondary | ICD-10-CM | POA: Diagnosis not present

## 2017-09-26 DIAGNOSIS — Z7982 Long term (current) use of aspirin: Secondary | ICD-10-CM | POA: Diagnosis not present

## 2017-09-26 DIAGNOSIS — I110 Hypertensive heart disease with heart failure: Secondary | ICD-10-CM | POA: Diagnosis not present

## 2017-09-26 DIAGNOSIS — Z955 Presence of coronary angioplasty implant and graft: Secondary | ICD-10-CM | POA: Diagnosis not present

## 2017-09-26 DIAGNOSIS — D696 Thrombocytopenia, unspecified: Secondary | ICD-10-CM | POA: Diagnosis present

## 2017-09-26 DIAGNOSIS — R5383 Other fatigue: Secondary | ICD-10-CM | POA: Diagnosis not present

## 2017-09-26 DIAGNOSIS — I509 Heart failure, unspecified: Secondary | ICD-10-CM | POA: Diagnosis not present

## 2017-09-26 DIAGNOSIS — K219 Gastro-esophageal reflux disease without esophagitis: Secondary | ICD-10-CM | POA: Insufficient documentation

## 2017-09-26 DIAGNOSIS — Z951 Presence of aortocoronary bypass graft: Secondary | ICD-10-CM | POA: Insufficient documentation

## 2017-09-26 DIAGNOSIS — Z87891 Personal history of nicotine dependence: Secondary | ICD-10-CM | POA: Diagnosis not present

## 2017-09-26 DIAGNOSIS — R5381 Other malaise: Secondary | ICD-10-CM | POA: Insufficient documentation

## 2017-09-26 DIAGNOSIS — Z79899 Other long term (current) drug therapy: Secondary | ICD-10-CM | POA: Diagnosis not present

## 2017-09-26 DIAGNOSIS — E1121 Type 2 diabetes mellitus with diabetic nephropathy: Secondary | ICD-10-CM | POA: Diagnosis not present

## 2017-09-26 DIAGNOSIS — E785 Hyperlipidemia, unspecified: Secondary | ICD-10-CM | POA: Insufficient documentation

## 2017-09-26 DIAGNOSIS — D649 Anemia, unspecified: Secondary | ICD-10-CM | POA: Insufficient documentation

## 2017-09-26 DIAGNOSIS — I2510 Atherosclerotic heart disease of native coronary artery without angina pectoris: Secondary | ICD-10-CM | POA: Diagnosis not present

## 2017-09-26 DIAGNOSIS — E669 Obesity, unspecified: Secondary | ICD-10-CM | POA: Diagnosis not present

## 2017-09-26 LAB — LACTATE DEHYDROGENASE: LDH: 146 U/L (ref 98–192)

## 2017-09-26 LAB — COMPREHENSIVE METABOLIC PANEL
ALK PHOS: 89 U/L (ref 38–126)
ALT: 17 U/L (ref 14–54)
ANION GAP: 10 (ref 5–15)
AST: 32 U/L (ref 15–41)
Albumin: 3.8 g/dL (ref 3.5–5.0)
BILIRUBIN TOTAL: 0.5 mg/dL (ref 0.3–1.2)
BUN: 32 mg/dL — ABNORMAL HIGH (ref 6–20)
CALCIUM: 9.6 mg/dL (ref 8.9–10.3)
CO2: 25 mmol/L (ref 22–32)
Chloride: 103 mmol/L (ref 101–111)
Creatinine, Ser: 1.46 mg/dL — ABNORMAL HIGH (ref 0.44–1.00)
GFR, EST AFRICAN AMERICAN: 37 mL/min — AB (ref >60)
GFR, EST NON AFRICAN AMERICAN: 32 mL/min — AB (ref >60)
Glucose, Bld: 176 mg/dL — ABNORMAL HIGH (ref 65–99)
POTASSIUM: 3.4 mmol/L — AB (ref 3.5–5.1)
Sodium: 138 mmol/L (ref 135–145)
Total Protein: 6.9 g/dL (ref 6.5–8.1)

## 2017-09-26 LAB — CBC WITH DIFFERENTIAL/PLATELET
Basophils Absolute: 0.1 10*3/uL (ref 0–0.1)
Basophils Relative: 1 %
Eosinophils Absolute: 0.1 10*3/uL (ref 0–0.7)
Eosinophils Relative: 2 %
HEMATOCRIT: 35.2 % (ref 35.0–47.0)
HEMOGLOBIN: 11.7 g/dL — AB (ref 12.0–16.0)
LYMPHS ABS: 2.1 10*3/uL (ref 1.0–3.6)
LYMPHS PCT: 31 %
MCH: 32.5 pg (ref 26.0–34.0)
MCHC: 33.3 g/dL (ref 32.0–36.0)
MCV: 97.5 fL (ref 80.0–100.0)
MONO ABS: 0.5 10*3/uL (ref 0.2–0.9)
MONOS PCT: 7 %
NEUTROS ABS: 3.9 10*3/uL (ref 1.4–6.5)
Neutrophils Relative %: 59 %
Platelets: 91 10*3/uL — ABNORMAL LOW (ref 150–440)
RBC: 3.61 MIL/uL — ABNORMAL LOW (ref 3.80–5.20)
RDW: 15.5 % — AB (ref 11.5–14.5)
WBC: 6.7 10*3/uL (ref 3.6–11.0)

## 2017-09-26 LAB — VITAMIN B12: Vitamin B-12: 952 pg/mL — ABNORMAL HIGH (ref 180–914)

## 2017-09-26 LAB — PATHOLOGIST SMEAR REVIEW

## 2017-09-26 NOTE — Progress Notes (Signed)
Pt here for new dx thrombocytopenia. No sx. From it thinks she had pinched nerve at left neck and shoulder and her hand became numb-she is taking PT to help. She had no petechiae and no blood in urine or stool.

## 2017-09-26 NOTE — Progress Notes (Signed)
Hematology/Oncology Consult note [facility] [phone] Fax:([phone]   Patient Care Team: Tracie Harrier, MD as PCP - General (Internal Medicine) Alisa Graff, FNP as Nurse Practitioner (Family Medicine) Ubaldo Glassing Javier Docker, MD as Consulting Physician (Cardiology) Solum, Betsey Holiday, MD as Physician Assistant (Endocrinology)  REFERRING PROVIDER: [facility].  CHIEF COMPLAINTS/PURPOSE OF CONSULTATION:  Evaluation of thrombocytopenia  HISTORY OF PRESENTING ILLNESS:  Katherine Hernandez is a  84 y.o.  female with PMH listed below who was referred to me for evaluation of thrombocytopenia.  [facility] was [facility] by [facility] for iron deficiency anemia/epigastric pain/GERD.  Patient had a workup for GI bleed with EGD/colonoscopy.   She has to taken iron supplements twice a day.  Was noted that on her CBC she has thrombocytopenia.  And referred to [place] for further evaluation.  Patient denies having any acute bleeding.  She has easy bruising. Reviewed patient's lab work at [facility].  Her hemoglobin is 11.3, platelet counts 72,000, normal differential.  Her thrombocytopenia can trace back to September 2015, at that time her counts was 95,000. Patient reports mild fatigue otherwise feeling well.  Good appetite no weight loss.  No fever chills, night sweats. Review of Systems  Constitutional: Positive for malaise/fatigue. Negative for chills, fever and weight loss.  HENT: Negative for nosebleeds.   Eyes: Negative for double vision.  Respiratory: Negative for cough, hemoptysis and sputum production.   Cardiovascular: Negative for chest pain, palpitations and leg swelling.  Gastrointestinal: Positive for heartburn. Negative for abdominal pain, blood in stool, constipation, nausea and vomiting.  Genitourinary: Negative for dysuria.  Musculoskeletal: Negative for myalgias.  Skin: Negative for rash.  Neurological: Negative for dizziness  and weakness.  Endo/Heme/Allergies: Negative for environmental allergies. Bruises/bleeds easily.  Psychiatric/Behavioral: Negative for depression.    MEDICAL HISTORY:  Past Medical History:  Diagnosis Date  . Arthritis   . CAD (coronary artery disease)    Last nuclear 02/2009 EF 72% and neg for ischemia   . CHF (congestive heart failure) ([place])   . Coronary artery disease    CAD with PTCA(cfx)fx -1993,and CABG ,2004  . Diabetes mellitus without complication ([place])   . Diabetic nephropathy ([place])   . Diabetic peripheral neuropathy ([place])   . GERD (gastroesophageal reflux disease)   . Hyperlipidemia   . Hypertension   . Obesity   . Retinopathy, diabetic, background ([place])   . RLS (restless legs syndrome)   . Thrombocytopenia ([place])   . Urinary incontinence     SURGICAL HISTORY: Past Surgical History:  Procedure Laterality Date  . ABDOMINAL HYSTERECTOMY    . BREAST BIOPSY Left 1970   surgical exc  . BREAST BIOPSY Right 09/13/2016   path pending x 2 areas  . CHOLECYSTECTOMY  1968  . COLONOSCOPY WITH PROPOFOL N/A 01/02/2017   Procedure: COLONOSCOPY WITH PROPOFOL;  Surgeon: Lollie Sails, MD;  Location: [facility] ENDOSCOPY;  Service: Endoscopy;  Laterality: N/A;  . CORONARY ANGIOPLASTY    . CORONARY ARTERY BYPASS GRAFT  02/2003  . ery    . ESOPHAGOGASTRODUODENOSCOPY (EGD) WITH PROPOFOL N/A 01/02/2017   Procedure: ESOPHAGOGASTRODUODENOSCOPY (EGD) WITH PROPOFOL;  Surgeon: Lollie Sails, MD;  Location: [facility] ENDOSCOPY;  Service: Endoscopy;  Laterality: N/A;    SOCIAL HISTORY: Social History   Socioeconomic History  . Marital status: Widowed    Spouse name: Not on file  . Number of children: 3  . Years of education: 8  . Highest education level:  8th grade  Social Needs  . Financial resource strain: Not hard at all  . Food insecurity - worry: Never true  . Food insecurity - inability: Never true  . Transportation needs - medical: No  . Transportation needs - non-medical: No   Occupational History  . Not on file  Tobacco Use  . Smoking status: Former Smoker    Packs/day: 1.00    Years: 15.00    Pack years: 15.00    Types: Cigarettes    Last attempt to quit: 07/17/1978    Years since quitting: 39.2  . Smokeless tobacco: Never Used  . Tobacco comment: quit in year 1980  Substance and Sexual Activity  . Alcohol use: No  . Drug use: No  . Sexual activity: No  Other Topics Concern  . Not on file  Social History Narrative   History of smoking cigarettes: Former smoker   Quit in year : 1980   Pack-year Hx: 40   No alcohol.   Caffeine :yes    Diet : no   Exercise :no   Occupation : unemployed, retired    No Psychologist, educational status: single,widowed   LV systolic Dysfunction   EF less than 40 >no    FAMILY HISTORY: Family History  Problem Relation Age of Onset  . Breast cancer Sister 4  . Breast cancer Sister   . Diabetes Mother   . Heart disease Mother   . Kidney disease Father   . Hypertension Father     ALLERGIES:  is allergic to amoxicillin; cefdinir; doxycycline; eliquis [apixaban]; erythromycin; lisinopril; macrodantin [nitrofurantoin]; mobic [meloxicam]; statins; and sulfa antibiotics.  MEDICATIONS:  Current Outpatient Medications  Medication Sig Dispense Refill  . albuterol (PROVENTIL HFA;VENTOLIN HFA) 108 (90 Base) MCG/ACT inhaler Inhale 2 puffs every 6 (six) hours as needed into the lungs for wheezing or shortness of breath.    . allopurinol (ZYLOPRIM) 100 MG tablet Take 1 tablet daily by mouth.    . cetirizine (ZYRTEC) 10 MG tablet Take 10 mg by mouth daily.    . Cyanocobalamin (B-12 COMPLIANCE INJECTION IJ) Inject as directed every 30 (thirty) days.    . ergocalciferol (VITAMIN D2) 50000 units capsule Take 50,000 Units by mouth once a week.    . estradiol (ESTRACE) 0.1 MG/GM vaginal cream Place 1 Applicatorful vaginally 2 (two) times a week.    . ferrous sulfate 325 (65 FE) MG tablet Take 325 mg by mouth daily with breakfast.      . fluticasone (FLONASE) 50 MCG/ACT nasal spray Place 2 sprays into both nostrils at bedtime. 16 g 2  . furosemide (LASIX) 20 MG tablet Take 60 mg by mouth daily.     Marland Kitchen gabapentin (NEURONTIN) 300 MG capsule Take 300 mg 3 (three) times daily by mouth.     . Insulin Glargine (LANTUS SOLOSTAR) 100 UNIT/ML Solostar Pen Inject 20 Units into the skin at bedtime. 70 units in the Am, 60 units at PM    . insulin lispro (HUMALOG) 100 UNIT/ML injection Inject 12 Units into the skin 3 (three) times daily before meals. 25 units breafast/lunch 30 units dinner     . LORazepam (ATIVAN) 0.5 MG tablet Take 0.5 mg by mouth every 8 (eight) hours.    Marland Kitchen losartan (COZAAR) 100 MG tablet Take 1 tablet daily by mouth.    . metoprolol tartrate (LOPRESSOR) 50 MG tablet Take 1 tablet (50 mg total) daily by mouth. 30 tablet 0  . nitroGLYCERIN (NITROSTAT) 0.4 MG SL tablet  Place 0.4 mg under the tongue every 5 (five) minutes as needed for chest pain.    . pantoprazole (PROTONIX) 40 MG tablet Take 40 mg by mouth daily.     Marland Kitchen rOPINIRole (REQUIP) 0.5 MG tablet Take 0.5 mg by mouth at bedtime.    . sucralfate (CARAFATE) 1 g tablet Take 1 g by mouth daily.     Marland Kitchen aspirin 81 MG tablet Take 81 mg daily by mouth.     . budesonide-formoterol (SYMBICORT) 160-4.5 MCG/ACT inhaler Inhale 2 puffs into the lungs 2 (two) times daily.    . Liraglutide (VICTOZA [place]) Inject 1.2 mg/mL at bedtime into the skin.      No current facility-administered medications for this visit.      PHYSICAL EXAMINATION: ECOG PERFORMANCE STATUS: 1 - Symptomatic but completely ambulatory Vitals:   09/26/17 0957  BP: (!) 163/61  Pulse: (!) 57  Resp: 18  Temp: (!) 97.2 F (36.2 C)   Filed Weights   09/26/17 0957  Weight: 182 lb 6.4 oz (82.7 kg)    Physical Exam  Constitutional: She is oriented to person, place, and time and well-developed, well-nourished, and in no distress. No distress.  HENT:  Head: Normocephalic and atraumatic.  Mouth/Throat:  Oropharynx is clear and moist. No oropharyngeal exudate.  Eyes: Conjunctivae and EOM are normal. Pupils are equal, round, and reactive to light. No scleral icterus.  Neck: Normal range of motion. Neck supple.  Cardiovascular: Normal rate and regular rhythm.  No murmur heard. Pulmonary/Chest: Effort normal. No respiratory distress. She has no wheezes.  Abdominal: She exhibits no distension. There is no tenderness.  Lymphadenopathy:    She has no cervical adenopathy.  Neurological: She is alert and oriented to person, place, and time. Coordination normal.  Skin: Skin is dry.  Psychiatric: Affect and judgment normal.     LABORATORY DATA:  I have reviewed the data as listed Lab Results  Component Value Date   WBC 6.7 09/26/2017   HGB 11.7 (L) 09/26/2017   HCT 35.2 09/26/2017   MCV 97.5 09/26/2017   PLT 91 (L) 09/26/2017   Recent Labs    05/26/17 0443 05/27/17 0439 09/26/17 1057  NA 140 136 138  K 3.6 3.8 3.4*  CL 105 99* 103  CO2 '26 26 25  ' GLUCOSE 113* 182* 176*  BUN 32* 42* 32*  CREATININE 1.36* 1.54* 1.46*  CALCIUM 9.2 9.1 9.6  GFRNONAA 34* 30* 32*  GFRAA 40* 34* 37*  PROT  --   --  6.9  ALBUMIN  --   --  3.8  AST  --   --  32  ALT  --   --  17  ALKPHOS  --   --  89  BILITOT  --   --  0.5       ASSESSMENT & PLAN:  1. Thrombocytopenia ([place])   2. Normocytic anemia    For the work up of patient's thrombocytopenia, I recommend checking CBC;CMP, LDH; pathology smear review, folate, Vitamin B12, hepatitis, HIV, ANA,  and monoclonal gammopathy workup.  Spleen is not palpable on physical examination.  Patient has already had ultrasound of the abdomen which showed normal spleen size.  Also, discussed with the patient that if no clear etiology found- bone marrow biopsy would be suggested. Currently await for the above workup.   # Patient follow-up with me in approximately 2-3  weeks to review the above results.  All questions were answered. The patient knows to call  the clinic  with any problems questions or concerns.  Return of visit: 2-3 weeks Thank you for this kind referral and the opportunity to participate in the care of this patient. A copy of today's note is routed to referring provider    Earlie Server, MD, PhD Hematology Oncology [facility] Pager- [phone] 09/26/2017

## 2017-09-27 LAB — PROTEIN ELECTROPHORESIS, SERUM
A/G Ratio: 1.3 (ref 0.7–1.7)
ALPHA-1-GLOBULIN: 0.2 g/dL (ref 0.0–0.4)
ALPHA-2-GLOBULIN: 0.8 g/dL (ref 0.4–1.0)
Albumin ELP: 3.6 g/dL (ref 2.9–4.4)
BETA GLOBULIN: 0.9 g/dL (ref 0.7–1.3)
GAMMA GLOBULIN: 0.8 g/dL (ref 0.4–1.8)
Globulin, Total: 2.7 g/dL (ref 2.2–3.9)
Total Protein ELP: 6.3 g/dL (ref 6.0–8.5)

## 2017-09-27 LAB — HEPATITIS PANEL, ACUTE
HEP A IGM: NEGATIVE
HEP B C IGM: NEGATIVE
Hepatitis B Surface Ag: NEGATIVE

## 2017-09-27 LAB — HIV ANTIBODY (ROUTINE TESTING W REFLEX): HIV Screen 4th Generation wRfx: NONREACTIVE

## 2017-10-09 ENCOUNTER — Ambulatory Visit: Payer: Medicare Other | Admitting: Family

## 2017-10-17 ENCOUNTER — Other Ambulatory Visit: Payer: PRIVATE HEALTH INSURANCE

## 2017-10-17 ENCOUNTER — Ambulatory Visit: Payer: PRIVATE HEALTH INSURANCE | Admitting: Oncology

## 2017-10-22 ENCOUNTER — Encounter: Payer: Self-pay | Admitting: Oncology

## 2017-10-22 ENCOUNTER — Inpatient Hospital Stay: Payer: Medicare Other | Attending: Oncology | Admitting: Oncology

## 2017-10-22 ENCOUNTER — Inpatient Hospital Stay: Payer: Medicare Other

## 2017-10-22 ENCOUNTER — Other Ambulatory Visit: Payer: Self-pay

## 2017-10-22 VITALS — BP 184/66 | HR 70 | Temp 97.8°F | Wt 177.6 lb

## 2017-10-22 DIAGNOSIS — I110 Hypertensive heart disease with heart failure: Secondary | ICD-10-CM | POA: Diagnosis not present

## 2017-10-22 DIAGNOSIS — I509 Heart failure, unspecified: Secondary | ICD-10-CM | POA: Diagnosis not present

## 2017-10-22 DIAGNOSIS — K219 Gastro-esophageal reflux disease without esophagitis: Secondary | ICD-10-CM | POA: Insufficient documentation

## 2017-10-22 DIAGNOSIS — Z951 Presence of aortocoronary bypass graft: Secondary | ICD-10-CM | POA: Diagnosis not present

## 2017-10-22 DIAGNOSIS — Z79899 Other long term (current) drug therapy: Secondary | ICD-10-CM | POA: Diagnosis not present

## 2017-10-22 DIAGNOSIS — E1142 Type 2 diabetes mellitus with diabetic polyneuropathy: Secondary | ICD-10-CM | POA: Diagnosis not present

## 2017-10-22 DIAGNOSIS — G2581 Restless legs syndrome: Secondary | ICD-10-CM | POA: Diagnosis not present

## 2017-10-22 DIAGNOSIS — E669 Obesity, unspecified: Secondary | ICD-10-CM | POA: Diagnosis not present

## 2017-10-22 DIAGNOSIS — Z794 Long term (current) use of insulin: Secondary | ICD-10-CM | POA: Insufficient documentation

## 2017-10-22 DIAGNOSIS — Z87891 Personal history of nicotine dependence: Secondary | ICD-10-CM | POA: Insufficient documentation

## 2017-10-22 DIAGNOSIS — D696 Thrombocytopenia, unspecified: Secondary | ICD-10-CM

## 2017-10-22 DIAGNOSIS — I2510 Atherosclerotic heart disease of native coronary artery without angina pectoris: Secondary | ICD-10-CM | POA: Insufficient documentation

## 2017-10-22 DIAGNOSIS — Z7982 Long term (current) use of aspirin: Secondary | ICD-10-CM | POA: Diagnosis not present

## 2017-10-22 DIAGNOSIS — Z955 Presence of coronary angioplasty implant and graft: Secondary | ICD-10-CM | POA: Diagnosis not present

## 2017-10-22 DIAGNOSIS — D649 Anemia, unspecified: Secondary | ICD-10-CM | POA: Diagnosis not present

## 2017-10-22 DIAGNOSIS — E785 Hyperlipidemia, unspecified: Secondary | ICD-10-CM | POA: Diagnosis not present

## 2017-10-22 LAB — FOLATE: Folate: 6.8 ng/mL (ref >5.9)

## 2017-10-22 NOTE — Progress Notes (Signed)
Patient here today for follow up.   

## 2017-10-22 NOTE — Progress Notes (Signed)
Hematology/Oncology  Follow up note [facility] [phone] Fax:(336) [phone]   Patient Care Team: Tracie Harrier, MD as PCP - General (Internal Medicine) Alisa Graff, FNP as Nurse Practitioner (Family Medicine) Ubaldo Glassing Javier Docker, MD as Consulting Physician (Cardiology) Solum, Betsey Holiday, MD as Physician Assistant (Endocrinology)  REFERRING PROVIDER: [facility].  CHIEF COMPLAINTS/PURPOSE OF CONSULTATION:  Evaluation of thrombocytopenia  HISTORY OF PRESENTING ILLNESS:  Katherine Hernandez is a  84 y.o.  female with PMH listed below who was referred to me for evaluation of thrombocytopenia.  Patient was [facility] by [facility] for iron deficiency anemia/epigastric pain/GERD.  Patient had a workup for GI bleed with EGD/colonoscopy.   She has to taken iron supplements twice a day.  Was noted that on her CBC she has thrombocytopenia.  And referred to [place] for further evaluation.  Patient denies having any acute bleeding.  She has easy bruising. Reviewed patient's lab work at [facility].  Her hemoglobin is 11.3, platelet counts 72,000, normal differential.  Her thrombocytopenia can trace back to September 2015, at that time her counts was 95,000. Patient reports mild fatigue otherwise feeling well.  Good appetite no weight loss.  No fever chills, night sweats.  INTERVAL HISTORY Katherine Hernandez is a 84 y.o. female who has above history reviewed by me today presents for follow up visit for management of thrombocytopenia. She was accompanied by her daughter.   US abdomen showed no hepatosplenomegaly.  Normal B12, folate, negative hepatitis, HIV titer.   reports easy bruising, also have left hand and left shoulder pain which needs steroid injections.   also reports that her sister was found to be a carrier of "fanconi disease"   Review of Systems  Constitutional: Positive for malaise/fatigue. Negative for chills, fever and weight loss.    HENT: Negative for nosebleeds.   Eyes: Negative for double vision.  Respiratory: Negative for cough, hemoptysis and sputum production.   Cardiovascular: Negative for chest pain, palpitations and leg swelling.  Gastrointestinal: Positive for heartburn. Negative for abdominal pain, blood in stool, constipation, nausea and vomiting.  Genitourinary: Negative for dysuria.  Musculoskeletal: Negative for myalgias.  Skin: Negative for rash.  Neurological: Negative for dizziness and weakness.  Endo/Heme/Allergies: Negative for environmental allergies. Bruises/bleeds easily.  Psychiatric/Behavioral: Negative for depression.    MEDICAL HISTORY:  Past Medical History:  Diagnosis Date  . Arthritis   . CAD (coronary artery disease)    Last nuclear 02/2009 EF 72% and neg for ischemia   . CHF (congestive heart failure) ([place])   . Coronary artery disease    CAD with PTCA(cfx)fx -1993,and CABG ,2004  . Diabetes mellitus without complication ([place])   . Diabetic nephropathy ([place])   . Diabetic peripheral neuropathy ([place])   . GERD (gastroesophageal reflux disease)   . Hyperlipidemia   . Hypertension   . Obesity   . Retinopathy, diabetic, background ([place])   . RLS (restless legs syndrome)   . Thrombocytopenia ([place])   . Urinary incontinence     SURGICAL HISTORY: Past Surgical History:  Procedure Laterality Date  . ABDOMINAL HYSTERECTOMY    . BREAST BIOPSY Left 1970   surgical exc  . BREAST BIOPSY Right 09/13/2016   path pending x 2 areas  . CHOLECYSTECTOMY  1968  . COLONOSCOPY WITH PROPOFOL N/A 01/02/2017   Procedure: COLONOSCOPY WITH PROPOFOL;  Surgeon: Lollie Sails, MD;  Location: [facility] ENDOSCOPY;  Service: Endoscopy;  Laterality: N/A;  . CORONARY ANGIOPLASTY    .  CORONARY ARTERY BYPASS GRAFT  02/2003  . ery    . ESOPHAGOGASTRODUODENOSCOPY (EGD) WITH PROPOFOL N/A 01/02/2017   Procedure: ESOPHAGOGASTRODUODENOSCOPY (EGD) WITH PROPOFOL;  Surgeon: Lollie Sails, MD;  Location: [facility]  ENDOSCOPY;  Service: Endoscopy;  Laterality: N/A;    SOCIAL HISTORY: Social History   Socioeconomic History  . Marital status: Widowed    Spouse name: Not on file  . Number of children: 3  . Years of education: 8  . Highest education level: 8th grade  Occupational History  . Not on file  Social Needs  . Financial resource strain: Not hard at all  . Food insecurity:    Worry: Never true    Inability: Never true  . Transportation needs:    Medical: No    Non-medical: No  Tobacco Use  . Smoking status: Former Smoker    Packs/day: 1.00    Years: 15.00    Pack years: 15.00    Types: Cigarettes    Last attempt to quit: 07/17/1978    Years since quitting: 39.2  . Smokeless tobacco: Never Used  . Tobacco comment: quit in year 1980  Substance and Sexual Activity  . Alcohol use: No  . Drug use: No  . Sexual activity: Never  Lifestyle  . Physical activity:    Days per week: 0 days    Minutes per session: 0 min  . Stress: To some extent  Relationships  . Social connections:    Talks on phone: Three times a week    Gets together: More than three times a week    Attends religious service: More than 4 times per year    Active member of club or organization: No    Attends meetings of clubs or organizations: Never    Relationship status: Widowed  . Intimate partner violence:    Fear of current or ex partner: Patient refused    Emotionally abused: Patient refused    Physically abused: Patient refused    Forced sexual activity: Patient refused  Other Topics Concern  . Not on file  Social History Narrative   History of smoking cigarettes: Former smoker   Quit in year : 1980   Pack-year Hx: 40   No alcohol.   Caffeine :yes    Diet : no   Exercise :no   Occupation : unemployed, retired    No Psychologist, educational status: single,widowed   LV systolic Dysfunction   EF less than 40 >no    FAMILY HISTORY: Family History  Problem Relation Age of Onset  . Breast cancer  Sister 46  . Breast cancer Sister   . Diabetes Mother   . Heart disease Mother   . Kidney disease Father   . Hypertension Father     ALLERGIES:  is allergic to amoxicillin; cefdinir; doxycycline; eliquis [apixaban]; erythromycin; lisinopril; macrodantin [nitrofurantoin]; mobic [meloxicam]; statins; and sulfa antibiotics.  MEDICATIONS:  Current Outpatient Medications  Medication Sig Dispense Refill  . albuterol (PROVENTIL HFA;VENTOLIN HFA) 108 (90 Base) MCG/ACT inhaler Inhale 2 puffs every 6 (six) hours as needed into the lungs for wheezing or shortness of breath.    Marland Kitchen aspirin 81 MG tablet Take 81 mg daily by mouth.     . budesonide-formoterol (SYMBICORT) 160-4.5 MCG/ACT inhaler Inhale 2 puffs into the lungs 2 (two) times daily.    . cetirizine (ZYRTEC) 10 MG tablet Take 10 mg by mouth daily.    . Cyanocobalamin (B-12 COMPLIANCE INJECTION IJ) Inject as directed every 30 (thirty)  days.    . ergocalciferol (VITAMIN D2) 50000 units capsule Take 50,000 Units by mouth once a week.    . estradiol (ESTRACE) 0.1 MG/GM vaginal cream Place 1 Applicatorful vaginally 2 (two) times a week.    . ferrous sulfate 325 (65 FE) MG tablet Take 325 mg by mouth daily with breakfast.    . fluticasone (FLONASE) 50 MCG/ACT nasal spray Place 2 sprays into both nostrils at bedtime. 16 g 2  . furosemide (LASIX) 20 MG tablet Take 60 mg by mouth daily.     Marland Kitchen gabapentin (NEURONTIN) 300 MG capsule Take 300 mg 3 (three) times daily by mouth.     . Insulin Glargine (LANTUS SOLOSTAR) 100 UNIT/ML Solostar Pen Inject 20 Units into the skin at bedtime. 70 units in the Am, 60 units at PM    . insulin lispro (HUMALOG) 100 UNIT/ML injection Inject 12 Units into the skin 3 (three) times daily before meals. 25 units breafast/lunch 30 units dinner     . Liraglutide (VICTOZA [place]) Inject 1.2 mg/mL at bedtime into the skin.     Marland Kitchen LORazepam (ATIVAN) 0.5 MG tablet Take 0.5 mg by mouth every 8 (eight) hours.    . metoprolol tartrate  (LOPRESSOR) 50 MG tablet Take 1 tablet (50 mg total) daily by mouth. 30 tablet 0  . nitroGLYCERIN (NITROSTAT) 0.4 MG SL tablet Place 0.4 mg under the tongue every 5 (five) minutes as needed for chest pain.    . pantoprazole (PROTONIX) 40 MG tablet Take 40 mg by mouth daily.     Marland Kitchen rOPINIRole (REQUIP) 0.5 MG tablet Take 0.5 mg by mouth at bedtime.    . sucralfate (CARAFATE) 1 g tablet Take 1 g by mouth daily.     Marland Kitchen allopurinol (ZYLOPRIM) 100 MG tablet Take 1 tablet daily by mouth.    . losartan (COZAAR) 100 MG tablet Take 1 tablet daily by mouth.     No current facility-administered medications for this visit.      PHYSICAL EXAMINATION: ECOG PERFORMANCE STATUS: 1 - Symptomatic but completely ambulatory Vitals:   10/22/17 1012  BP: (!) 184/66  Pulse: 70  Temp: 97.8 F (36.6 C)   Filed Weights   10/22/17 1012  Weight: 177 lb 9 oz (80.5 kg)    Physical Exam  Constitutional: She is oriented to person, place, and time and well-developed, well-nourished, and in no distress. No distress.  HENT:  Head: Normocephalic and atraumatic.  Mouth/Throat: Oropharynx is clear and moist. No oropharyngeal exudate.  Eyes: Pupils are equal, round, and reactive to light. Conjunctivae and EOM are normal. No scleral icterus.  Neck: Normal range of motion. Neck supple.  Cardiovascular: Normal rate and regular rhythm.  No murmur heard. Pulmonary/Chest: Effort normal. No respiratory distress. She has no wheezes.  Abdominal: She exhibits no distension. There is no tenderness.  Musculoskeletal: Normal range of motion. She exhibits no edema.  Lymphadenopathy:    She has no cervical adenopathy.  Neurological: She is alert and oriented to person, place, and time. Coordination normal.  Skin: Skin is dry. No erythema.  Small bruises.   Psychiatric: Affect and judgment normal.     LABORATORY DATA:  I have reviewed the data as listed Lab Results  Component Value Date   WBC 6.7 09/26/2017   HGB 11.7 (L)  09/26/2017   HCT 35.2 09/26/2017   MCV 97.5 09/26/2017   PLT 91 (L) 09/26/2017   Recent Labs    05/26/17 0443 05/27/17 0439 09/26/17 1057  NA  140 136 138  K 3.6 3.8 3.4*  CL 105 99* 103  CO2 '26 26 25  ' GLUCOSE 113* 182* 176*  BUN 32* 42* 32*  CREATININE 1.36* 1.54* 1.46*  CALCIUM 9.2 9.1 9.6  GFRNONAA 34* 30* 32*  GFRAA 40* 34* 37*  PROT  --   --  6.9  ALBUMIN  --   --  3.8  AST  --   --  32  ALT  --   --  17  ALKPHOS  --   --  89  BILITOT  --   --  0.5       ASSESSMENT & PLAN:  1. Thrombocytopenia ([place])   2. Normocytic anemia      # Discussed with patient  about the lab results.  She does not have any folate or B12 deficiency, negative for hepatitis and HIV antibodies, ANA is pending.  No splenomegaly.   discussed with the patient that if no clear etiology found- bone marrow biopsy would be suggested.  Patient voices understanding. Bone marrow procedure was explained to patient in detail.   Will proceed with bone marrow biopsy, concerning for underlying bone marrow disorder, etc, MDS.   # Patient follow-up with me in approximately 6 weeks to review the above results.  All questions were answered. The patient knows to call the clinic with any problems questions or concerns.  Return of visit: 6 weeks Total face to face encounter time for this patient visit was 25 min. >50% of the time was  spent in counseling and coordination of care.   Earlie Server, MD, PhD Hematology Oncology [facility] Pager- [phone] 10/22/2017

## 2017-10-23 LAB — ANA W/REFLEX: Anti Nuclear Antibody(ANA): NEGATIVE

## 2017-10-29 ENCOUNTER — Other Ambulatory Visit: Payer: Self-pay | Admitting: Internal Medicine

## 2017-10-29 DIAGNOSIS — Z1231 Encounter for screening mammogram for malignant neoplasm of breast: Secondary | ICD-10-CM

## 2017-10-30 ENCOUNTER — Other Ambulatory Visit: Payer: Self-pay | Admitting: Student

## 2017-10-31 ENCOUNTER — Ambulatory Visit
Admission: RE | Admit: 2017-10-31 | Discharge: 2017-10-31 | Disposition: A | Discharge status: Home / Self Care | Payer: Medicare Other | Source: Ambulatory Visit | Attending: Oncology | Admitting: Oncology

## 2017-10-31 ENCOUNTER — Other Ambulatory Visit (HOSPITAL_COMMUNITY)
Admission: RE | Admit: 2017-10-31 | Disposition: A | Discharge status: Home / Self Care | Payer: PRIVATE HEALTH INSURANCE | Source: Ambulatory Visit | Attending: Oncology | Admitting: Oncology

## 2017-10-31 DIAGNOSIS — E1142 Type 2 diabetes mellitus with diabetic polyneuropathy: Secondary | ICD-10-CM | POA: Insufficient documentation

## 2017-10-31 DIAGNOSIS — Z881 Allergy status to other antibiotic agents status: Secondary | ICD-10-CM | POA: Insufficient documentation

## 2017-10-31 DIAGNOSIS — E785 Hyperlipidemia, unspecified: Secondary | ICD-10-CM | POA: Insufficient documentation

## 2017-10-31 DIAGNOSIS — Z7982 Long term (current) use of aspirin: Secondary | ICD-10-CM | POA: Insufficient documentation

## 2017-10-31 DIAGNOSIS — Z794 Long term (current) use of insulin: Secondary | ICD-10-CM | POA: Insufficient documentation

## 2017-10-31 DIAGNOSIS — Z90710 Acquired absence of both cervix and uterus: Secondary | ICD-10-CM | POA: Diagnosis not present

## 2017-10-31 DIAGNOSIS — E11319 Type 2 diabetes mellitus with unspecified diabetic retinopathy without macular edema: Secondary | ICD-10-CM | POA: Insufficient documentation

## 2017-10-31 DIAGNOSIS — I2510 Atherosclerotic heart disease of native coronary artery without angina pectoris: Secondary | ICD-10-CM | POA: Insufficient documentation

## 2017-10-31 DIAGNOSIS — Z888 Allergy status to other drugs, medicaments and biological substances status: Secondary | ICD-10-CM | POA: Diagnosis not present

## 2017-10-31 DIAGNOSIS — Z882 Allergy status to sulfonamides status: Secondary | ICD-10-CM | POA: Diagnosis not present

## 2017-10-31 DIAGNOSIS — Z955 Presence of coronary angioplasty implant and graft: Secondary | ICD-10-CM | POA: Insufficient documentation

## 2017-10-31 DIAGNOSIS — Z951 Presence of aortocoronary bypass graft: Secondary | ICD-10-CM | POA: Insufficient documentation

## 2017-10-31 DIAGNOSIS — I509 Heart failure, unspecified: Secondary | ICD-10-CM | POA: Diagnosis not present

## 2017-10-31 DIAGNOSIS — Z880 Allergy status to penicillin: Secondary | ICD-10-CM | POA: Insufficient documentation

## 2017-10-31 DIAGNOSIS — I110 Hypertensive heart disease with heart failure: Secondary | ICD-10-CM | POA: Insufficient documentation

## 2017-10-31 DIAGNOSIS — Z9889 Other specified postprocedural states: Secondary | ICD-10-CM | POA: Insufficient documentation

## 2017-10-31 DIAGNOSIS — G2581 Restless legs syndrome: Secondary | ICD-10-CM | POA: Insufficient documentation

## 2017-10-31 DIAGNOSIS — D649 Anemia, unspecified: Secondary | ICD-10-CM | POA: Diagnosis not present

## 2017-10-31 DIAGNOSIS — D696 Thrombocytopenia, unspecified: Secondary | ICD-10-CM | POA: Insufficient documentation

## 2017-10-31 DIAGNOSIS — Z803 Family history of malignant neoplasm of breast: Secondary | ICD-10-CM | POA: Insufficient documentation

## 2017-10-31 DIAGNOSIS — Z9049 Acquired absence of other specified parts of digestive tract: Secondary | ICD-10-CM | POA: Diagnosis not present

## 2017-10-31 DIAGNOSIS — E1121 Type 2 diabetes mellitus with diabetic nephropathy: Secondary | ICD-10-CM | POA: Insufficient documentation

## 2017-10-31 DIAGNOSIS — Q799 Congenital malformation of musculoskeletal system, unspecified: Secondary | ICD-10-CM | POA: Insufficient documentation

## 2017-10-31 DIAGNOSIS — K219 Gastro-esophageal reflux disease without esophagitis: Secondary | ICD-10-CM | POA: Insufficient documentation

## 2017-10-31 DIAGNOSIS — E669 Obesity, unspecified: Secondary | ICD-10-CM | POA: Diagnosis not present

## 2017-10-31 DIAGNOSIS — Z841 Family history of disorders of kidney and ureter: Secondary | ICD-10-CM | POA: Insufficient documentation

## 2017-10-31 DIAGNOSIS — Z79899 Other long term (current) drug therapy: Secondary | ICD-10-CM | POA: Diagnosis not present

## 2017-10-31 DIAGNOSIS — Z833 Family history of diabetes mellitus: Secondary | ICD-10-CM | POA: Insufficient documentation

## 2017-10-31 DIAGNOSIS — Z8249 Family history of ischemic heart disease and other diseases of the circulatory system: Secondary | ICD-10-CM | POA: Insufficient documentation

## 2017-10-31 DIAGNOSIS — Z87891 Personal history of nicotine dependence: Secondary | ICD-10-CM | POA: Insufficient documentation

## 2017-10-31 LAB — CBC WITH DIFFERENTIAL/PLATELET
Basophils Absolute: 0 10*3/uL (ref 0–0.1)
Basophils Relative: 1 %
EOS ABS: 0.2 10*3/uL (ref 0–0.7)
EOS PCT: 3 %
HCT: 37.6 % (ref 35.0–47.0)
Hemoglobin: 12.5 g/dL (ref 12.0–16.0)
Lymphocytes Relative: 30 %
Lymphs Abs: 1.9 10*3/uL (ref 1.0–3.6)
MCH: 32.4 pg (ref 26.0–34.0)
MCHC: 33.2 g/dL (ref 32.0–36.0)
MCV: 97.4 fL (ref 80.0–100.0)
MONO ABS: 0.5 10*3/uL (ref 0.2–0.9)
MONOS PCT: 9 %
Neutro Abs: 3.6 10*3/uL (ref 1.4–6.5)
Neutrophils Relative %: 57 %
PLATELETS: 81 10*3/uL — AB (ref 150–440)
RBC: 3.86 MIL/uL (ref 3.80–5.20)
RDW: 13.8 % (ref 11.5–14.5)
WBC: 6.2 10*3/uL (ref 3.6–11.0)

## 2017-10-31 LAB — PROTIME-INR
INR: 1.19
Prothrombin Time: 15 seconds (ref 11.4–15.2)

## 2017-10-31 LAB — APTT: APTT: 31 s (ref 24–36)

## 2017-10-31 MED ORDER — HEPARIN SOD (PORK) LOCK FLUSH 100 UNIT/ML IV SOLN
INTRAVENOUS | Status: AC
  Filled 2017-10-31: qty 5

## 2017-10-31 MED ORDER — MIDAZOLAM HCL 5 MG/5ML IJ SOLN
INTRAMUSCULAR | Status: AC | PRN
  Administered 2017-10-31: 1 mg via INTRAVENOUS
  Administered 2017-10-31 (×3): 0.5 mg via INTRAVENOUS

## 2017-10-31 MED ORDER — FENTANYL CITRATE (PF) 100 MCG/2ML IJ SOLN
INTRAMUSCULAR | Status: AC
  Filled 2017-10-31: qty 4

## 2017-10-31 MED ORDER — LIDOCAINE HCL (PF) 1 % IJ SOLN
INTRAMUSCULAR | Status: AC | PRN
  Administered 2017-10-31: 17 mL

## 2017-10-31 MED ORDER — FENTANYL CITRATE (PF) 100 MCG/2ML IJ SOLN
INTRAMUSCULAR | Status: AC | PRN
  Administered 2017-10-31: 50 ug via INTRAVENOUS
  Administered 2017-10-31: 25 ug via INTRAVENOUS

## 2017-10-31 MED ORDER — SODIUM CHLORIDE 0.9 % IV SOLN
INTRAVENOUS | Status: DC
  Administered 2017-10-31: 09:00:00 via INTRAVENOUS

## 2017-10-31 MED ORDER — MIDAZOLAM HCL 5 MG/5ML IJ SOLN
INTRAMUSCULAR | Status: AC
  Filled 2017-10-31: qty 5

## 2017-10-31 NOTE — H&P (Signed)
Chief Complaint: Patient was seen in consultation today for CT-guided biopsy at the request of Yu,Zhou  Referring Physician(s): Yu,Zhou  Patient Status: [facility] - Out-pt  History of Present Illness: Katherine Hernandez is a 84 y.o. female with thrombocytopenia and normocytic anemia.  Request for a bone marrow biopsy.  Patient has no complaints today.  Patient is accompanied by her daughter.  Past Medical History:  Diagnosis Date  . Arthritis   . CAD (coronary artery disease)    Last nuclear 02/2009 EF 72% and neg for ischemia   . CHF (congestive heart failure) ([place])   . Coronary artery disease    CAD with PTCA(cfx)fx -1993,and CABG ,2004  . Diabetes mellitus without complication ([place])   . Diabetic nephropathy ([place])   . Diabetic peripheral neuropathy ([place])   . GERD (gastroesophageal reflux disease)   . Hyperlipidemia   . Hypertension   . Obesity   . Retinopathy, diabetic, background ([place])   . RLS (restless legs syndrome)   . Thrombocytopenia ([place])   . Urinary incontinence     Past Surgical History:  Procedure Laterality Date  . ABDOMINAL HYSTERECTOMY    . BREAST BIOPSY Left 1970   surgical exc  . BREAST BIOPSY Right 09/13/2016   path pending x 2 areas  . CHOLECYSTECTOMY  1968  . COLONOSCOPY WITH PROPOFOL N/A 01/02/2017   Procedure: COLONOSCOPY WITH PROPOFOL;  Surgeon: Lollie Sails, MD;  Location: [facility] ENDOSCOPY;  Service: Endoscopy;  Laterality: N/A;  . CORONARY ANGIOPLASTY    . CORONARY ARTERY BYPASS GRAFT  02/2003  . ery    . ESOPHAGOGASTRODUODENOSCOPY (EGD) WITH PROPOFOL N/A 01/02/2017   Procedure: ESOPHAGOGASTRODUODENOSCOPY (EGD) WITH PROPOFOL;  Surgeon: Lollie Sails, MD;  Location: [facility] ENDOSCOPY;  Service: Endoscopy;  Laterality: N/A;    Allergies: Amoxicillin; Cefdinir; Doxycycline; Eliquis [apixaban]; Erythromycin; Lisinopril; Macrodantin [nitrofurantoin]; Mobic [meloxicam]; Statins; and Sulfa antibiotics  Medications: Prior to Admission medications    Medication Sig Start Date End Date Taking? Authorizing Provider  albuterol (PROVENTIL HFA;VENTOLIN HFA) 108 (90 Base) MCG/ACT inhaler Inhale 2 puffs every 6 (six) hours as needed into the lungs for wheezing or shortness of breath.    [provider]  allopurinol (ZYLOPRIM) 100 MG tablet Take 1 tablet daily by mouth. 03/28/17 09/26/17  [provider]  aspirin 81 MG tablet Take 81 mg daily by mouth.     [provider]  budesonide-formoterol (SYMBICORT) 160-4.5 MCG/ACT inhaler Inhale 2 puffs into the lungs 2 (two) times daily.    [provider]  cetirizine (ZYRTEC) 10 MG tablet Take 10 mg by mouth daily.    [provider]  Cyanocobalamin (B-12 COMPLIANCE INJECTION IJ) Inject as directed every 30 (thirty) days.    [provider]  ergocalciferol (VITAMIN D2) 50000 units capsule Take 50,000 Units by mouth once a week.    [provider]  estradiol (ESTRACE) 0.1 MG/GM vaginal cream Place 1 Applicatorful vaginally 2 (two) times a week.    [provider]  ferrous sulfate 325 (65 FE) MG tablet Take 325 mg by mouth daily with breakfast.    [provider]  fluticasone (FLONASE) 50 MCG/ACT nasal spray Place 2 sprays into both nostrils at bedtime. 09/11/17   Laverle Hobby, MD  furosemide (LASIX) 20 MG tablet Take 60 mg by mouth daily.     [provider]  gabapentin (NEURONTIN) 300 MG capsule Take 300 mg 3 (three) times daily by mouth.     [provider]  Insulin Glargine (  LANTUS SOLOSTAR) 100 UNIT/ML Solostar Pen Inject 20 Units into the skin at bedtime. 70 units in the Am, 60 units at PM    [provider]  insulin lispro (HUMALOG) 100 UNIT/ML injection Inject 12 Units into the skin 3 (three) times daily before meals. 25 units breafast/lunch 30 units dinner     [provider]  Liraglutide (VICTOZA [place]) Inject 1.2 mg/mL at bedtime into the skin.     [provider]    LORazepam (ATIVAN) 0.5 MG tablet Take 0.5 mg by mouth every 8 (eight) hours.    [provider]  losartan (COZAAR) 100 MG tablet Take 1 tablet daily by mouth. 03/28/17 09/26/17  [provider]  metoprolol tartrate (LOPRESSOR) 50 MG tablet Take 1 tablet (50 mg total) daily by mouth. 05/27/17   Demetrios Loll, MD  nitroGLYCERIN (NITROSTAT) 0.4 MG SL tablet Place 0.4 mg under the tongue every 5 (five) minutes as needed for chest pain.    [provider]  pantoprazole (PROTONIX) 40 MG tablet Take 40 mg by mouth daily.     [provider]  rOPINIRole (REQUIP) 0.5 MG tablet Take 0.5 mg by mouth at bedtime.    [provider]  sucralfate (CARAFATE) 1 g tablet Take 1 g by mouth daily.     [provider]     Family History  Problem Relation Age of Onset  . Breast cancer Sister 28  . Breast cancer Sister   . Diabetes Mother   . Heart disease Mother   . Kidney disease Father   . Hypertension Father   . Fanconi anemia Sister     Social History   Socioeconomic History  . Marital status: Widowed    Spouse name: Not on file  . Number of children: 3  . Years of education: 8  . Highest education level: 8th grade  Occupational History  . Not on file  Social Needs  . Financial resource strain: Not hard at all  . Food insecurity:    Worry: Never true    Inability: Never true  . Transportation needs:    Medical: No    Non-medical: No  Tobacco Use  . Smoking status: Former Smoker    Packs/day: 1.00    Years: 15.00    Pack years: 15.00    Types: Cigarettes    Last attempt to quit: 07/17/1978    Years since quitting: 39.3  . Smokeless tobacco: Never Used  . Tobacco comment: quit in year 1980  Substance and Sexual Activity  . Alcohol use: No  . Drug use: No  . Sexual activity: Never  Lifestyle  . Physical activity:    Days per week: 0 days    Minutes per session: 0 min  . Stress: To some extent  Relationships  . Social connections:     Talks on phone: Three times a week    Gets together: More than three times a week    Attends religious service: More than 4 times per year    Active member of club or organization: No    Attends meetings of clubs or organizations: Never    Relationship status: Widowed  Other Topics Concern  . Not on file  Social History Narrative   History of smoking cigarettes: Former smoker   Quit in year : 1980   Pack-year Hx: 40   No alcohol.   Caffeine :yes    Diet : no   Exercise :no   Occupation : unemployed,  retired    No Education   Martial status: single,widowed   LV systolic Dysfunction   EF less than 40 >no    Review of Systems  Constitutional: Negative for chills and fever.  Respiratory: Negative.   Cardiovascular: Negative.   Gastrointestinal:       Loose stools.  Neurological: Negative.     Vital Signs: BP (!) 178/55   Pulse (!) 58   Temp 99.2 F (37.3 C) (Oral)   Resp (!) 21   Ht '5\' 1"'  (1.549 m)   SpO2 97%   BMI 33.55 kg/m   Physical Exam  Constitutional: She is oriented to person, place, and time. No distress.  HENT:  Mouth/Throat: Oropharynx is clear and moist.  Cardiovascular: Normal rate, regular rhythm and normal heart sounds.  Pulmonary/Chest: Effort normal and breath sounds normal.  Abdominal: Soft. She exhibits no distension.  Neurological: She is alert and oriented to person, place, and time.    Imaging: No results found.  Labs:  CBC: Recent Labs    05/25/17 1659 05/26/17 0443 09/26/17 1057  WBC 7.7 7.2 6.7  HGB 12.2 11.7* 11.7*  HCT 37.6 34.9* 35.2  PLT 89* 84* 91*    COAGS: No results for input(s): INR, APTT in the last 8760 hours.  BMP: Recent Labs    05/25/17 1659 05/26/17 0443 05/27/17 0439 09/26/17 1057  NA 141 140 136 138  K 3.4* 3.6 3.8 3.4*  CL 104 105 99* 103  CO2 '27 26 26 25  ' GLUCOSE 91 113* 182* 176*  BUN 31* 32* 42* 32*  CALCIUM 9.7 9.2 9.1 9.6  CREATININE 1.29* 1.36* 1.54* 1.46*  GFRNONAA 37* 34* 30* 32*   GFRAA 43* 40* 34* 37*    LIVER FUNCTION TESTS: Recent Labs    09/26/17 1057  BILITOT 0.5  AST 32  ALT 17  ALKPHOS 89  PROT 6.9  ALBUMIN 3.8    TUMOR MARKERS: No results for input(s): AFPTM, CEA, CA199, CHROMGRNA in the last 8760 hours.  Assessment and Plan:  84 year old with thrombocytopenia.  Request for bone marrow biopsy.  Discussed CT-guided bone marrow biopsy with moderate sedation with patient and daughter.  The risks include but not limited to bleeding and infection.  Patient and family have a good understanding of the procedure and informed consent was obtained.  Thank you for this interesting consult.  I greatly enjoyed meeting Yuko Coventry Abello and look forward to participating in their care.  A copy of this report was sent to the requesting provider on this date.  Electronically Signed: Burman Riis, MD 10/31/2017, 7:55 AM   I spent a total of  15 Minutes   in face to face in clinical consultation, greater than 50% of which was counseling/coordinating care for a CT-guided bone marrow biopsy.

## 2017-10-31 NOTE — Procedures (Signed)
CT guided bone marrow biopsy.  2 aspirates and 1 core.  Minimal blood loss and no immediate complication. 

## 2017-11-09 ENCOUNTER — Ambulatory Visit
Admission: RE | Admit: 2017-11-09 | Discharge: 2017-11-09 | Disposition: A | Discharge status: Home / Self Care | Payer: Medicare Other | Source: Ambulatory Visit | Attending: Internal Medicine | Admitting: Internal Medicine

## 2017-11-09 DIAGNOSIS — Z1231 Encounter for screening mammogram for malignant neoplasm of breast: Secondary | ICD-10-CM | POA: Diagnosis present

## 2017-11-12 ENCOUNTER — Encounter (HOSPITAL_COMMUNITY): Payer: Self-pay | Admitting: Oncology

## 2017-11-12 LAB — CHROMOSOME ANALYSIS, BONE MARROW

## 2017-12-03 ENCOUNTER — Inpatient Hospital Stay: Payer: Medicare Other | Attending: Oncology | Admitting: Oncology

## 2017-12-03 ENCOUNTER — Inpatient Hospital Stay: Payer: Medicare Other

## 2017-12-03 ENCOUNTER — Encounter: Payer: Self-pay | Admitting: Oncology

## 2017-12-03 VITALS — BP 165/66 | HR 62 | Temp 98.3°F | Resp 18 | Wt 181.0 lb

## 2017-12-03 DIAGNOSIS — Z794 Long term (current) use of insulin: Secondary | ICD-10-CM | POA: Diagnosis not present

## 2017-12-03 DIAGNOSIS — Z79899 Other long term (current) drug therapy: Secondary | ICD-10-CM

## 2017-12-03 DIAGNOSIS — E669 Obesity, unspecified: Secondary | ICD-10-CM | POA: Insufficient documentation

## 2017-12-03 DIAGNOSIS — K219 Gastro-esophageal reflux disease without esophagitis: Secondary | ICD-10-CM | POA: Insufficient documentation

## 2017-12-03 DIAGNOSIS — E785 Hyperlipidemia, unspecified: Secondary | ICD-10-CM | POA: Diagnosis not present

## 2017-12-03 DIAGNOSIS — D469 Myelodysplastic syndrome, unspecified: Secondary | ICD-10-CM | POA: Insufficient documentation

## 2017-12-03 DIAGNOSIS — Z7982 Long term (current) use of aspirin: Secondary | ICD-10-CM | POA: Insufficient documentation

## 2017-12-03 DIAGNOSIS — I110 Hypertensive heart disease with heart failure: Secondary | ICD-10-CM | POA: Diagnosis not present

## 2017-12-03 DIAGNOSIS — Z87891 Personal history of nicotine dependence: Secondary | ICD-10-CM | POA: Insufficient documentation

## 2017-12-03 DIAGNOSIS — G2581 Restless legs syndrome: Secondary | ICD-10-CM | POA: Insufficient documentation

## 2017-12-03 DIAGNOSIS — I2510 Atherosclerotic heart disease of native coronary artery without angina pectoris: Secondary | ICD-10-CM | POA: Insufficient documentation

## 2017-12-03 DIAGNOSIS — D649 Anemia, unspecified: Secondary | ICD-10-CM | POA: Diagnosis not present

## 2017-12-03 DIAGNOSIS — D696 Thrombocytopenia, unspecified: Secondary | ICD-10-CM | POA: Diagnosis present

## 2017-12-03 DIAGNOSIS — E1121 Type 2 diabetes mellitus with diabetic nephropathy: Secondary | ICD-10-CM | POA: Insufficient documentation

## 2017-12-03 DIAGNOSIS — I509 Heart failure, unspecified: Secondary | ICD-10-CM | POA: Diagnosis not present

## 2017-12-03 LAB — CBC WITH DIFFERENTIAL/PLATELET
Basophils Absolute: 0.1 10*3/uL (ref 0–0.1)
Basophils Relative: 1 %
EOS ABS: 0.1 10*3/uL (ref 0–0.7)
EOS PCT: 2 %
HCT: 33.9 % — ABNORMAL LOW (ref 35.0–47.0)
Hemoglobin: 11.3 g/dL — ABNORMAL LOW (ref 12.0–16.0)
LYMPHS ABS: 2.4 10*3/uL (ref 1.0–3.6)
LYMPHS PCT: 38 %
MCH: 31.7 pg (ref 26.0–34.0)
MCHC: 33.2 g/dL (ref 32.0–36.0)
MCV: 95.4 fL (ref 80.0–100.0)
MONO ABS: 0.5 10*3/uL (ref 0.2–0.9)
Monocytes Relative: 8 %
Neutro Abs: 3.4 10*3/uL (ref 1.4–6.5)
Neutrophils Relative %: 51 %
PLATELETS: 79 10*3/uL — AB (ref 150–440)
RBC: 3.55 MIL/uL — ABNORMAL LOW (ref 3.80–5.20)
RDW: 14.2 % (ref 11.5–14.5)
WBC: 6.5 10*3/uL (ref 3.6–11.0)

## 2017-12-03 NOTE — Progress Notes (Signed)
Pt in for follow up, reports being "fatigued still".  Denies any other concerns.

## 2017-12-03 NOTE — Progress Notes (Signed)
Hematology/Oncology  Follow up note [facility] Telephone:(336) [phone] Fax:(336) [phone]   Patient Care Team: Tracie Harrier, MD as PCP - General (Internal Medicine) Alisa Graff, FNP as Nurse Practitioner (Family Medicine) Ubaldo Glassing Javier Docker, MD as Consulting Physician (Cardiology) Solum, Betsey Holiday, MD as Physician Assistant (Endocrinology)  REFERRING PROVIDER: [facility].  CHIEF COMPLAINTS/PURPOSE OF CONSULTATION:  Evaluation of thrombocytopenia  HISTORY OF PRESENTING ILLNESS:  Katherine Hernandez is a  84 y.o.  female with PMH listed below who was referred to me for evaluation of thrombocytopenia.  Patient was seen by [facility] for iron deficiency anemia/epigastric pain/GERD.  Patient had a workup for GI bleed with EGD/colonoscopy.   She has to taken iron supplements twice a day.  Was noted that on her CBC she has thrombocytopenia.  And referred to [place] for further evaluation.  Patient denies having any acute bleeding.  She has easy bruising. Reviewed patient's lab work at [facility].  Her hemoglobin is 11.3, platelet counts 72,000, normal differential.  Her thrombocytopenia can trace back to September 2015, at that time her counts was 95,000. Patient reports mild fatigue otherwise feeling well.  Good appetite no weight loss.  No fever chills, night sweats.  For her thrombocytopenia,US abdomen showed no hepatosplenomegaly.  Normal B12, folate, negative hepatitis, HIV titer  reports that her sister was found to be a carrier of "fanconi disease"    INTERVAL HISTORY [name] is a 84 y.o. female who has above history reviewed by me today presents for follow up visit for management of thrombocytopenia. She was accompanied by her daughter.   During the interval she has had bone marrow biopsy done and present to discuss results.  Continues to have easy bruising, denies any active bleeding events. She has shoulder pain which needs  steroids injections.   alsoReview of Systems  Constitutional: Positive for malaise/fatigue. Negative for chills, fever and weight loss.  HENT: Negative for congestion, ear discharge, ear pain, nosebleeds, sinus pain and sore throat.   Eyes: Negative for double vision, photophobia, pain, discharge and redness.  Respiratory: Negative for cough, hemoptysis, sputum production, shortness of breath and wheezing.   Cardiovascular: Negative for chest pain, palpitations, orthopnea, claudication and leg swelling.  Gastrointestinal: Positive for heartburn. Negative for abdominal pain, blood in stool, constipation, diarrhea, melena, nausea and vomiting.  Genitourinary: Negative for dysuria, flank pain, frequency and hematuria.  Musculoskeletal: Negative for back pain, myalgias and neck pain.  Skin: Negative for itching and rash.  Neurological: Negative for dizziness, tingling, tremors, focal weakness, weakness and headaches.  Endo/Heme/Allergies: Negative for environmental allergies. Bruises/bleeds easily.  Psychiatric/Behavioral: Negative for depression and hallucinations. The patient is not nervous/anxious.     MEDICAL HISTORY:  Past Medical History:  Diagnosis Date  . Arthritis   . CAD (coronary artery disease)    Last nuclear 02/2009 EF 72% and neg for ischemia   . CHF (congestive heart failure) ([place])   . Coronary artery disease    CAD with PTCA(cfx)fx -1993,and CABG ,2004  . Diabetes mellitus without complication ([place])   . Diabetic nephropathy ([place])   . Diabetic peripheral neuropathy ([place])   . GERD (gastroesophageal reflux disease)   . Hyperlipidemia   . Hypertension   . Obesity   . Retinopathy, diabetic, background ([place])   . RLS (restless legs syndrome)   . Thrombocytopenia ([place])   . Urinary incontinence     SURGICAL HISTORY: Past Surgical History:  Procedure Laterality Date  .  ABDOMINAL HYSTERECTOMY    . BREAST BIOPSY Left 1970   surgical exc  . BREAST BIOPSY Right 09/13/2016     path pending x 2 areas  . CHOLECYSTECTOMY  1968  . COLONOSCOPY WITH PROPOFOL N/A 01/02/2017   Procedure: COLONOSCOPY WITH PROPOFOL;  Surgeon: Lollie Sails, MD;  Location: [facility] ENDOSCOPY;  Service: Endoscopy;  Laterality: N/A;  . CORONARY ANGIOPLASTY    . CORONARY ARTERY BYPASS GRAFT  02/2003  . ery    . ESOPHAGOGASTRODUODENOSCOPY (EGD) WITH PROPOFOL N/A 01/02/2017   Procedure: ESOPHAGOGASTRODUODENOSCOPY (EGD) WITH PROPOFOL;  Surgeon: Lollie Sails, MD;  Location: [facility] ENDOSCOPY;  Service: Endoscopy;  Laterality: N/A;    SOCIAL HISTORY: Social History   Socioeconomic History  . Marital status: Widowed    Spouse name: Not on file  . Number of children: 3  . Years of education: 8  . Highest education level: 8th grade  Occupational History  . Not on file  Social Needs  . Financial resource strain: Not hard at all  . Food insecurity:    Worry: Never true    Inability: Never true  . Transportation needs:    Medical: No    Non-medical: No  Tobacco Use  . Smoking status: Former Smoker    Packs/day: 1.00    Years: 15.00    Pack years: 15.00    Types: Cigarettes    Last attempt to quit: 07/17/1978    Years since quitting: 39.4  . Smokeless tobacco: Never Used  . Tobacco comment: quit in year 1980  Substance and Sexual Activity  . Alcohol use: No  . Drug use: No  . Sexual activity: Never  Lifestyle  . Physical activity:    Days per week: 0 days    Minutes per session: 0 min  . Stress: To some extent  Relationships  . Social connections:    Talks on phone: Three times a week    Gets together: More than three times a week    Attends religious service: More than 4 times per year    Active member of club or organization: No    Attends meetings of clubs or organizations: Never    Relationship status: Widowed  . Intimate partner violence:    Fear of current or ex partner: Patient refused    Emotionally abused: Patient refused    Physically abused: Patient refused     Forced sexual activity: Patient refused  Other Topics Concern  . Not on file  Social History Narrative   History of smoking cigarettes: Former smoker   Quit in year : 1980   Pack-year Hx: 40   No alcohol.   Caffeine :yes    Diet : no   Exercise :no   Occupation : unemployed, retired    No Psychologist, educational status: single,widowed   LV systolic Dysfunction   EF less than 40 >no    FAMILY HISTORY: Family History  Problem Relation Age of Onset  . Breast cancer Sister 18  . Breast cancer Sister   . Diabetes Mother   . Heart disease Mother   . Kidney disease Father   . Hypertension Father   . Fanconi anemia Sister     ALLERGIES:  is allergic to amoxicillin; cefdinir; doxycycline; eliquis [apixaban]; erythromycin; lisinopril; macrodantin [nitrofurantoin]; mobic [meloxicam]; statins; and sulfa antibiotics.  MEDICATIONS:  Current Outpatient Medications  Medication Sig Dispense Refill  . albuterol (PROVENTIL HFA;VENTOLIN HFA) 108 (90 Base) MCG/ACT inhaler Inhale 2 puffs every 6 (six)  hours as needed into the lungs for wheezing or shortness of breath.    . allopurinol (ZYLOPRIM) 100 MG tablet Take 1 tablet daily by mouth.    Marland Kitchen aspirin 81 MG tablet Take 81 mg daily by mouth.     . budesonide-formoterol (SYMBICORT) 160-4.5 MCG/ACT inhaler Inhale 2 puffs into the lungs 2 (two) times daily.    . cetirizine (ZYRTEC) 10 MG tablet Take 10 mg by mouth daily.    . Cyanocobalamin (B-12 COMPLIANCE INJECTION [place]) Inject as directed every 30 (thirty) days.    . ergocalciferol (VITAMIN D2) 50000 units capsule Take 50,000 Units by mouth once a week.    . estradiol (ESTRACE) 0.1 MG/GM vaginal cream Place 1 Applicatorful vaginally 2 (two) times a week.    . ferrous sulfate 325 (65 FE) MG tablet Take 325 mg by mouth daily with breakfast.    . fluticasone (FLONASE) 50 MCG/ACT nasal spray Place 2 sprays into both nostrils at bedtime. 16 g 2  . furosemide (LASIX) 20 MG tablet Take 60 mg by mouth  daily.     Marland Kitchen gabapentin (NEURONTIN) 300 MG capsule Take 300 mg 3 (three) times daily by mouth.     . Insulin Glargine (LANTUS SOLOSTAR) 100 UNIT/ML Solostar Pen Inject 20 Units into the skin at bedtime. 70 units in the Am, 60 units at PM    . insulin lispro (HUMALOG) 100 UNIT/ML injection Inject 12 Units into the skin 3 (three) times daily before meals. 25 units breafast/lunch 30 units dinner     . Liraglutide (VICTOZA [place]) Inject 1.2 mg/mL at bedtime into the skin.     Marland Kitchen LORazepam (ATIVAN) 0.5 MG tablet Take 0.5 mg by mouth every 8 (eight) hours.    Marland Kitchen losartan (COZAAR) 100 MG tablet Take 1 tablet daily by mouth.    . metoprolol tartrate (LOPRESSOR) 50 MG tablet Take 1 tablet (50 mg total) daily by mouth. 30 tablet 0  . nitroGLYCERIN (NITROSTAT) 0.4 MG SL tablet Place 0.4 mg under the tongue every 5 (five) minutes as needed for chest pain.    . pantoprazole (PROTONIX) 40 MG tablet Take 40 mg by mouth daily.     Marland Kitchen rOPINIRole (REQUIP) 0.5 MG tablet Take 0.5 mg by mouth at bedtime.    . sucralfate (CARAFATE) 1 g tablet Take 1 g by mouth daily.      No current facility-administered medications for this visit.      PHYSICAL EXAMINATION: ECOG PERFORMANCE STATUS: 1 - Symptomatic but completely ambulatory Vitals:   12/03/17 1357  BP: (!) 165/66  Pulse: 62  Resp: 18  Temp: 98.3 F (36.8 C)   Filed Weights   12/03/17 1357  Weight: 181 lb (82.1 kg)    Physical Exam  Constitutional: She is oriented to person, place, and time and well-developed, well-nourished, and in no distress. No distress.  HENT:  Head: Normocephalic and atraumatic.  Mouth/Throat: Oropharynx is clear and moist. No oropharyngeal exudate.  Eyes: Pupils are equal, round, and reactive to light. Conjunctivae and EOM are normal. No scleral icterus.  Neck: Normal range of motion. Neck supple.  Cardiovascular: Normal rate and regular rhythm.  No murmur heard. Pulmonary/Chest: Effort normal. No respiratory distress. She has no  wheezes.  Abdominal: She exhibits no distension. There is no tenderness.  Musculoskeletal: Normal range of motion. She exhibits no edema.  Lymphadenopathy:    She has no cervical adenopathy.  Neurological: She is alert and oriented to person, place, and time. Coordination normal.  Skin:  Skin is dry. No erythema.  Small bruises.   Psychiatric: Affect and judgment normal.     LABORATORY DATA:  I have reviewed the data as listed Lab Results  Component Value Date   WBC 6.2 10/31/2017   HGB 12.5 10/31/2017   HCT 37.6 10/31/2017   MCV 97.4 10/31/2017   PLT 81 (L) 10/31/2017   Recent Labs    05/26/17 0443 05/27/17 0439 09/26/17 1057  NA 140 136 138  K 3.6 3.8 3.4*  CL 105 99* 103  CO2 '26 26 25  ' GLUCOSE 113* 182* 176*  BUN 32* 42* 32*  CREATININE 1.36* 1.54* 1.46*  CALCIUM 9.2 9.1 9.6  GFRNONAA 34* 30* 32*  GFRAA 40* 34* 37*  PROT  --   --  6.9  ALBUMIN  --   --  3.8  AST  --   --  32  ALT  --   --  17  ALKPHOS  --   --  89  BILITOT  --   --  0.5    Bone marrow biopsy results:  Bone Marrow, Aspirate,Biopsy, and Clot, right ilium Mildly hypercellular marrow with mild erythroid and megakaryocytic dysplastic.  See comments Peripheral blood Thrombocytopenia Diagnosis Note The marrow is mildly hypercellular. There is mild dysplasia in the erythroid and megakaryocytic lineages. While the findings are suggestive of a low grade myelodysplastic syndrome, reactive causes of dysplasia (nutritional deficiencies, medication, etc.) must be excluded before a diagnosis is established. Correlation with FISH is recommended Cytogenetics normal.  MDS FISH panel for chromosome 5, 7, 8 negative.  ASSESSMENT & PLAN:  1. Thrombocytopenia ([place])   2. Normocytic anemia   3. MDS (myelodysplastic syndrome) ([place])    #Bone marrow biopsy results discussed with patient and daughter. Bone marrow biopsy suggests a low grade MDS. Discussed about MDS disease, prognosis and potential treatment  options.  Will need to talk to [facility] Pathology for clarification of MDS type. Call her office and left my cell phone. Awaiting call back.  If MDS is confirmed. cytogenetics normal. MDS IPSS R score 1.5, very low risk.  Continue monitor, if thrombocytopenia continues to progress, may consider about Vid[place] treatment.  # Advise avoiding NSAIDs. Her current count has been stable. No need for transfusion. S  All questions were answered. The patient knows to call the clinic with any problems questions or concerns.  Return of visit: 3 months.  Earlie Server, MD, PhD Hematology Oncology [facility] Pager- [phone] 12/03/2017

## 2017-12-07 ENCOUNTER — Other Ambulatory Visit: Payer: Self-pay | Admitting: Physical Medicine and Rehabilitation

## 2017-12-07 ENCOUNTER — Telehealth: Payer: Self-pay | Admitting: Oncology

## 2017-12-07 DIAGNOSIS — M5416 Radiculopathy, lumbar region: Secondary | ICD-10-CM

## 2017-12-07 NOTE — Telephone Encounter (Signed)
I have discussed with pathologist and pathology reports have been changed to MDS- MLD. Normal cytogenetics. MDS IPSS score 1.5, very low risk.  Continue monitor counts. No need for treatment for now.  If she needs invasive procedure, she can be transfused with platelet to reach threshold needed for procedures Communicated above with patient's daughter over the phone.

## 2017-12-20 ENCOUNTER — Other Ambulatory Visit: Payer: Self-pay | Admitting: Internal Medicine

## 2017-12-21 ENCOUNTER — Ambulatory Visit
Admission: RE | Admit: 2017-12-21 | Discharge: 2017-12-21 | Disposition: A | Discharge status: Home / Self Care | Payer: Medicare Other | Source: Ambulatory Visit | Attending: Physical Medicine and Rehabilitation | Admitting: Physical Medicine and Rehabilitation

## 2017-12-21 DIAGNOSIS — M5116 Intervertebral disc disorders with radiculopathy, lumbar region: Secondary | ICD-10-CM | POA: Insufficient documentation

## 2017-12-21 DIAGNOSIS — M48061 Spinal stenosis, lumbar region without neurogenic claudication: Secondary | ICD-10-CM | POA: Diagnosis not present

## 2017-12-21 DIAGNOSIS — R609 Edema, unspecified: Secondary | ICD-10-CM | POA: Insufficient documentation

## 2017-12-21 DIAGNOSIS — M5416 Radiculopathy, lumbar region: Secondary | ICD-10-CM | POA: Diagnosis present

## 2018-01-02 ENCOUNTER — Telehealth: Payer: Self-pay | Admitting: *Deleted

## 2018-01-02 NOTE — Telephone Encounter (Signed)
Patient has chronic pain and is being seen at [facility] by Allene Dillon, NP with Dr Sharlet Salina. THe plan is to do back injections, but her platelet count was 75 and they are going to recheck labs on 6/25 and plan to inject on the 27th. They want [place] to do a platelet transfusion on 6/25 if her platelet are still low. I explained to her that I would call [facility] to have them contact Dr Tasia Catchings regarding this and that she should not be the one trying to arrange this. I did call [facility] and explained to the receptionist who stated that Loree Fee is new and does not know the protocol. She took Dr Collie Siad number for her to contact her and will call her to arrange for want the patient needs to have done.

## 2018-01-07 ENCOUNTER — Encounter: Payer: Self-pay | Admitting: Family

## 2018-01-07 ENCOUNTER — Ambulatory Visit: Payer: Medicare Other | Attending: Family | Admitting: Family

## 2018-01-07 VITALS — BP 122/60 | HR 58 | Resp 18 | Ht 62.0 in | Wt 177.4 lb

## 2018-01-07 DIAGNOSIS — Z794 Long term (current) use of insulin: Secondary | ICD-10-CM | POA: Insufficient documentation

## 2018-01-07 DIAGNOSIS — E1142 Type 2 diabetes mellitus with diabetic polyneuropathy: Secondary | ICD-10-CM | POA: Insufficient documentation

## 2018-01-07 DIAGNOSIS — Z955 Presence of coronary angioplasty implant and graft: Secondary | ICD-10-CM | POA: Insufficient documentation

## 2018-01-07 DIAGNOSIS — G8929 Other chronic pain: Secondary | ICD-10-CM | POA: Diagnosis not present

## 2018-01-07 DIAGNOSIS — I890 Lymphedema, not elsewhere classified: Secondary | ICD-10-CM | POA: Diagnosis not present

## 2018-01-07 DIAGNOSIS — E11319 Type 2 diabetes mellitus with unspecified diabetic retinopathy without macular edema: Secondary | ICD-10-CM | POA: Diagnosis not present

## 2018-01-07 DIAGNOSIS — Z87891 Personal history of nicotine dependence: Secondary | ICD-10-CM | POA: Diagnosis not present

## 2018-01-07 DIAGNOSIS — I2510 Atherosclerotic heart disease of native coronary artery without angina pectoris: Secondary | ICD-10-CM | POA: Diagnosis not present

## 2018-01-07 DIAGNOSIS — G2581 Restless legs syndrome: Secondary | ICD-10-CM | POA: Insufficient documentation

## 2018-01-07 DIAGNOSIS — I110 Hypertensive heart disease with heart failure: Secondary | ICD-10-CM | POA: Insufficient documentation

## 2018-01-07 DIAGNOSIS — E1121 Type 2 diabetes mellitus with diabetic nephropathy: Secondary | ICD-10-CM | POA: Diagnosis not present

## 2018-01-07 DIAGNOSIS — Z7951 Long term (current) use of inhaled steroids: Secondary | ICD-10-CM | POA: Insufficient documentation

## 2018-01-07 DIAGNOSIS — N183 Chronic kidney disease, stage 3 unspecified: Secondary | ICD-10-CM

## 2018-01-07 DIAGNOSIS — Z951 Presence of aortocoronary bypass graft: Secondary | ICD-10-CM | POA: Diagnosis not present

## 2018-01-07 DIAGNOSIS — M1990 Unspecified osteoarthritis, unspecified site: Secondary | ICD-10-CM | POA: Diagnosis not present

## 2018-01-07 DIAGNOSIS — I5032 Chronic diastolic (congestive) heart failure: Secondary | ICD-10-CM | POA: Diagnosis not present

## 2018-01-07 DIAGNOSIS — E785 Hyperlipidemia, unspecified: Secondary | ICD-10-CM | POA: Insufficient documentation

## 2018-01-07 DIAGNOSIS — Z79899 Other long term (current) drug therapy: Secondary | ICD-10-CM | POA: Diagnosis not present

## 2018-01-07 DIAGNOSIS — E669 Obesity, unspecified: Secondary | ICD-10-CM | POA: Diagnosis not present

## 2018-01-07 DIAGNOSIS — K219 Gastro-esophageal reflux disease without esophagitis: Secondary | ICD-10-CM | POA: Insufficient documentation

## 2018-01-07 DIAGNOSIS — E1122 Type 2 diabetes mellitus with diabetic chronic kidney disease: Secondary | ICD-10-CM

## 2018-01-07 DIAGNOSIS — I10 Essential (primary) hypertension: Secondary | ICD-10-CM

## 2018-01-07 NOTE — Patient Instructions (Signed)
Continue weighing daily and call for an overnight weight gain of > 2 pounds or a weekly weight gain of >5 pounds. 

## 2018-01-07 NOTE — Progress Notes (Signed)
 Patient ID: Katherine Hernandez, female    DOB: 06/26/1930, 84 y.o.   MRN: [id number]  HPI  Katherine Hernandez is an 84 y/o female with a history of RLS, HTN, hyperlipidemia, GERD, diabetes, CAD, arthritis and chronic heart failure.   Echo report from 05/26/17 reviewed and showed an EF of 55-65% along with mild MR.   Has not been admitted or been in the ED in the last 6 months.   She presents today for a follow-up visit with a chief complaint of moderate fatigue upon minimal exertion. She describes this as chronic in nature having been present for several years. She has associated light-headedness, pedal edema, palpitations, chronic back pain and easy bruising along with this. She denies any difficulty sleeping, abdominal distention, chest pain, shortness of breath, cough or weight gain.   Past Medical History:  Diagnosis Date  . Arthritis   . CAD (coronary artery disease)    Last nuclear 02/2009 EF 72% and neg for ischemia   . CHF (congestive heart failure) (HCC)   . Coronary artery disease    CAD with PTCA(cfx)fx -1993,and CABG ,2004  . Diabetes mellitus without complication (HCC)   . Diabetic nephropathy (HCC)   . Diabetic peripheral neuropathy (HCC)   . GERD (gastroesophageal reflux disease)   . Hyperlipidemia   . Hypertension   . Obesity   . Retinopathy, diabetic, background (HCC)   . RLS (restless legs syndrome)   . Thrombocytopenia (HCC)   . Urinary incontinence    Past Surgical History:  Procedure Laterality Date  . ABDOMINAL HYSTERECTOMY    . BONE MARROW BIOPSY    . BREAST BIOPSY Left 1970   surgical exc  . BREAST BIOPSY Right 09/13/2016   path pending x 2 areas  . CHOLECYSTECTOMY  1968  . COLONOSCOPY WITH PROPOFOL N/A 01/02/2017   Procedure: COLONOSCOPY WITH PROPOFOL;  Surgeon: Skulskie, Martin U, MD;  Location: [facility] ENDOSCOPY;  Service: Endoscopy;  Laterality: N/A;  . CORONARY ANGIOPLASTY    . CORONARY ARTERY BYPASS GRAFT  02/2003  . ery    . ESOPHAGOGASTRODUODENOSCOPY (EGD)  WITH PROPOFOL N/A 01/02/2017   Procedure: ESOPHAGOGASTRODUODENOSCOPY (EGD) WITH PROPOFOL;  Surgeon: Skulskie, Martin U, MD;  Location: [facility] ENDOSCOPY;  Service: Endoscopy;  Laterality: N/A;   Family History  Problem Relation Age of Onset  . Breast cancer Sister 50  . Breast cancer Sister   . Diabetes Mother   . Heart disease Mother   . Kidney disease Father   . Hypertension Father   . Fanconi anemia Sister    Social History   Tobacco Use  . Smoking status: Former Smoker    Packs/day: 1.00    Years: 15.00    Pack years: 15.00    Types: Cigarettes    Last attempt to quit: 07/17/1978    Years since quitting: 39.5  . Smokeless tobacco: Never Used  . Tobacco comment: quit in year 1980  Substance Use Topics  . Alcohol use: No   Allergies  Allergen Reactions  . Amoxicillin Diarrhea  . Cefdinir   . Doxycycline Diarrhea  . Eliquis [Apixaban] Other (See Comments)    Dizziness   . Erythromycin Diarrhea  . Lisinopril Cough  . Macrodantin [Nitrofurantoin] Diarrhea  . Mobic [Meloxicam]     diarrhea  . Statins Other (See Comments)    Joint pain  . Sulfa Antibiotics    Prior to Admission medications   Medication Sig Start Date End Date Taking? Authorizing Provider  albuterol (PROVENTIL HFA;VENTOLIN HFA)   108 (90 Base) MCG/ACT inhaler Inhale 2 puffs every 6 (six) hours as needed into the lungs for wheezing or shortness of breath.   Yes [provider]  allopurinol (ZYLOPRIM) 100 MG tablet TAKE 1 TABLET BY MOUTH ONCE DAILY 08/06/17  Yes [provider]  cetirizine (ZYRTEC) 10 MG tablet Take 10 mg by mouth daily.   Yes [provider]  Cyanocobalamin (B-12 COMPLIANCE INJECTION IJ) Inject as directed every 30 (thirty) days.   Yes [provider]  ergocalciferol (VITAMIN D2) 50000 units capsule Take 50,000 Units by mouth once a week.   Yes [provider]  estradiol (ESTRACE) 0.1 MG/GM vaginal cream Place 1 Applicatorful vaginally 2 (two) times a  week. Pt takes 2-3 times a week   Yes [provider]  fluticasone (FLONASE) 50 MCG/ACT nasal spray PLACE 2 SPRAYS INTO BOTH NOSTRILS AT BEDTIME 12/20/17  Yes Laverle Hobby, MD  furosemide (LASIX) 20 MG tablet Take 80 mg by mouth daily.    Yes [provider]  gabapentin (NEURONTIN) 300 MG capsule Take 300 mg by mouth 3 (three) times daily. Pt doesn't always take 3 times a day   Yes [provider]  Insulin Glargine (LANTUS SOLOSTAR) 100 UNIT/ML Solostar Pen Inject 20 Units into the skin at bedtime.    Yes [provider]  insulin lispro (HUMALOG) 100 UNIT/ML injection Inject 20 Units into the skin 3 (three) times daily before meals.    Yes [provider]  iron polysaccharides (NU-IRON) 150 MG capsule Take 150 mg by mouth daily.  10/23/17 10/23/18 Yes [provider]  losartan (COZAAR) 100 MG tablet Take 100 mg by mouth daily.   Yes [provider]  metoprolol tartrate (LOPRESSOR) 50 MG tablet Take 1 tablet (50 mg total) daily by mouth. 05/27/17  Yes Demetrios Loll, MD  nitroGLYCERIN (NITROSTAT) 0.4 MG SL tablet Place 0.4 mg under the tongue every 5 (five) minutes as needed for chest pain.   Yes [provider]  pantoprazole (PROTONIX) 40 MG tablet Take 40 mg by mouth daily.    Yes [provider]  rOPINIRole (REQUIP) 0.5 MG tablet Take 0.5 mg by mouth at bedtime.   Yes [provider]  sertraline (ZOLOFT) 50 MG tablet Take 50 mg by mouth daily.  10/23/17 10/23/18 Yes [provider]  allopurinol (ZYLOPRIM) 100 MG tablet Take 1 tablet daily by mouth. 03/28/17 10/31/17  [provider]  Fluocinolone Acetonide Scalp 0.01 % OIL  11/13/17   [provider]  LORazepam (ATIVAN) 0.5 MG tablet Take 0.5 mg by mouth every 8 (eight) hours.    [provider]  sucralfate (CARAFATE) 1 g tablet Take 1 g by mouth daily.     [provider]    Review of Systems  Constitutional: Positive  for fatigue. Negative for appetite change.  HENT: Positive for rhinorrhea. Negative for congestion and sore throat.   Eyes: Negative.   Respiratory: Negative for cough, chest tightness and shortness of breath.   Cardiovascular: Positive for palpitations (on occasion) and leg swelling. Negative for chest pain.  Gastrointestinal: Negative for abdominal distention.  Endocrine: Negative.   Genitourinary: Negative.   Musculoskeletal: Positive for back pain. Negative for neck pain.  Skin: Negative.   Allergic/Immunologic: Negative.   Neurological: Positive for light-headedness. Negative for dizziness and headaches.       "jerking sensation in hands at times"    Hematological: Negative for adenopathy. Bruises/bleeds easily.  Psychiatric/Behavioral: Negative for dysphoric mood and sleep  disturbance (wearing bipap nightly). The patient is not nervous/anxious.    Vitals:   01/07/18 0952  BP: 122/60  Pulse: (!) 58  Resp: 18  SpO2: 98%  Weight: 177 lb 6 oz (80.5 kg)  Height: 5' 2" (1.575 m)   Wt Readings from Last 3 Encounters:  01/07/18 177 lb 6 oz (80.5 kg)  12/21/17 181 lb (82.1 kg)  12/03/17 181 lb (82.1 kg)   Lab Results  Component Value Date   CREATININE 1.46 (H) 09/26/2017   CREATININE 1.54 (H) 05/27/2017   CREATININE 1.36 (H) 05/26/2017    Physical Exam  Constitutional: She is oriented to person, place, and time. She appears well-developed and well-nourished.  HENT:  Head: Normocephalic and atraumatic.  Neck: Normal range of motion. Neck supple. No JVD present.  Cardiovascular: Normal rate. An irregular rhythm present.  Pulmonary/Chest: Effort normal. She has no wheezes. She has no rales.  Abdominal: Soft. She exhibits no distension. There is no tenderness.  Musculoskeletal: She exhibits edema (2+ pitting edema in bilateral lower legs). She exhibits no tenderness.  Neurological: She is alert and oriented to person, place, and time.  Skin: Skin is warm and dry.   Psychiatric: Her behavior is normal. Thought content normal. Her mood appears not anxious.  Nursing note and vitals reviewed.  Assessment & Plan:  1: Chronic heart failure with preserved ejection fraction- - NYHA class III - mildly fluid overloaded today - weighing daily and says that her weight has continued to decline. Reminded to call for an overnight weight gain of >2 pounds or a weekly weight gain of >5 pounds - weight down 11 pounds since she was last here 6 months ago - not adding salt to her food and has been reading food labels for sodium content. Reminded to closely follow a 2069m sodium diet  - she is unsure of fluid intake. Emphasized the importance of keeping daily fluid intake to ~ 60 ounces daily.  - saw cardiologist (Ubaldo Glassing 10/23/17 - has been wearing bipap nightly and says that she's been sleeping better - PharmD reconciled medications with the patient  2: HTN- - BP looks good today - saw PCP (Hande) 10/23/17 - BMP from 11/01/17 reviewed and showed sodium 143, potassium 4.1 and GFR 33  3: Diabetes-  - nonfasting glucose at home this morning was 166 - saw endocrinologist (Solum) 06/13/17 & returns to her tomorrow - A1c on 05/26/17 was 7.7%  4: Lymphedema- - stage 2 - has been elevating her legs more often but edema persists - had been wearing TED hose daily but edema was still present - limited in exercise due to fatigue - will make a referral for lymphapress compression boots  Medication bottles were reviewed.  Return in 3 months or sooner for any questions/problems before then.

## 2018-01-08 ENCOUNTER — Encounter: Payer: Self-pay | Admitting: Family

## 2018-01-13 ENCOUNTER — Encounter: Payer: Self-pay | Admitting: Internal Medicine

## 2018-01-14 NOTE — Progress Notes (Signed)
[facility] Consultation      Assessment and Plan:  Obstructive sleep apnea --Her CPAP was taken away due to noncompliance, is now started on bipap.  -In lab sleep study 06/14/17; AHI equals 7.4, PLMI equals 27 not associated with arousals. - Has been referred to mask fitting clinic, is now tolerating Pap better.  Encouraged to continue to use every night for the entire night.  Congestive heart failure -Noted congestive heart failure with recent hospital admission with CHF exacerbation, and atrial fibrillation. -Continue to follow with cardiology.  Chronic bronchitis with dyspnea on exertion. -PFT does not show evidence of COPD/emphysema. -Use albuterol as needed.  Allergic rhinitis. - Will refill Flonase.  Hoarseness of voice. -Resolved after stopping Symbicort.  Meds ordered this encounter  Medications  . fluticasone (FLONASE) 50 MCG/ACT nasal spray    Sig: Place 2 sprays into both nostrils daily.    Dispense:  48 g    Refill:  3  . albuterol (PROVENTIL HFA;VENTOLIN HFA) 108 (90 Base) MCG/ACT inhaler    Sig: Inhale 2 puffs into the lungs every 6 (six) hours as needed for wheezing or shortness of breath.    Dispense:  3 Inhaler    Refill:  3     Return in about 6 months (around 07/18/2018).    Date: 01/14/2018  MRN# [id number] Dossie Ocanas Gombos [birth year]    Martina Brodbeck Geerdes is a 84 y.o. old female seen in consultation for chief complaint of:    Chief Complaint  Patient presents with  . Sleep Apnea    Pt here for bi-pap compliance visit. Pt states she is doing well on bipap.    HPI:   Patient has a history of obstructive sleep apnea, she was previously on CPAP, this was taken away.  At last visit she had been started on BiPAP, was tolerating the pressure better, she feels that she is tolerating it well, she sometimes takes it off in the middle of the night without realizing it.   She feels that her breathing is doing well. At last visit she was  taken off symbicort due to hoarsenss (better) and asked to use albuterol as needed. She was using it a few times per week and felt that it helped her breathing. Her breathing was made worse by pollen, and also when she is rushing around.   She is using flonase, 2 sprays in each nostril once daily, and feels that it is helped a lot.   **Review of download data 12/15/2017-12/17/2017>> data personally reviewed, uses greater than 4 hours is 19/30 days.  Average usage on days used is 4 hours 55 minutes.  Max IPAP 11, minimum EPAP 6, pressure support 5.  Residual AHI is 3.7.  Overall this shows an adequate compliance with Pap, very good control of obstructive sleep apnea. **PFT 09/04/17; FVC is 96% predicted, FEV1 is 99% predicted, ratio 77%.  There is no significant improvement with bronchodilator.  Flow volume loop is within normal limits. -Lung volumes show TLC of 87%, RV/TLC ratio is normal.  DLCO is mildly reduced at 65%. - Overall pulmonary function test showed normal pulmonary functions without evidence of COPD/emphysema. **Review of outside sleep study 04/17/17; AHI equals 24.  Auto CPAP patient was then started on CPAP, on review of download data usage greater than 4 hours was 3 out of 30 days.  Average usage on days used was 3 hours 55 minutes.  Minimum pressure was 5, max is 20.  Median pressure was  7, 95th percentile was 11, maximum was 12.  Residual AHI was 4.9. **Images personally reviewed, 05/25/17; cardiomegaly, interstitial edema consistent with congestive heart failure.  Social Hx:   Social History   Tobacco Use  . Smoking status: Former Smoker    Packs/day: 1.00    Years: 15.00    Pack years: 15.00    Types: Cigarettes    Last attempt to quit: 07/17/1978    Years since quitting: 39.5  . Smokeless tobacco: Never Used  . Tobacco comment: quit in year 1980  Substance Use Topics  . Alcohol use: No  . Drug use: No   Medication:    Current Outpatient Medications:  .  albuterol (PROVENTIL  HFA;VENTOLIN HFA) 108 (90 Base) MCG/ACT inhaler, Inhale 2 puffs every 6 (six) hours as needed into the lungs for wheezing or shortness of breath., Disp: , Rfl:  .  allopurinol (ZYLOPRIM) 100 MG tablet, Take 1 tablet daily by mouth., Disp: , Rfl:  .  allopurinol (ZYLOPRIM) 100 MG tablet, TAKE 1 TABLET BY MOUTH ONCE DAILY, Disp: , Rfl:  .  cetirizine (ZYRTEC) 10 MG tablet, Take 10 mg by mouth daily., Disp: , Rfl:  .  Cyanocobalamin (B-12 COMPLIANCE INJECTION IJ), Inject as directed every 30 (thirty) days., Disp: , Rfl:  .  ergocalciferol (VITAMIN D2) 50000 units capsule, Take 50,000 Units by mouth once a week., Disp: , Rfl:  .  estradiol (ESTRACE) 0.1 MG/GM vaginal cream, Place 1 Applicatorful vaginally 2 (two) times a week. Pt takes 2-3 times a week, Disp: , Rfl:  .  Fluocinolone Acetonide Scalp 0.01 % OIL, , Disp: , Rfl: 2 .  fluticasone (FLONASE) 50 MCG/ACT nasal spray, PLACE 2 SPRAYS INTO BOTH NOSTRILS AT BEDTIME, Disp: 16 g, Rfl: 2 .  furosemide (LASIX) 20 MG tablet, Take 80 mg by mouth daily. , Disp: , Rfl:  .  gabapentin (NEURONTIN) 300 MG capsule, Take 300 mg by mouth 3 (three) times daily. Pt doesn't always take 3 times a day, Disp: , Rfl:  .  Insulin Glargine (LANTUS SOLOSTAR) 100 UNIT/ML Solostar Pen, Inject 20 Units into the skin at bedtime. , Disp: , Rfl:  .  insulin lispro (HUMALOG) 100 UNIT/ML injection, Inject 20 Units into the skin 3 (three) times daily before meals. , Disp: , Rfl:  .  iron polysaccharides (NU-IRON) 150 MG capsule, Take 150 mg by mouth daily. , Disp: , Rfl:  .  LORazepam (ATIVAN) 0.5 MG tablet, Take 0.5 mg by mouth every 8 (eight) hours., Disp: , Rfl:  .  losartan (COZAAR) 100 MG tablet, Take 100 mg by mouth daily., Disp: , Rfl:  .  metoprolol tartrate (LOPRESSOR) 50 MG tablet, Take 1 tablet (50 mg total) daily by mouth., Disp: 30 tablet, Rfl: 0 .  nitroGLYCERIN (NITROSTAT) 0.4 MG SL tablet, Place 0.4 mg under the tongue every 5 (five) minutes as needed for chest  pain., Disp: , Rfl:  .  pantoprazole (PROTONIX) 40 MG tablet, Take 40 mg by mouth daily. , Disp: , Rfl:  .  rOPINIRole (REQUIP) 0.5 MG tablet, Take 0.5 mg by mouth at bedtime., Disp: , Rfl:  .  sertraline (ZOLOFT) 50 MG tablet, Take 50 mg by mouth daily. , Disp: , Rfl:  .  sucralfate (CARAFATE) 1 g tablet, Take 1 g by mouth daily. , Disp: , Rfl:    Allergies:  Amoxicillin; Cefdinir; Doxycycline; Eliquis [apixaban]; Erythromycin; Lisinopril; Macrodantin [nitrofurantoin]; Mobic [meloxicam]; Statins; and Sulfa antibiotics  Review of Systems: Gen:  Denies  fever, sweats, chills HEENT: Denies blurred vision, double vision. bleeds, sore throat Cvc:  No dizziness, chest pain. Resp:   Denies cough or sputum production, shortness of breath Gi: Denies swallowing difficulty, stomach pain. Gu:  Denies bladder incontinence, burning urine Ext:   No Joint pain, stiffness. Skin: No skin rash,  hives  Endoc:  No polyuria, polydipsia. Psych: No depression, insomnia. Other:  All other systems were reviewed with the patient and were negative other that what is mentioned in the HPI.   Physical Examination:   VS: There were no vitals taken for this visit.  General Appearance: No distress  Neuro:without focal findings,  speech normal,  HEENT: PERRLA, EOM intact.   Pulmonary: normal breath sounds, No wheezing.  CardiovascularNormal S1,S2.  No m/r/g.   Abdomen: Benign, Soft, non-tender. Renal:  No costovertebral tenderness  GU:  No performed at this time. Endoc: No evident thyromegaly, no signs of acromegaly. Skin:   warm, no rashes, no ecchymosis  Extremities: normal, no cyanosis, clubbing.  Other findings:    LABORATORY PANEL:   CBC No results for input(s): WBC, HGB, HCT, PLT in the last 168 hours. ------------------------------------------------------------------------------------------------------------------  Chemistries  No results for input(s): NA, K, CL, CO2, GLUCOSE, BUN, CREATININE,  CALCIUM, MG, AST, ALT, ALKPHOS, BILITOT in the last 168 hours.  Invalid input(s): GFRCGP ------------------------------------------------------------------------------------------------------------------  Cardiac Enzymes No results for input(s): TROPONINI in the last 168 hours. ------------------------------------------------------------  RADIOLOGY:  No results found.     Thank  you for the consultation and for allowing [facility] to assist in the care of your patient. Our recommendations are noted above.  Please contact us if we can be of further service.   Marda Stalker, M.D., F.C.C.P.  Board Certified in Internal Medicine, Pulmonary Medicine, [place], and Sleep Medicine.  [facility] Office Number: [phone]   01/14/2018

## 2018-01-15 ENCOUNTER — Ambulatory Visit (INDEPENDENT_AMBULATORY_CARE_PROVIDER_SITE_OTHER): Payer: Medicare Other | Admitting: Internal Medicine

## 2018-01-15 ENCOUNTER — Encounter: Payer: Self-pay | Admitting: Internal Medicine

## 2018-01-15 VITALS — BP 126/60 | HR 59 | Resp 16 | Ht 62.0 in | Wt 177.0 lb

## 2018-01-15 DIAGNOSIS — J410 Simple chronic bronchitis: Secondary | ICD-10-CM

## 2018-01-15 DIAGNOSIS — G4733 Obstructive sleep apnea (adult) (pediatric): Secondary | ICD-10-CM | POA: Diagnosis not present

## 2018-01-15 MED ORDER — FLUTICASONE PROPIONATE 50 MCG/ACT NA SUSP: 2.0000 | Freq: Every day | NASAL | 3 refills | Status: DC

## 2018-01-15 MED ORDER — ALBUTEROL SULFATE HFA 108 (90 BASE) MCG/ACT IN AERS: 2.0000 | INHALATION_SPRAY | Freq: Four times a day (QID) | RESPIRATORY_TRACT | 3 refills | Status: DC | PRN

## 2018-01-15 NOTE — Patient Instructions (Signed)
Continue to use albuterol inhaler as needed. Use Flonase daily. Continue to use Pap every night for the entire night.

## 2018-01-30 ENCOUNTER — Encounter: Payer: Self-pay | Admitting: Oncology

## 2018-02-01 ENCOUNTER — Encounter: Payer: Self-pay | Admitting: Oncology

## 2018-03-04 ENCOUNTER — Other Ambulatory Visit: Payer: Self-pay

## 2018-03-04 ENCOUNTER — Encounter: Payer: Self-pay | Admitting: Oncology

## 2018-03-04 ENCOUNTER — Inpatient Hospital Stay: Payer: Medicare Other | Attending: Oncology | Admitting: Oncology

## 2018-03-04 ENCOUNTER — Inpatient Hospital Stay: Payer: Medicare Other

## 2018-03-04 VITALS — BP 158/58 | Temp 96.9°F | Resp 18 | Wt 178.9 lb

## 2018-03-04 DIAGNOSIS — Z794 Long term (current) use of insulin: Secondary | ICD-10-CM | POA: Diagnosis not present

## 2018-03-04 DIAGNOSIS — D469 Myelodysplastic syndrome, unspecified: Secondary | ICD-10-CM | POA: Insufficient documentation

## 2018-03-04 DIAGNOSIS — G2581 Restless legs syndrome: Secondary | ICD-10-CM | POA: Diagnosis not present

## 2018-03-04 DIAGNOSIS — I2510 Atherosclerotic heart disease of native coronary artery without angina pectoris: Secondary | ICD-10-CM | POA: Diagnosis not present

## 2018-03-04 DIAGNOSIS — I110 Hypertensive heart disease with heart failure: Secondary | ICD-10-CM | POA: Insufficient documentation

## 2018-03-04 DIAGNOSIS — E11319 Type 2 diabetes mellitus with unspecified diabetic retinopathy without macular edema: Secondary | ICD-10-CM | POA: Insufficient documentation

## 2018-03-04 DIAGNOSIS — Z951 Presence of aortocoronary bypass graft: Secondary | ICD-10-CM | POA: Diagnosis not present

## 2018-03-04 DIAGNOSIS — K219 Gastro-esophageal reflux disease without esophagitis: Secondary | ICD-10-CM | POA: Insufficient documentation

## 2018-03-04 DIAGNOSIS — E669 Obesity, unspecified: Secondary | ICD-10-CM | POA: Diagnosis not present

## 2018-03-04 DIAGNOSIS — D649 Anemia, unspecified: Secondary | ICD-10-CM

## 2018-03-04 DIAGNOSIS — D696 Thrombocytopenia, unspecified: Secondary | ICD-10-CM | POA: Diagnosis not present

## 2018-03-04 DIAGNOSIS — Z87891 Personal history of nicotine dependence: Secondary | ICD-10-CM | POA: Insufficient documentation

## 2018-03-04 DIAGNOSIS — I509 Heart failure, unspecified: Secondary | ICD-10-CM | POA: Insufficient documentation

## 2018-03-04 DIAGNOSIS — E785 Hyperlipidemia, unspecified: Secondary | ICD-10-CM | POA: Diagnosis not present

## 2018-03-04 DIAGNOSIS — Z79899 Other long term (current) drug therapy: Secondary | ICD-10-CM | POA: Diagnosis not present

## 2018-03-04 LAB — CBC WITH DIFFERENTIAL/PLATELET
Basophils Absolute: 0 10*3/uL (ref 0–0.1)
Basophils Relative: 1 %
EOS PCT: 2 %
Eosinophils Absolute: 0.1 10*3/uL (ref 0–0.7)
HCT: 33.4 % — ABNORMAL LOW (ref 35.0–47.0)
HEMOGLOBIN: 11.1 g/dL — AB (ref 12.0–16.0)
Lymphocytes Relative: 36 %
Lymphs Abs: 2.1 10*3/uL (ref 1.0–3.6)
MCH: 32.1 pg (ref 26.0–34.0)
MCHC: 33.2 g/dL (ref 32.0–36.0)
MCV: 96.7 fL (ref 80.0–100.0)
MONOS PCT: 9 %
Monocytes Absolute: 0.5 10*3/uL (ref 0.2–0.9)
Neutro Abs: 3 10*3/uL (ref 1.4–6.5)
Neutrophils Relative %: 52 %
Platelets: 88 10*3/uL — ABNORMAL LOW (ref 150–440)
RBC: 3.45 MIL/uL — AB (ref 3.80–5.20)
RDW: 15 % — ABNORMAL HIGH (ref 11.5–14.5)
WBC: 5.8 10*3/uL (ref 3.6–11.0)

## 2018-03-04 NOTE — Progress Notes (Signed)
Hematology/Oncology  Follow up note [facility] Telephone:([phone]) [phone] Fax:(336) [phone]   Patient Care Team: Tracie Harrier, MD as PCP - General (Internal Medicine) Alisa Graff, FNP as Nurse Practitioner (Family Medicine) Ubaldo Glassing Javier Docker, MD as Consulting Physician (Cardiology) Solum, Betsey Holiday, MD as Physician Assistant (Endocrinology)  REFERRING PROVIDER: [facility].  CHIEF COMPLAINTS/PURPOSE OF CONSULTATION:  Evaluation of thrombocytopenia  HISTORY OF PRESENTING ILLNESS:  Katherine Hernandez is a  84 y.o.  female with PMH listed below who was referred to me for evaluation of thrombocytopenia.  Patient was seen by [facility] for iron deficiency anemia/epigastric pain/GERD.  Patient had a workup for GI bleed with EGD/colonoscopy.   She has to taken iron supplements twice a day.  Was noted that on her CBC she has thrombocytopenia.  And referred to [place] for further evaluation.  Patient denies having any acute bleeding.  She has easy bruising. Reviewed patient's lab work at [facility].  Her hemoglobin is 11.3, platelet counts 72,000, normal differential.  Her thrombocytopenia can trace back to September 2015, at that time her counts was 95,000. Patient reports mild fatigue otherwise feeling well.  Good appetite no weight loss.  No fever chills, night sweats.  For her thrombocytopenia,US abdomen showed no hepatosplenomegaly.  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 Normal cytogenetics. MDS IPSS score 1.5, very low risk.    INTERVAL HISTORY TING CAGE is a 84 y.o. female who has above history reviewed by me today presents for follow-up visit for management of thrombocytopenia.  She was accompanied by her daughter.  During the interval she  has been doing well. Continues to have chronic easy bruising.  Denies any bleeding events.    Review of Systems  Constitutional: Positive for malaise/fatigue. Negative for chills, fever and weight loss.  HENT: Negative for congestion, ear discharge, ear pain, nosebleeds, sinus pain and sore throat.   Eyes: Negative for double vision, photophobia, pain, discharge and redness.  Respiratory: Negative for cough, hemoptysis, sputum production, shortness of breath and wheezing.   Cardiovascular: Negative for chest pain, palpitations, orthopnea, claudication and leg swelling.  Gastrointestinal: Negative for abdominal pain, blood in stool, constipation, diarrhea, heartburn, melena, nausea and vomiting.  Genitourinary: Negative for dysuria, flank pain, frequency and hematuria.  Musculoskeletal: Negative for back pain, myalgias and neck pain.  Skin: Negative for itching and rash.  Neurological: Negative for dizziness, tingling, tremors, focal weakness, weakness and headaches.  Endo/Heme/Allergies: Negative for environmental allergies. Bruises/bleeds easily.  Psychiatric/Behavioral: Negative for depression and hallucinations. The patient is not nervous/anxious.     MEDICAL HISTORY:  Past Medical History:  Diagnosis Date  . Arthritis   . CAD (coronary artery disease)    Last nuclear 02/2009 EF 72% and neg for ischemia   . CHF (congestive heart failure) ([place])   . Coronary artery disease    CAD with PTCA(cfx)fx -1993,and CABG ,2004  . Diabetes mellitus without complication ([place])   . Diabetic nephropathy ([place])   . Diabetic peripheral neuropathy ([place])   . GERD (gastroesophageal reflux disease)   . Hyperlipidemia   . Hypertension   . Obesity   . Retinopathy, diabetic, background ([place])   . RLS (restless legs syndrome)   . Thrombocytopenia ([place])   . Urinary incontinence  SURGICAL HISTORY: Past Surgical History:  Procedure Laterality Date  . ABDOMINAL HYSTERECTOMY    . BONE MARROW BIOPSY     . BREAST BIOPSY Left 1970   surgical exc  . BREAST BIOPSY Right 09/13/2016   path pending x 2 areas  . CHOLECYSTECTOMY  1968  . COLONOSCOPY WITH PROPOFOL N/A 01/02/2017   Procedure: COLONOSCOPY WITH PROPOFOL;  Surgeon: Lollie Sails, MD;  Location: [facility] ENDOSCOPY;  Service: Endoscopy;  Laterality: N/A;  . CORONARY ANGIOPLASTY    . CORONARY ARTERY BYPASS GRAFT  02/2003  . ery    . ESOPHAGOGASTRODUODENOSCOPY (EGD) WITH PROPOFOL N/A 01/02/2017   Procedure: ESOPHAGOGASTRODUODENOSCOPY (EGD) WITH PROPOFOL;  Surgeon: Lollie Sails, MD;  Location: [facility] ENDOSCOPY;  Service: Endoscopy;  Laterality: N/A;    SOCIAL HISTORY: Social History   Socioeconomic History  . Marital status: Widowed    Spouse name: Not on file  . Number of children: 3  . Years of education: 8  . Highest education level: 8th grade  Occupational History  . Not on file  Social Needs  . Financial resource strain: Not hard at all  . Food insecurity:    Worry: Never true    Inability: Never true  . Transportation needs:    Medical: No    Non-medical: No  Tobacco Use  . Smoking status: Former Smoker    Packs/day: 1.00    Years: 15.00    Pack years: 15.00    Types: Cigarettes    Last attempt to quit: 07/17/1978    Years since quitting: 39.6  . Smokeless tobacco: Never Used  . Tobacco comment: quit in year 1980  Substance and Sexual Activity  . Alcohol use: No  . Drug use: No  . Sexual activity: Never  Lifestyle  . Physical activity:    Days per week: 0 days    Minutes per session: 0 min  . Stress: To some extent  Relationships  . Social connections:    Talks on phone: Three times a week    Gets together: More than three times a week    Attends religious service: More than 4 times per year    Active member of club or organization: No    Attends meetings of clubs or organizations: Never    Relationship status: Widowed  . Intimate partner violence:    Fear of current or ex partner: Patient refused      Emotionally abused: Patient refused    Physically abused: Patient refused    Forced sexual activity: Patient refused  Other Topics Concern  . Not on file  Social History Narrative   History of smoking cigarettes: Former smoker   Quit in year : 1980   Pack-year Hx: 40   No alcohol.   Caffeine :yes    Diet : no   Exercise :no   Occupation : unemployed, retired    No Psychologist, educational status: single,widowed   LV systolic Dysfunction   EF less than 40 >no    FAMILY HISTORY: Family History  Problem Relation Age of Onset  . Breast cancer Sister 45  . Breast cancer Sister   . Diabetes Mother   . Heart disease Mother   . Kidney disease Father   . Hypertension Father   . Fanconi anemia Sister     ALLERGIES:  is allergic to amoxicillin; cefdinir; doxycycline; eliquis [apixaban]; erythromycin; lisinopril; macrodantin [nitrofurantoin]; mobic [meloxicam]; statins; and sulfa antibiotics.  MEDICATIONS:  Current Outpatient Medications  Medication Sig Dispense  Refill  . albuterol (PROVENTIL HFA;VENTOLIN HFA) 108 (90 Base) MCG/ACT inhaler Inhale 2 puffs every 6 (six) hours as needed into the lungs for wheezing or shortness of breath.    . allopurinol (ZYLOPRIM) 100 MG tablet TAKE 1 TABLET BY MOUTH ONCE DAILY    . cetirizine (ZYRTEC) 10 MG tablet Take 10 mg by mouth daily.    . Cyanocobalamin (B-12 COMPLIANCE INJECTION IJ) Inject as directed every 30 (thirty) days.    . ergocalciferol (VITAMIN D2) 50000 units capsule Take 50,000 Units by mouth once a week.    . estradiol (ESTRACE) 0.1 MG/GM vaginal cream Place 1 Applicatorful vaginally 2 (two) times a week. Pt takes 2-3 times a week    . Fluocinolone Acetonide Scalp 0.01 % OIL   2  . fluticasone (FLONASE) 50 MCG/ACT nasal spray PLACE 2 SPRAYS INTO BOTH NOSTRILS AT BEDTIME 16 g 2  . furosemide (LASIX) 20 MG tablet Take 80 mg by mouth daily.     Marland Kitchen gabapentin (NEURONTIN) 300 MG capsule Take 300 mg by mouth 3 (three) times daily. Pt  doesn't always take 3 times a day    . Insulin Glargine (LANTUS SOLOSTAR) 100 UNIT/ML Solostar Pen Inject 20 Units into the skin at bedtime.     . insulin lispro (HUMALOG) 100 UNIT/ML injection Inject 15 Units into the skin 3 (three) times daily before meals.     . iron polysaccharides (NU-IRON) 150 MG capsule Take 150 mg by mouth daily.     Marland Kitchen LORazepam (ATIVAN) 0.5 MG tablet Take 0.5 mg by mouth every 8 (eight) hours as needed.     Marland Kitchen losartan (COZAAR) 100 MG tablet Take 100 mg by mouth daily.    . metoprolol tartrate (LOPRESSOR) 50 MG tablet Take 1 tablet (50 mg total) daily by mouth. 30 tablet 0  . nitroGLYCERIN (NITROSTAT) 0.4 MG SL tablet Place 0.4 mg under the tongue every 5 (five) minutes as needed for chest pain.    . pantoprazole (PROTONIX) 40 MG tablet Take 40 mg by mouth daily.     Marland Kitchen rOPINIRole (REQUIP) 0.5 MG tablet Take 0.5 mg by mouth at bedtime.    . sertraline (ZOLOFT) 50 MG tablet Take 50 mg by mouth daily.     . sucralfate (CARAFATE) 1 g tablet Take 1 g by mouth daily.     . traMADol (ULTRAM) 50 MG tablet Take by mouth.     No current facility-administered medications for this visit.      PHYSICAL EXAMINATION: ECOG PERFORMANCE STATUS: 1 - Symptomatic but completely ambulatory Vitals:   03/04/18 1358  BP: (!) 158/58  Resp: 18  Temp: (!) 96.9 F (36.1 C)   Filed Weights   03/04/18 1358  Weight: 178 lb 14.4 oz (81.1 kg)    Physical Exam  Constitutional: She is oriented to person, place, and time and well-developed, well-nourished, and in no distress. No distress.  HENT:  Head: Normocephalic and atraumatic.  Nose: Nose normal.  Mouth/Throat: Oropharynx is clear and moist. No oropharyngeal exudate.  Eyes: Pupils are equal, round, and reactive to light. Conjunctivae and EOM are normal. Left eye exhibits no discharge. No scleral icterus.  Neck: Normal range of motion. Neck supple.  Cardiovascular: Normal rate and regular rhythm.  No murmur heard. Pulmonary/Chest:  Effort normal. No respiratory distress. She has no wheezes. She has no rales. She exhibits no tenderness.  Abdominal: Soft. She exhibits no distension and no mass. There is no tenderness.  Musculoskeletal: Normal range of motion. She  exhibits no edema.  Lymphadenopathy:    She has no cervical adenopathy.  Neurological: She is alert and oriented to person, place, and time. No cranial nerve deficit. She exhibits normal muscle tone. Coordination normal.  Skin: Skin is warm and dry. She is not diaphoretic. No erythema.  Small bruises.   Psychiatric: Affect and judgment normal.     LABORATORY DATA:  I have reviewed the data as listed Lab Results  Component Value Date   WBC 5.8 03/04/2018   HGB 11.1 (L) 03/04/2018   HCT 33.4 (L) 03/04/2018   MCV 96.7 03/04/2018   PLT 88 (L) 03/04/2018   Recent Labs    05/26/17 0443 05/27/17 0439 09/26/17 1057  NA 140 136 138  K 3.6 3.8 3.4*  CL 105 99* 103  CO2 _0 GLUCOSE 113* 182* 176*  BUN 32* 42* 32*  CREATININE 1.36* 1.54* 1.46*  CALCIUM 9.2 9.1 9.6  GFRNONAA 34* 30* 32*  GFRAA 40* 34* 37*  PROT  --   --  6.9  ALBUMIN  --   --  3.8  AST  --   --  32  ALT  --   --  17  ALKPHOS  --   --  89  BILITOT  --   --  0.5    Bone marrow biopsy results:  Bone Marrow, Aspirate,Biopsy, and Clot, right ilium Mildly hypercellular marrow with mild erythroid and megakaryocytic dysplastic.  See comments Peripheral blood Thrombocytopenia Diagnosis Note The marrow is mildly hypercellular. There is mild dysplasia in the erythroid and megakaryocytic lineages. While the findings are suggestive of a low grade myelodysplastic syndrome, reactive causes of dysplasia (nutritional deficiencies, medication, etc.) must be excluded before a diagnosis is established. Correlation with FISH is recommended Cytogenetics normal.  MDS FISH panel for chromosome 5, 7, 8 negative.  ASSESSMENT & PLAN:  1. Thrombocytopenia ([place])   2. MDS (myelodysplastic syndrome)  (HCC)    #MDS, very low risk, IPSS 1.5.  Continue supportive care.  Close monitor.  If thrombocytopenia continue to decline, may consider about the Dyazide treatment. Labs are reviewed and discussed with patient and her daughter.  Blood counts have been stable. Recommend patient avoid NSAIDs. Patient asked about if dental procedure can be done with her platelet count.  Platelet has been above 50,000, considered to be safe for dental procedures.  Patient may need topical hemostatic agents to facilitate achieving hemostasis.  All questions were answered to patient's satisfaction.  The patient knows to call the clinic with any problems questions or concerns.  Return of visit: 3 months Total face to face encounter time for this patient visit was 15 min. >50% of the time was  spent in counseling and coordination of care.   Earlie Server, MD, PhD. Hematology [place] [facility] Pager- [phone] 03/04/2018

## 2018-03-04 NOTE — Progress Notes (Signed)
Patient here for follow up

## 2018-04-08 NOTE — Progress Notes (Signed)
Patient ID: Katherine Hernandez, female    DOB: Apr 10, 1930, 84 y.o.   MRN: [id number]  HPI  Katherine Hernandez is an 84 y/o female with a history of RLS, HTN, hyperlipidemia, GERD, diabetes, CAD, arthritis and chronic heart failure.   Echo report from 05/26/17 reviewed and showed an EF of 55-65% along with mild MR.   Has not been admitted or been in the ED in the last 6 months.   She presents today for a follow-up visit with a chief complaint of moderate fatigue upon minimal exertion. She describes this as chronic in nature having been present for several years. She has associated pedal edema, back pain and light-headedness along with this. She denies any difficulty sleeping, abdominal distention, palpitations, chest pain, shortness of breath, cough or weight gain. She says that she completely forgot to take her medications this morning. She has not received the flu vaccine yet.   Past Medical History:  Diagnosis Date  . Arthritis   . CAD (coronary artery disease)    Last nuclear 02/2009 EF 72% and neg for ischemia   . CHF (congestive heart failure) ([place])   . Coronary artery disease    CAD with PTCA(cfx)fx -1993,and CABG ,2004  . Diabetes mellitus without complication ([place])   . Diabetic nephropathy ([place])   . Diabetic peripheral neuropathy ([place])   . GERD (gastroesophageal reflux disease)   . Hyperlipidemia   . Hypertension   . Obesity   . Retinopathy, diabetic, background ([place])   . RLS (restless legs syndrome)   . Thrombocytopenia ([place])   . Urinary incontinence    Past Surgical History:  Procedure Laterality Date  . ABDOMINAL HYSTERECTOMY    . BONE MARROW BIOPSY    . BREAST BIOPSY Left 1970   surgical exc  . BREAST BIOPSY Right 09/13/2016   path pending x 2 areas  . CHOLECYSTECTOMY  1968  . COLONOSCOPY WITH PROPOFOL N/A 01/02/2017   Procedure: COLONOSCOPY WITH PROPOFOL;  Surgeon: Lollie Sails, MD;  Location: [facility] ENDOSCOPY;  Service: Endoscopy;  Laterality: N/A;  . CORONARY  ANGIOPLASTY    . CORONARY ARTERY BYPASS GRAFT  02/2003  . ery    . ESOPHAGOGASTRODUODENOSCOPY (EGD) WITH PROPOFOL N/A 01/02/2017   Procedure: ESOPHAGOGASTRODUODENOSCOPY (EGD) WITH PROPOFOL;  Surgeon: Lollie Sails, MD;  Location: [facility] ENDOSCOPY;  Service: Endoscopy;  Laterality: N/A;   Family History  Problem Relation Age of Onset  . Breast cancer Sister 31  . Breast cancer Sister   . Diabetes Mother   . Heart disease Mother   . Kidney disease Father   . Hypertension Father   . Fanconi anemia Sister    Social History   Tobacco Use  . Smoking status: Former Smoker    Packs/day: 1.00    Years: 15.00    Pack years: 15.00    Types: Cigarettes    Last attempt to quit: 07/17/1978    Years since quitting: 39.7  . Smokeless tobacco: Never Used  . Tobacco comment: quit in year 1980  Substance Use Topics  . Alcohol use: No   Allergies  Allergen Reactions  . Amoxicillin Diarrhea  . Cefdinir   . Doxycycline Diarrhea  . Eliquis [Apixaban] Other (See Comments)    Dizziness   . Erythromycin Diarrhea  . Lisinopril Cough  . Macrodantin [Nitrofurantoin] Diarrhea  . Mobic [Meloxicam]     diarrhea  . Statins Other (See Comments)    Joint pain  . Sulfa Antibiotics    Prior to Admission  medications   Medication Sig Start Date End Date Taking? Authorizing Provider  albuterol (PROVENTIL HFA;VENTOLIN HFA) 108 (90 Base) MCG/ACT inhaler Inhale 2 puffs every 6 (six) hours as needed into the lungs for wheezing or shortness of breath.   Yes [provider]  allopurinol (ZYLOPRIM) 100 MG tablet TAKE 1 TABLET BY MOUTH ONCE DAILY 08/06/17  Yes [provider]  cetirizine (ZYRTEC) 10 MG tablet Take 10 mg by mouth daily.   Yes [provider]  Cyanocobalamin (B-12 COMPLIANCE INJECTION IJ) Inject as directed every 30 (thirty) days.   Yes [provider]  ergocalciferol (VITAMIN D2) 50000 units capsule Take 50,000 Units by mouth once a week.   Yes [provider]  estradiol (ESTRACE) 0.1 MG/GM vaginal cream Place 1 Applicatorful vaginally 2 (two) times a week. Pt takes 2-3 times a week   Yes [provider]  fluticasone (FLONASE) 50 MCG/ACT nasal spray PLACE 2 SPRAYS INTO BOTH NOSTRILS AT BEDTIME 12/20/17  Yes Laverle Hobby, MD  furosemide (LASIX) 20 MG tablet Take 80 mg by mouth daily.    Yes [provider]  gabapentin (NEURONTIN) 300 MG capsule Take 300 mg by mouth 3 (three) times daily. Pt doesn't always take 3 times a day   Yes [provider]  Insulin Glargine (LANTUS SOLOSTAR) 100 UNIT/ML Solostar Pen Inject 20 Units into the skin at bedtime.    Yes [provider]  insulin lispro (HUMALOG) 100 UNIT/ML injection Inject 15 Units into the skin 3 (three) times daily before meals.    Yes [provider]  iron polysaccharides (NU-IRON) 150 MG capsule Take 150 mg by mouth daily.  10/23/17 10/23/18 Yes [provider]  LORazepam (ATIVAN) 0.5 MG tablet Take 0.5 mg by mouth every 8 (eight) hours as needed.    Yes [provider]  losartan (COZAAR) 100 MG tablet Take 100 mg by mouth daily.   Yes [provider]  metoprolol tartrate (LOPRESSOR) 25 MG tablet Take 25 mg by mouth 2 (two) times daily.   Yes [provider]  nitroGLYCERIN (NITROSTAT) 0.4 MG SL tablet Place 0.4 mg under the tongue every 5 (five) minutes as needed for chest pain.   Yes [provider]  pantoprazole (PROTONIX) 40 MG tablet Take 40 mg by mouth daily.    Yes [provider]  rOPINIRole (REQUIP) 0.5 MG tablet Take 0.5 mg by mouth at bedtime.   Yes [provider]  sertraline (ZOLOFT) 50 MG tablet Take 50 mg by mouth daily.  10/23/17 10/23/18 Yes [provider]  sucralfate (CARAFATE) 1 g tablet Take 1 g by mouth daily.    Yes [provider]  traMADol (ULTRAM) 50 MG tablet Take 50 mg by mouth every 12 (twelve) hours as needed.  01/09/18  Yes [provider]  Fluocinolone Acetonide Scalp 0.01 % OIL  11/13/17   [provider]    Review of Systems  Constitutional: Positive for fatigue. Negative for appetite change.  HENT: Positive for rhinorrhea. Negative for congestion and sore throat.   Eyes: Negative.   Respiratory: Negative for cough, chest tightness and shortness of breath.   Cardiovascular: Positive for leg swelling. Negative for chest pain and palpitations.  Gastrointestinal: Negative for abdominal distention.  Endocrine: Negative.   Genitourinary: Negative.   Musculoskeletal: Positive for back pain. Negative for neck pain.  Skin: Negative.   Allergic/Immunologic: Negative.   Neurological: Positive for light-headedness (if gets up too quickly). Negative for dizziness and headaches.  Hematological: Negative for adenopathy. Bruises/bleeds easily.  Psychiatric/Behavioral: Negative for dysphoric mood and sleep disturbance (wearing bipap nightly). The patient is not nervous/anxious.    Vitals:   04/09/18 1204  BP: (!) 230/72  Pulse: 66  Temp: 98.1 F (36.7 C)  TempSrc: Oral  SpO2: 98%  Weight: 175 lb 9.6 oz (79.7 kg)  Height: _0  (1.549 m)   Wt Readings from Last 3 Encounters:  04/09/18 175 lb 9.6 oz (79.7 kg)  03/04/18 178 lb 14.4 oz (81.1 kg)  01/15/18 177 lb (80.3 kg)   Lab Results  Component Value Date   CREATININE 1.46 (H) 09/26/2017   CREATININE 1.54 (H) 05/27/2017   CREATININE 1.36 (H) 05/26/2017    Physical Exam  Constitutional: She is oriented to person, place, and time. She appears well-developed and well-nourished.  HENT:  Head: Normocephalic and atraumatic.  Neck: Normal range of motion. Neck supple. No JVD present.  Cardiovascular: Normal rate. An irregular rhythm present.  Pulmonary/Chest: Effort normal. She has no wheezes. She has no rales.  Abdominal: Soft. She exhibits no distension. There is no tenderness.  Musculoskeletal: She exhibits edema (1+ pitting edema in bilateral  lower legs). She exhibits no tenderness.  Neurological: She is alert and oriented to person, place, and time.  Skin: Skin is warm and dry.  Psychiatric: Her behavior is normal. Thought content normal. Her mood appears not anxious.  Nursing note and vitals reviewed.  Assessment & Plan:  1: Chronic heart failure with preserved ejection fraction- - NYHA class III - minimally fluid overloaded today - weighing daily; Reminded to call for an overnight weight gain of >2 pounds or a weekly weight gain of >5 pounds - weight down 2 pounds since she was last here 3 months ago - not adding salt to her food and has been reading food labels for sodium content. Reminded to closely follow a 2065m sodium diet  - saw cardiologist (Ubaldo Glassing 01/29/18 - has been wearing bipap nightly and says that she's been sleeping better  2: HTN- - BP elevated today but she says that she forgot to take her medications this morning and took them right before coming to the appointment; normally her BP looks good - saw PCP (Hande) 10/23/17 - BMP from 11/01/17 reviewed and showed sodium 143, potassium 4.1 and GFR 33  3: Diabetes-  - glucose has been "good" some days and "not so good other days" - saw endocrinologist (Solum) 01/08/18 - A1c on 05/26/17 was 7.7%  4: Lymphedema- - stage 2 - has been elevating her legs more often but edema persists - had been wearing TED hose daily  - limited in exercise due to fatigue - has been wearing lymphapress compression boots with improvement in edema  Patient did not bring her medications nor a list. Each medication was verbally reviewed with the patient and she was encouraged to bring the bottles to every visit to confirm accuracy of list.  Return in 6 months or sooner for any questions/problems before then.

## 2018-04-09 ENCOUNTER — Encounter: Payer: Self-pay | Admitting: Family

## 2018-04-09 ENCOUNTER — Ambulatory Visit: Payer: Medicare Other | Attending: Family | Admitting: Family

## 2018-04-09 ENCOUNTER — Ambulatory Visit: Payer: PRIVATE HEALTH INSURANCE | Admitting: Family

## 2018-04-09 ENCOUNTER — Other Ambulatory Visit: Payer: Self-pay

## 2018-04-09 VITALS — BP 230/72 | HR 66 | Temp 98.1°F | Resp 18 | Ht 61.0 in | Wt 175.6 lb

## 2018-04-09 DIAGNOSIS — Z833 Family history of diabetes mellitus: Secondary | ICD-10-CM | POA: Insufficient documentation

## 2018-04-09 DIAGNOSIS — Z803 Family history of malignant neoplasm of breast: Secondary | ICD-10-CM | POA: Diagnosis not present

## 2018-04-09 DIAGNOSIS — K219 Gastro-esophageal reflux disease without esophagitis: Secondary | ICD-10-CM | POA: Diagnosis not present

## 2018-04-09 DIAGNOSIS — E669 Obesity, unspecified: Secondary | ICD-10-CM | POA: Insufficient documentation

## 2018-04-09 DIAGNOSIS — Z882 Allergy status to sulfonamides status: Secondary | ICD-10-CM | POA: Insufficient documentation

## 2018-04-09 DIAGNOSIS — E785 Hyperlipidemia, unspecified: Secondary | ICD-10-CM | POA: Insufficient documentation

## 2018-04-09 DIAGNOSIS — Z8249 Family history of ischemic heart disease and other diseases of the circulatory system: Secondary | ICD-10-CM | POA: Insufficient documentation

## 2018-04-09 DIAGNOSIS — I5032 Chronic diastolic (congestive) heart failure: Secondary | ICD-10-CM | POA: Insufficient documentation

## 2018-04-09 DIAGNOSIS — N183 Chronic kidney disease, stage 3 unspecified: Secondary | ICD-10-CM

## 2018-04-09 DIAGNOSIS — Z79899 Other long term (current) drug therapy: Secondary | ICD-10-CM | POA: Insufficient documentation

## 2018-04-09 DIAGNOSIS — Z888 Allergy status to other drugs, medicaments and biological substances status: Secondary | ICD-10-CM | POA: Insufficient documentation

## 2018-04-09 DIAGNOSIS — M1990 Unspecified osteoarthritis, unspecified site: Secondary | ICD-10-CM | POA: Diagnosis not present

## 2018-04-09 DIAGNOSIS — I890 Lymphedema, not elsewhere classified: Secondary | ICD-10-CM | POA: Insufficient documentation

## 2018-04-09 DIAGNOSIS — I110 Hypertensive heart disease with heart failure: Secondary | ICD-10-CM | POA: Diagnosis present

## 2018-04-09 DIAGNOSIS — Z881 Allergy status to other antibiotic agents status: Secondary | ICD-10-CM | POA: Diagnosis not present

## 2018-04-09 DIAGNOSIS — E11319 Type 2 diabetes mellitus with unspecified diabetic retinopathy without macular edema: Secondary | ICD-10-CM | POA: Insufficient documentation

## 2018-04-09 DIAGNOSIS — Z880 Allergy status to penicillin: Secondary | ICD-10-CM | POA: Insufficient documentation

## 2018-04-09 DIAGNOSIS — Z9049 Acquired absence of other specified parts of digestive tract: Secondary | ICD-10-CM | POA: Insufficient documentation

## 2018-04-09 DIAGNOSIS — Z951 Presence of aortocoronary bypass graft: Secondary | ICD-10-CM | POA: Diagnosis not present

## 2018-04-09 DIAGNOSIS — E1122 Type 2 diabetes mellitus with diabetic chronic kidney disease: Secondary | ICD-10-CM

## 2018-04-09 DIAGNOSIS — Z794 Long term (current) use of insulin: Secondary | ICD-10-CM | POA: Diagnosis not present

## 2018-04-09 DIAGNOSIS — G2581 Restless legs syndrome: Secondary | ICD-10-CM | POA: Diagnosis not present

## 2018-04-09 DIAGNOSIS — Z9889 Other specified postprocedural states: Secondary | ICD-10-CM | POA: Insufficient documentation

## 2018-04-09 DIAGNOSIS — E1121 Type 2 diabetes mellitus with diabetic nephropathy: Secondary | ICD-10-CM | POA: Diagnosis not present

## 2018-04-09 DIAGNOSIS — I2510 Atherosclerotic heart disease of native coronary artery without angina pectoris: Secondary | ICD-10-CM | POA: Diagnosis not present

## 2018-04-09 DIAGNOSIS — E1142 Type 2 diabetes mellitus with diabetic polyneuropathy: Secondary | ICD-10-CM | POA: Insufficient documentation

## 2018-04-09 DIAGNOSIS — I10 Essential (primary) hypertension: Secondary | ICD-10-CM

## 2018-04-09 DIAGNOSIS — Z87891 Personal history of nicotine dependence: Secondary | ICD-10-CM | POA: Diagnosis not present

## 2018-04-09 DIAGNOSIS — Z841 Family history of disorders of kidney and ureter: Secondary | ICD-10-CM | POA: Insufficient documentation

## 2018-04-09 NOTE — Patient Instructions (Signed)
Continue weighing daily and call for an overnight weight gain of > 2 pounds or a weekly weight gain of >5 pounds. 

## 2018-06-03 ENCOUNTER — Other Ambulatory Visit: Payer: Self-pay

## 2018-06-03 ENCOUNTER — Encounter: Payer: Self-pay | Admitting: Oncology

## 2018-06-03 ENCOUNTER — Inpatient Hospital Stay: Payer: Medicare Other | Attending: Oncology | Admitting: Oncology

## 2018-06-03 ENCOUNTER — Inpatient Hospital Stay: Payer: Medicare Other

## 2018-06-03 VITALS — BP 178/79 | HR 78 | Temp 97.7°F | Resp 18 | Wt 172.1 lb

## 2018-06-03 DIAGNOSIS — D509 Iron deficiency anemia, unspecified: Secondary | ICD-10-CM

## 2018-06-03 DIAGNOSIS — D696 Thrombocytopenia, unspecified: Secondary | ICD-10-CM

## 2018-06-03 DIAGNOSIS — D469 Myelodysplastic syndrome, unspecified: Secondary | ICD-10-CM | POA: Diagnosis not present

## 2018-06-03 DIAGNOSIS — I10 Essential (primary) hypertension: Secondary | ICD-10-CM

## 2018-06-03 DIAGNOSIS — Z87891 Personal history of nicotine dependence: Secondary | ICD-10-CM | POA: Insufficient documentation

## 2018-06-03 DIAGNOSIS — Z79899 Other long term (current) drug therapy: Secondary | ICD-10-CM | POA: Insufficient documentation

## 2018-06-03 DIAGNOSIS — E119 Type 2 diabetes mellitus without complications: Secondary | ICD-10-CM | POA: Diagnosis not present

## 2018-06-03 DIAGNOSIS — Z794 Long term (current) use of insulin: Secondary | ICD-10-CM | POA: Insufficient documentation

## 2018-06-03 LAB — CBC WITH DIFFERENTIAL/PLATELET
Abs Immature Granulocytes: 0.01 10*3/uL (ref 0.00–0.07)
BASOS ABS: 0.1 10*3/uL (ref 0.0–0.1)
Basophils Relative: 1 %
Eosinophils Absolute: 0.1 10*3/uL (ref 0.0–0.5)
Eosinophils Relative: 2 %
HEMATOCRIT: 36.6 % (ref 36.0–46.0)
HEMOGLOBIN: 11.9 g/dL — AB (ref 12.0–15.0)
IMMATURE GRANULOCYTES: 0 %
LYMPHS ABS: 1.9 10*3/uL (ref 0.7–4.0)
LYMPHS PCT: 31 %
MCH: 30.6 pg (ref 26.0–34.0)
MCHC: 32.5 g/dL (ref 30.0–36.0)
MCV: 94.1 fL (ref 80.0–100.0)
Monocytes Absolute: 0.4 10*3/uL (ref 0.1–1.0)
Monocytes Relative: 7 %
NEUTROS ABS: 3.7 10*3/uL (ref 1.7–7.7)
NEUTROS PCT: 59 %
Platelets: 71 10*3/uL — ABNORMAL LOW (ref 150–400)
RBC: 3.89 MIL/uL (ref 3.87–5.11)
RDW: 13.8 % (ref 11.5–15.5)
WBC: 6.2 10*3/uL (ref 4.0–10.5)
nRBC: 0 % (ref 0.0–0.2)

## 2018-06-03 NOTE — Progress Notes (Signed)
Hematology/Oncology  Follow up note [facility] [phone] [phone]   Patient Care Team: Tracie Harrier, MD as PCP - General (Internal Medicine) Alisa Graff, FNP as Nurse Practitioner (Family Medicine) Ubaldo Glassing Javier Docker, MD as Consulting Physician (Cardiology) Solum, Betsey Holiday, MD as Physician Assistant (Endocrinology)  REFERRING PROVIDER: [facility].  CHIEF COMPLAINTS/PURPOSE OF CONSULTATION:  Evaluation of thrombocytopenia  HISTORY OF PRESENTING ILLNESS:  Katherine Hernandez is a  84 y.o.  female with PMH listed below who was referred to me for evaluation of thrombocytopenia.  Patient was seen by [facility] for iron deficiency anemia/epigastric pain/GERD.  Patient had a workup for GI bleed with EGD/colonoscopy.   She has to taken iron supplements twice a day.  Was noted that on her CBC she has thrombocytopenia.  And referred to [place] for further evaluation.  Patient denies having any acute bleeding.  She has easy bruising. Reviewed patient's lab work at [facility].  Her hemoglobin is 11.3, platelet counts 72,000, normal differential.  Her thrombocytopenia can trace back to September 2015, at that time her counts was 95,000. Patient reports mild fatigue otherwise feeling well.  Good appetite no weight loss.  No fever chills, night sweats.  For her thrombocytopenia,US abdomen showed no hepatosplenomegaly.  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 Normal cytogenetics. MDS IPSS score 1.5, very low risk.    INTERVAL HISTORY Katherine Hernandez is a 84 y.o. female who has above history reviewed by me today presents for follow up visit for management of thrombocytopenia. Accompanied by her daughter.  Reports doing well since last  seen.  Chronic easy bruising, unchanged.  Had dental work with crown done, no issue.  Denies any bleeding events.      Review of Systems  Constitutional: Positive for malaise/fatigue. Negative for chills, fever and weight loss.  HENT: Negative for congestion, ear discharge, ear pain, nosebleeds, sinus pain and sore throat.   Eyes: Negative for double vision, photophobia, pain, discharge and redness.  Respiratory: Negative for cough, hemoptysis, sputum production, shortness of breath and wheezing.   Cardiovascular: Negative for chest pain, palpitations, orthopnea, claudication and leg swelling.  Gastrointestinal: Negative for abdominal pain, blood in stool, constipation, diarrhea, heartburn, melena, nausea and vomiting.  Genitourinary: Negative for dysuria, flank pain, frequency and hematuria.  Musculoskeletal: Negative for back pain, myalgias and neck pain.  Skin: Negative for itching and rash.  Neurological: Negative for dizziness, tingling, tremors, focal weakness, weakness and headaches.  Endo/Heme/Allergies: Negative for environmental allergies. Bruises/bleeds easily.  Psychiatric/Behavioral: Negative for depression and hallucinations. The patient is not nervous/anxious.     MEDICAL HISTORY:  Past Medical History:  Diagnosis Date  . Arthritis   . CAD (coronary artery disease)    Last nuclear 02/2009 EF 72% and neg for ischemia   . CHF (congestive heart failure) ([place])   . Coronary artery disease    CAD with PTCA(cfx)fx -1993,and CABG ,2004  . Diabetes mellitus without complication ([place])   . Diabetic nephropathy ([place])   . Diabetic peripheral neuropathy ([place])   . GERD (gastroesophageal reflux disease)   . Hyperlipidemia   . Hypertension   . Obesity   . Retinopathy, diabetic, background ([place])   . RLS (restless legs syndrome)   . Thrombocytopenia ([place])   .  Urinary incontinence     SURGICAL HISTORY: Past Surgical History:  Procedure Laterality Date  . ABDOMINAL  HYSTERECTOMY    . BONE MARROW BIOPSY    . BREAST BIOPSY Left 1970   surgical exc  . BREAST BIOPSY Right 09/13/2016   path pending x 2 areas  . CHOLECYSTECTOMY  1968  . COLONOSCOPY WITH PROPOFOL N/A 01/02/2017   Procedure: COLONOSCOPY WITH PROPOFOL;  Surgeon: Lollie Sails, MD;  Location: [facility] ENDOSCOPY;  Service: Endoscopy;  Laterality: N/A;  . CORONARY ANGIOPLASTY    . CORONARY ARTERY BYPASS GRAFT  02/2003  . ery    . ESOPHAGOGASTRODUODENOSCOPY (EGD) WITH PROPOFOL N/A 01/02/2017   Procedure: ESOPHAGOGASTRODUODENOSCOPY (EGD) WITH PROPOFOL;  Surgeon: Lollie Sails, MD;  Location: [facility] ENDOSCOPY;  Service: Endoscopy;  Laterality: N/A;    SOCIAL HISTORY: Social History   Socioeconomic History  . Marital status: Widowed    Spouse name: Not on file  . Number of children: 3  . Years of education: 8  . Highest education level: 8th grade  Occupational History  . Occupation: retired  Scientific laboratory technician  . Financial resource strain: Not hard at all  . Food insecurity:    Worry: Never true    Inability: Never true  . Transportation needs:    Medical: No    Non-medical: No  Tobacco Use  . Smoking status: Former Smoker    Packs/day: 1.00    Years: 15.00    Pack years: 15.00    Types: Cigarettes    Last attempt to quit: 07/17/1978    Years since quitting: 39.9  . Smokeless tobacco: Never Used  . Tobacco comment: quit in year 1980  Substance and Sexual Activity  . Alcohol use: No  . Drug use: No  . Sexual activity: Never  Lifestyle  . Physical activity:    Days per week: 0 days    Minutes per session: 0 min  . Stress: To some extent  Relationships  . Social connections:    Talks on phone: Three times a week    Gets together: More than three times a week    Attends religious service: More than 4 times per year    Active member of club or organization: No    Attends meetings of clubs or organizations: Never    Relationship status: Widowed  . Intimate partner violence:     Fear of current or ex partner: Patient refused    Emotionally abused: Patient refused    Physically abused: Patient refused    Forced sexual activity: Patient refused  Other Topics Concern  . Not on file  Social History Narrative   History of smoking cigarettes: Former smoker   Quit in year : 1980   Pack-year Hx: 40   No alcohol.   Caffeine :yes    Diet : no   Exercise :no   Occupation : unemployed, retired    No Psychologist, educational status: single,widowed   LV systolic Dysfunction   EF less than 40 >no    FAMILY HISTORY: Family History  Problem Relation Age of Onset  . Breast cancer Sister 33  . Breast cancer Sister   . Diabetes Mother   . Heart disease Mother   . Kidney disease Father   . Hypertension Father   . Fanconi anemia Sister     ALLERGIES:  is allergic to amoxicillin; cefdinir; doxycycline; eliquis [apixaban]; erythromycin; lisinopril; macrodantin [nitrofurantoin]; mobic [meloxicam]; statins; and sulfa antibiotics.  MEDICATIONS:  Current Outpatient Medications  Medication Sig Dispense Refill  . albuterol (PROVENTIL HFA;VENTOLIN HFA) 108 (90 Base) MCG/ACT inhaler Inhale 2 puffs every 6 (six) hours as needed into the lungs for wheezing or shortness of breath.    . allopurinol (ZYLOPRIM) 100 MG tablet TAKE 1 TABLET BY MOUTH ONCE DAILY    . cetirizine (ZYRTEC) 10 MG tablet Take 10 mg by mouth daily.    . Cyanocobalamin (B-12 COMPLIANCE INJECTION IJ) Inject as directed every 30 (thirty) days.    . ergocalciferol (VITAMIN D2) 50000 units capsule Take 50,000 Units by mouth once a week.    . estradiol (ESTRACE) 0.1 MG/GM vaginal cream Place 1 Applicatorful vaginally 2 (two) times a week. Pt takes 2-3 times a week    . Fluocinolone Acetonide Scalp 0.01 % OIL   2  . fluticasone (FLONASE) 50 MCG/ACT nasal spray PLACE 2 SPRAYS INTO BOTH NOSTRILS AT BEDTIME 16 g 2  . furosemide (LASIX) 20 MG tablet Take 80 mg by mouth daily.     Marland Kitchen gabapentin (NEURONTIN) 300 MG capsule Take  300 mg by mouth 3 (three) times daily. Pt doesn't always take 3 times a day    . Insulin Glargine (LANTUS SOLOSTAR) 100 UNIT/ML Solostar Pen Inject 20 Units into the skin at bedtime.     . iron polysaccharides (NU-IRON) 150 MG capsule Take 150 mg by mouth daily.     Marland Kitchen liraglutide (VICTOZA) 18 MG/3ML SOPN Inject into the skin. Inject 0.3 mLs (1.8 mg total) subcutaneously once daily    . LORazepam (ATIVAN) 0.5 MG tablet Take 0.5 mg by mouth every 8 (eight) hours as needed.     Marland Kitchen losartan (COZAAR) 100 MG tablet Take 100 mg by mouth daily.    . metoprolol tartrate (LOPRESSOR) 25 MG tablet Take 25 mg by mouth 2 (two) times daily.    . nitroGLYCERIN (NITROSTAT) 0.4 MG SL tablet Place 0.4 mg under the tongue every 5 (five) minutes as needed for chest pain.    . pantoprazole (PROTONIX) 40 MG tablet Take 40 mg by mouth daily.     Marland Kitchen rOPINIRole (REQUIP) 0.5 MG tablet Take 0.5 mg by mouth at bedtime.    . sertraline (ZOLOFT) 50 MG tablet Take 50 mg by mouth daily.     . sucralfate (CARAFATE) 1 g tablet Take 1 g by mouth daily.     . traMADol (ULTRAM) 50 MG tablet Take 50 mg by mouth every 12 (twelve) hours as needed.     . insulin lispro (HUMALOG) 100 UNIT/ML injection Inject 15 Units into the skin 3 (three) times daily before meals.      No current facility-administered medications for this visit.      PHYSICAL EXAMINATION: ECOG PERFORMANCE STATUS: 1 - Symptomatic but completely ambulatory Vitals:   06/03/18 1340  BP: (!) 178/79  Pulse: 78  Resp: 18  Temp: 97.7 F (36.5 C)   Filed Weights   06/03/18 1340  Weight: 172 lb 1.6 oz (78.1 kg)    Physical Exam  Constitutional: She is oriented to person, place, and time and well-developed, well-nourished, and in no distress. No distress.  HENT:  Head: Normocephalic and atraumatic.  Nose: Nose normal.  Mouth/Throat: Oropharynx is clear and moist. No oropharyngeal exudate.  Eyes: Pupils are equal, round, and reactive to light. Conjunctivae and EOM  are normal. Left eye exhibits no discharge. No scleral icterus.  Neck: Normal range of motion. Neck supple.  Cardiovascular: Normal rate and regular rhythm.  No murmur heard. Pulmonary/Chest: Effort normal. No  respiratory distress. She has no wheezes. She has no rales. She exhibits no tenderness.  Abdominal: Soft. She exhibits no distension and no mass. There is no tenderness.  Musculoskeletal: Normal range of motion. She exhibits no edema.  Lymphadenopathy:    She has no cervical adenopathy.  Neurological: She is alert and oriented to person, place, and time. No cranial nerve deficit. She exhibits normal muscle tone. Coordination normal.  Skin: Skin is warm and dry. She is not diaphoretic. No erythema.  Small bruises.   Psychiatric: Affect and judgment normal.     LABORATORY DATA:  I have reviewed the data as listed Lab Results  Component Value Date   WBC 6.2 06/03/2018   HGB 11.9 (L) 06/03/2018   HCT 36.6 06/03/2018   MCV 94.1 06/03/2018   PLT 71 (L) 06/03/2018   Recent Labs    09/26/17 1057  NA 138  K 3.4*  CL 103  CO2 25  GLUCOSE 176*  BUN 32*  CREATININE 1.46*  CALCIUM 9.6  GFRNONAA 32*  GFRAA 37*  PROT 6.9  ALBUMIN 3.8  AST 32  ALT 17  ALKPHOS 89  BILITOT 0.5    Bone marrow biopsy results:  Bone Marrow, Aspirate,Biopsy, and Clot, right ilium Mildly hypercellular marrow with mild erythroid and megakaryocytic dysplastic.  See comments Peripheral blood Thrombocytopenia Diagnosis Note The marrow is mildly hypercellular. There is mild dysplasia in the erythroid and megakaryocytic lineages. While the findings are suggestive of a low grade myelodysplastic syndrome, reactive causes of dysplasia (nutritional deficiencies, medication, etc.) must be excluded before a diagnosis is established. Correlation with FISH is recommended Cytogenetics normal.  MDS FISH panel for chromosome 5, 7, 8 negative.  ASSESSMENT & PLAN:  1. Thrombocytopenia ([place])   2. MDS  (myelodysplastic syndrome) ([facility])    #MDS, very low risk, IPSS 1.5.  Continue supportive care.  Close monitor.  Labs are reviewed and discussed with patient and her daughter.  Blood counts have been stable. Continue monitor counts every 3 months.  Avoid NSAIDs. All questions were answered to patient's satisfaction.  The patient knows to call the clinic with any problems questions or concerns.  Return of visit: 3 months  Total face to face encounter time for this patient visit was 57mn. >50% of the time was  spent in counseling and coordination of care.   ZEarlie Server MD, PhD. Hematology Oncology CRober[facility].Pager- [phone]/18/2019

## 2018-06-03 NOTE — Progress Notes (Signed)
Patient here for follow up. Daughter present with patient.

## 2018-08-01 ENCOUNTER — Telehealth: Payer: Self-pay | Admitting: *Deleted

## 2018-08-01 NOTE — Telephone Encounter (Signed)
Patient needing tooth extraction and he is asking if there are any preliminary precautions they need to take or if they can go ahead and pull the tooth. They will be closed for the weekend as of this afternoon and will return to the office Monday [phone] Please return their call with suggestions.  Dx:  Thrombocytopenia ([place])   Ref Range & Units 7mo ago (06/03/18) 58mo ago (03/04/18) 81mo ago (12/03/17) 54mo ago (10/31/17) 54mo ago (09/26/17) 57yr ago (05/26/17) 80yr ago (05/25/17)  WBC 4.0 - 10.5 K/uL 6.2  5.8 R 6.5 R 6.2 R 6.7 R 7.2 R 7.7 R  RBC 3.87 - 5.11 MIL/uL 3.89  3.45Low  R 3.55Low  R 3.86 R 3.61Low  R 3.69Low  R 4.02 R  Hemoglobin 12.0 - 15.0 g/dL 11.9Low   11.1Low  R 11.3Low  R 12.5 R 11.7Low  R 11.7Low  R 12.2 R  HCT 36.0 - 46.0 % 36.6  33.4Low  R 33.9Low  R 37.6 R 35.2 R 34.9Low  R 37.6 R  MCV 80.0 - 100.0 fL 94.1  96.7  95.4  97.4  97.5  94.6  93.6   MCH 26.0 - 34.0 pg 30.6  32.1  31.7  32.4  32.5  31.6  30.5   MCHC 30.0 - 36.0 g/dL 32.5  33.2 R 33.2 R 33.2 R 33.3 R 33.4 R 32.6 R  RDW 11.5 - 15.5 % 13.8  15.0High  R 14.2 R 13.8 R 15.5High  R 15.3High  R 15.3High  R  Platelets 150 - 400 K/uL 71Low   88Low  R 79Low  R 81Low  R 91Low  R 84Low  R 89Low  R  Comment: REPEATED TO VERIFY  nRBC 0.0 - 0.2 % 0.0         Neutrophils Relative % % 59  52  51  57  59     Neutro Abs 1.7 - 7.7 K/uL 3.7  3.0 R 3.4 R 3.6 R 3.9 R    Lymphocytes Relative % 31  36  38  30  31     Lymphs Abs 0.7 - 4.0 K/uL 1.9  2.1 R 2.4 R 1.9 R 2.1 R    Monocytes Relative % 7  9  8  9  7      Monocytes Absolute 0.1 - 1.0 K/uL 0.4  0.5 R 0.5 R 0.5 R 0.5 R    Eosinophils Relative % 2  2  2  3  2      Eosinophils Absolute 0.0 - 0.5 K/uL 0.1  0.1 R 0.1 R 0.2 R 0.1 R    Basophils Relative % 1  1  1  1  1      Basophils Absolute 0.0 - 0.1 K/uL 0.1  0.0 R, CM 0.1 R, CM 0.0 R, CM 0.1 R, CM    Immature Granulocytes % 0         Abs Immature Granulocytes 0.00 - 0.07 K/uL 0.01         Comment: Performed at [facility],  [address] [place], [place] [address]  Resulting Agency  [facility] LAB [place] [facility] [place] [facility] [place] [facility] [place] [facility] [place] [facility] [place] [facility]      Specimen Collected: 06/03/18 13:23  Last Resulted: 06/03/18 13:43

## 2018-08-02 ENCOUNTER — Inpatient Hospital Stay: Payer: Medicare Other | Attending: Oncology

## 2018-08-02 ENCOUNTER — Other Ambulatory Visit: Payer: Self-pay

## 2018-08-02 DIAGNOSIS — Z79899 Other long term (current) drug therapy: Secondary | ICD-10-CM | POA: Insufficient documentation

## 2018-08-02 DIAGNOSIS — D696 Thrombocytopenia, unspecified: Secondary | ICD-10-CM | POA: Diagnosis not present

## 2018-08-02 DIAGNOSIS — D509 Iron deficiency anemia, unspecified: Secondary | ICD-10-CM | POA: Diagnosis not present

## 2018-08-02 DIAGNOSIS — Z794 Long term (current) use of insulin: Secondary | ICD-10-CM | POA: Diagnosis not present

## 2018-08-02 DIAGNOSIS — I10 Essential (primary) hypertension: Secondary | ICD-10-CM | POA: Insufficient documentation

## 2018-08-02 DIAGNOSIS — Z87891 Personal history of nicotine dependence: Secondary | ICD-10-CM | POA: Diagnosis not present

## 2018-08-02 DIAGNOSIS — E119 Type 2 diabetes mellitus without complications: Secondary | ICD-10-CM | POA: Diagnosis not present

## 2018-08-02 LAB — CBC WITH DIFFERENTIAL/PLATELET
ABS IMMATURE GRANULOCYTES: 0.03 10*3/uL (ref 0.00–0.07)
Basophils Absolute: 0.1 10*3/uL (ref 0.0–0.1)
Basophils Relative: 1 %
Eosinophils Absolute: 0.1 10*3/uL (ref 0.0–0.5)
Eosinophils Relative: 2 %
HCT: 34.2 % — ABNORMAL LOW (ref 36.0–46.0)
Hemoglobin: 11.1 g/dL — ABNORMAL LOW (ref 12.0–15.0)
Immature Granulocytes: 1 %
Lymphocytes Relative: 35 %
Lymphs Abs: 2.2 10*3/uL (ref 0.7–4.0)
MCH: 31.2 pg (ref 26.0–34.0)
MCHC: 32.5 g/dL (ref 30.0–36.0)
MCV: 96.1 fL (ref 80.0–100.0)
MONO ABS: 0.4 10*3/uL (ref 0.1–1.0)
Monocytes Relative: 6 %
NEUTROS ABS: 3.4 10*3/uL (ref 1.7–7.7)
Neutrophils Relative %: 55 %
Platelets: 71 10*3/uL — ABNORMAL LOW (ref 150–400)
RBC: 3.56 MIL/uL — ABNORMAL LOW (ref 3.87–5.11)
RDW: 14 % (ref 11.5–15.5)
WBC: 6.1 10*3/uL (ref 4.0–10.5)
nRBC: 0 % (ref 0.0–0.2)

## 2018-08-02 NOTE — Telephone Encounter (Signed)
Please arrange patient to have lab encounter for cbc. Thanks.  If platelet is above 50,000, ok to proceed with dental extraction. Recommend prophylactic local hemostatic measures. Thanks.

## 2018-08-02 NOTE — Telephone Encounter (Signed)
Spoke to patient's daughter, Mariann Laster, she will be bringing patient in today for labwork. Will forward results to dentist office once they result.

## 2018-09-02 ENCOUNTER — Inpatient Hospital Stay: Payer: Medicare Other

## 2018-09-02 ENCOUNTER — Other Ambulatory Visit: Payer: Self-pay

## 2018-09-02 ENCOUNTER — Inpatient Hospital Stay: Payer: Medicare Other | Attending: Oncology | Admitting: Oncology

## 2018-09-02 ENCOUNTER — Encounter: Payer: Self-pay | Admitting: Oncology

## 2018-09-02 VITALS — BP 157/70 | HR 72 | Temp 98.1°F | Resp 18 | Wt 172.9 lb

## 2018-09-02 DIAGNOSIS — D696 Thrombocytopenia, unspecified: Secondary | ICD-10-CM | POA: Insufficient documentation

## 2018-09-02 DIAGNOSIS — D469 Myelodysplastic syndrome, unspecified: Secondary | ICD-10-CM | POA: Diagnosis not present

## 2018-09-02 DIAGNOSIS — Z794 Long term (current) use of insulin: Secondary | ICD-10-CM | POA: Diagnosis not present

## 2018-09-02 DIAGNOSIS — Z79899 Other long term (current) drug therapy: Secondary | ICD-10-CM | POA: Insufficient documentation

## 2018-09-02 DIAGNOSIS — K219 Gastro-esophageal reflux disease without esophagitis: Secondary | ICD-10-CM | POA: Insufficient documentation

## 2018-09-02 DIAGNOSIS — Z87891 Personal history of nicotine dependence: Secondary | ICD-10-CM

## 2018-09-02 DIAGNOSIS — E1121 Type 2 diabetes mellitus with diabetic nephropathy: Secondary | ICD-10-CM | POA: Diagnosis not present

## 2018-09-02 DIAGNOSIS — I10 Essential (primary) hypertension: Secondary | ICD-10-CM | POA: Diagnosis not present

## 2018-09-02 DIAGNOSIS — D509 Iron deficiency anemia, unspecified: Secondary | ICD-10-CM | POA: Diagnosis not present

## 2018-09-02 DIAGNOSIS — D649 Anemia, unspecified: Secondary | ICD-10-CM

## 2018-09-02 LAB — CBC WITH DIFFERENTIAL/PLATELET
Abs Immature Granulocytes: 0.02 10*3/uL (ref 0.00–0.07)
Basophils Absolute: 0.1 10*3/uL (ref 0.0–0.1)
Basophils Relative: 1 %
Eosinophils Absolute: 0.1 10*3/uL (ref 0.0–0.5)
Eosinophils Relative: 1 %
HCT: 34 % — ABNORMAL LOW (ref 36.0–46.0)
Hemoglobin: 11.2 g/dL — ABNORMAL LOW (ref 12.0–15.0)
IMMATURE GRANULOCYTES: 0 %
Lymphocytes Relative: 31 %
Lymphs Abs: 2.3 10*3/uL (ref 0.7–4.0)
MCH: 31.5 pg (ref 26.0–34.0)
MCHC: 32.9 g/dL (ref 30.0–36.0)
MCV: 95.5 fL (ref 80.0–100.0)
Monocytes Absolute: 0.5 10*3/uL (ref 0.1–1.0)
Monocytes Relative: 7 %
Neutro Abs: 4.5 10*3/uL (ref 1.7–7.7)
Neutrophils Relative %: 60 %
Platelets: 81 10*3/uL — ABNORMAL LOW (ref 150–400)
RBC: 3.56 MIL/uL — ABNORMAL LOW (ref 3.87–5.11)
RDW: 13.8 % (ref 11.5–15.5)
WBC: 7.5 10*3/uL (ref 4.0–10.5)
nRBC: 0 % (ref 0.0–0.2)

## 2018-09-02 NOTE — Progress Notes (Signed)
Patient here for follow up. No concerns voiced.  °

## 2018-09-02 NOTE — Progress Notes (Signed)
Hematology/Oncology  Follow up note [facility] Telephone:(336) [phone] Fax:(336) [phone]   Patient Care Team: Tracie Harrier, MD as PCP - General (Internal Medicine) Alisa Graff, FNP as Nurse Practitioner (Family Medicine) Ubaldo Glassing Javier Docker, MD as Consulting Physician (Cardiology) Solum, Betsey Holiday, MD as Physician Assistant (Endocrinology)  REFERRING PROVIDER: [facility].  CHIEF COMPLAINTS/PURPOSE OF CONSULTATION:  Evaluation of thrombocytopenia  HISTORY OF PRESENTING ILLNESS:  Katherine Hernandez is a  85 y.o.  female with PMH listed below who was referred to me for evaluation of thrombocytopenia.  Patient was seen by [facility] for iron deficiency anemia/epigastric pain/GERD.  Patient had a workup for GI bleed with EGD/colonoscopy.   She has to taken iron supplements twice a day.  Was noted that on her CBC she has thrombocytopenia.  And referred to [place] for further evaluation.  Patient denies having any acute bleeding.  She has easy bruising. Reviewed patient's lab work at [facility].  Her hemoglobin is 11.3, platelet counts 72,000, normal differential.  Her thrombocytopenia can trace back to September 2015, at that time her counts was 95,000. Patient reports mild fatigue otherwise feeling well.  Good appetite no weight loss.  No fever chills, night sweats.  For her thrombocytopenia,US abdomen showed no hepatosplenomegaly.  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 Normal cytogenetics. MDS IPSS score 1.5, very low risk.    INTERVAL HISTORY Katherine Hernandez is a 85 y.o. female who has above history reviewed by me today presents for follow up visit for management of thrombocytopenia-low-grade MDS During interval, patient has had tooth  extractions and did well. Accompanied by 1 of her daughters. Reporting doing well since last visit. Chronic easy bruising not changed.  No acute bleeding events     Review of Systems  Constitutional: Positive for malaise/fatigue. Negative for chills, fever and weight loss.  HENT: Negative for sore throat.   Eyes: Negative for redness.  Respiratory: Negative for cough, shortness of breath and wheezing.   Cardiovascular: Negative for chest pain, palpitations and leg swelling.  Gastrointestinal: Negative for abdominal pain, blood in stool, nausea and vomiting.  Genitourinary: Negative for dysuria.  Musculoskeletal: Negative for myalgias.  Skin: Negative for rash.  Neurological: Negative for dizziness, tingling and tremors.  Endo/Heme/Allergies: Does not bruise/bleed easily.  Psychiatric/Behavioral: Negative for hallucinations.    MEDICAL HISTORY:  Past Medical History:  Diagnosis Date  . Arthritis   . CAD (coronary artery disease)    Last nuclear 02/2009 EF 72% and neg for ischemia   . CHF (congestive heart failure) ([place])   . Coronary artery disease    CAD with PTCA(cfx)fx -1993,and CABG ,2004  . Diabetes mellitus without complication ([place])   . Diabetic nephropathy ([place])   . Diabetic peripheral neuropathy ([place])   . GERD (gastroesophageal reflux disease)   . Hyperlipidemia   . Hypertension   . Obesity   . Retinopathy, diabetic, background ([place])   . RLS (restless legs syndrome)   . Thrombocytopenia ([place])   . Urinary incontinence     SURGICAL HISTORY: Past Surgical History:  Procedure Laterality Date  . ABDOMINAL HYSTERECTOMY    . BONE MARROW BIOPSY    . BREAST BIOPSY Left 1970   surgical exc  . BREAST BIOPSY Right 09/13/2016   path  pending x 2 areas  . CHOLECYSTECTOMY  1968  . COLONOSCOPY WITH PROPOFOL N/A 01/02/2017   Procedure: COLONOSCOPY WITH PROPOFOL;  Surgeon: Lollie Sails, MD;  [facility];  Service: Endoscopy;  Laterality: N/A;  . CORONARY  ANGIOPLASTY    . CORONARY ARTERY BYPASS GRAFT  02/2003  . ery    . ESOPHAGOGASTRODUODENOSCOPY (EGD) WITH PROPOFOL N/A 01/02/2017   Procedure: ESOPHAGOGASTRODUODENOSCOPY (EGD) WITH PROPOFOL;  Surgeon: Lollie Sails, MD;  Location: [facility] ENDOSCOPY;  Service: Endoscopy;  Laterality: N/A;    SOCIAL HISTORY: Social History   Socioeconomic History  . Marital status: Widowed    Spouse name: Not on file  . Number of children: 3  . Years of education: 8  . Highest education level: 8th grade  Occupational History  . Occupation: retired  Scientific laboratory technician  . Financial resource strain: Not hard at all  . Food insecurity:    Worry: Never true    Inability: Never true  . Transportation needs:    Medical: No    Non-medical: No  Tobacco Use  . Smoking status: Former Smoker    Packs/day: 1.00    Years: 15.00    Pack years: 15.00    Types: Cigarettes    Last attempt to quit: 07/17/1978    Years since quitting: 40.1  . Smokeless tobacco: Never Used  . Tobacco comment: quit in year 1980  Substance and Sexual Activity  . Alcohol use: No  . Drug use: No  . Sexual activity: Never  Lifestyle  . Physical activity:    Days per week: 0 days    Minutes per session: 0 min  . Stress: To some extent  Relationships  . Social connections:    Talks on phone: Three times a week    Gets together: More than three times a week    Attends religious service: More than 4 times per year    Active member of club or organization: No    Attends meetings of clubs or organizations: Never    Relationship status: Widowed  . Intimate partner violence:    Fear of current or ex partner: Patient refused    Emotionally abused: Patient refused    Physically abused: Patient refused    Forced sexual activity: Patient refused  Other Topics Concern  . Not on file  Social History Narrative   History of smoking cigarettes: Former smoker   Quit in year : 1980   Pack-year Hx: 40   No alcohol.   Caffeine :yes    Diet  : no   Exercise :no   Occupation : unemployed, retired    No Psychologist, educational status: single,widowed   LV systolic Dysfunction   EF less than 40 >no    FAMILY HISTORY: Family History  Problem Relation Age of Onset  . Breast cancer Sister 14  . Breast cancer Sister   . Diabetes Mother   . Heart disease Mother   . Kidney disease Father   . Hypertension Father   . Fanconi anemia Sister     ALLERGIES:  is allergic to amoxicillin; cefdinir; doxycycline; eliquis [apixaban]; erythromycin; lisinopril; macrodantin [nitrofurantoin]; mobic [meloxicam]; statins; and sulfa antibiotics.  MEDICATIONS:  Current Outpatient Medications  Medication Sig Dispense Refill  . albuterol (PROVENTIL HFA;VENTOLIN HFA) 108 (90 Base) MCG/ACT inhaler Inhale 2 puffs every 6 (six) hours as needed into the lungs for wheezing or shortness of breath.    . allopurinol (ZYLOPRIM) 100 MG tablet TAKE 1 TABLET BY  MOUTH ONCE DAILY    . cetirizine (ZYRTEC) 10 MG tablet Take 10 mg by mouth daily.    . Cyanocobalamin (B-12 COMPLIANCE INJECTION IJ) Inject as directed every 30 (thirty) days.    . ergocalciferol (VITAMIN D2) 50000 units capsule Take 50,000 Units by mouth once a week.    . estradiol (ESTRACE) 0.1 MG/GM vaginal cream Place 1 Applicatorful vaginally 2 (two) times a week. Pt takes 2-3 times a week    . fluticasone (FLONASE) 50 MCG/ACT nasal spray PLACE 2 SPRAYS INTO BOTH NOSTRILS AT BEDTIME 16 g 2  . furosemide (LASIX) 20 MG tablet Take 80 mg by mouth daily.     Marland Kitchen gabapentin (NEURONTIN) 300 MG capsule Take 300 mg by mouth 3 (three) times daily. Pt doesn't always take 3 times a day    . Insulin Glargine (LANTUS SOLOSTAR) 100 UNIT/ML Solostar Pen Inject 32 Units into the skin at bedtime.     . iron polysaccharides (NU-IRON) 150 MG capsule Take 150 mg by mouth daily.     Marland Kitchen liraglutide (VICTOZA) 18 MG/3ML SOPN Inject into the skin. Inject 0.3 mLs (1.8 mg total) subcutaneously once daily    . LORazepam (ATIVAN) 0.5  MG tablet Take 0.5 mg by mouth every 8 (eight) hours as needed.     Marland Kitchen losartan (COZAAR) 100 MG tablet Take 100 mg by mouth daily.    . metoprolol tartrate (LOPRESSOR) 25 MG tablet Take 25 mg by mouth 2 (two) times daily.    . nitroGLYCERIN (NITROSTAT) 0.4 MG SL tablet Place 0.4 mg under the tongue every 5 (five) minutes as needed for chest pain.    . pantoprazole (PROTONIX) 40 MG tablet Take 40 mg by mouth daily.     Marland Kitchen rOPINIRole (REQUIP) 0.5 MG tablet Take 0.5 mg by mouth at bedtime.    . sertraline (ZOLOFT) 50 MG tablet Take 50 mg by mouth daily.     . sucralfate (CARAFATE) 1 g tablet Take 1 g by mouth daily.     . traMADol (ULTRAM) 50 MG tablet Take 50 mg by mouth every 12 (twelve) hours as needed.     . Fluocinolone Acetonide Scalp 0.01 % OIL   2  . insulin lispro (HUMALOG) 100 UNIT/ML injection Inject 15 Units into the skin 3 (three) times daily before meals.      No current facility-administered medications for this visit.      PHYSICAL EXAMINATION: ECOG PERFORMANCE STATUS: 1 - Symptomatic but completely ambulatory Vitals:   09/02/18 1402  BP: (!) 157/70  Pulse: 72  Resp: 18  Temp: 98.1 F (36.7 C)   Filed Weights   09/02/18 1402  Weight: 172 lb 14.4 oz (78.4 kg)    Physical Exam  Constitutional: She is oriented to person, place, and time and well-developed, well-nourished, and in no distress. No distress.  HENT:  Head: Normocephalic and atraumatic.  Nose: Nose normal.  Mouth/Throat: Oropharynx is clear and moist. No oropharyngeal exudate.  Eyes: Pupils are equal, round, and reactive to light. Conjunctivae and EOM are normal. Left eye exhibits no discharge. No scleral icterus.  Neck: Normal range of motion. Neck supple.  Cardiovascular: Normal rate and regular rhythm.  No murmur heard. Pulmonary/Chest: Effort normal. No respiratory distress. She has no wheezes. She has no rales. She exhibits no tenderness.  Abdominal: Soft. She exhibits no distension and no mass. There  is no abdominal tenderness.  Musculoskeletal: Normal range of motion.        General: No edema.  Lymphadenopathy:    She has no cervical adenopathy.  Neurological: She is alert and oriented to person, place, and time. No cranial nerve deficit. She exhibits normal muscle tone. Coordination normal.  Skin: Skin is warm and dry. She is not diaphoretic. No erythema.  Small bruises.   Psychiatric: Affect and judgment normal.     LABORATORY DATA:  I have reviewed the data as listed Lab Results  Component Value Date   WBC 7.5 09/02/2018   HGB 11.2 (L) 09/02/2018   HCT 34.0 (L) 09/02/2018   MCV 95.5 09/02/2018   PLT 81 (L) 09/02/2018   Recent Labs    09/26/17 1057  NA 138  K 3.4*  CL 103  CO2 25  GLUCOSE 176*  BUN 32*  CREATININE 1.46*  CALCIUM 9.6  GFRNONAA 32*  GFRAA 37*  PROT 6.9  ALBUMIN 3.8  AST 32  ALT 17  ALKPHOS 89  BILITOT 0.5    Bone marrow biopsy results:  Bone Marrow, Aspirate,Biopsy, and Clot, right ilium Mildly hypercellular marrow with mild erythroid and megakaryocytic dysplastic.  See comments Peripheral blood Thrombocytopenia Diagnosis Note The marrow is mildly hypercellular. There is mild dysplasia in the erythroid and megakaryocytic lineages. While the findings are suggestive of a low grade myelodysplastic syndrome, reactive causes of dysplasia (nutritional deficiencies, medication, etc.) must be excluded before a diagnosis is established. Correlation with FISH is recommended Cytogenetics normal.  MDS FISH panel for chromosome 5, 7, 8 negative.  ASSESSMENT & PLAN:  1. Thrombocytopenia ([place])   2. MDS (myelodysplastic syndrome) ([place])   3. Normocytic anemia    #MDS, very low risk, IPSS 1.5.   Labs reviewed and discussed with patient.  Her counts has been relatively stable. Thrombocytopenia with platelet count of 81,000, at her baseline. Hemoglobin at baseline. She is asymptomatic. Recommend continue supportive care.  Continue monitor counts  every 3 months.  Avoid NSAIDs.  All questions were answered to patient's satisfaction.  The patient knows to call the clinic with any problems questions or concerns.  Return of visit: 3 months with MD assessment and labs [facility], CMP,]  Earlie Server, MD, PhD. Hematology [place] at [facility] Pager- [phone] 09/02/2018

## 2018-10-08 ENCOUNTER — Ambulatory Visit: Payer: PRIVATE HEALTH INSURANCE | Admitting: Family

## 2018-10-08 NOTE — Progress Notes (Deleted)
Patient ID: Katherine Hernandez, female    DOB: 1930/04/05, 85 y.o.   MRN: [id number]  HPI  Katherine Hernandez is an 85 y/o female with a history of RLS, HTN, hyperlipidemia, GERD, diabetes, CAD, arthritis and chronic heart failure.   Echo report from 05/26/17 reviewed and showed an EF of 55-65% along with mild MR.   Has not been admitted or been in the ED in the last 6 months.   She presents today for a follow-up visit with a chief complaint of   Past Medical History:  Diagnosis Date  . Arthritis   . CAD (coronary artery disease)    Last nuclear 02/2009 EF 72% and neg for ischemia   . CHF (congestive heart failure) ([place])   . Coronary artery disease    CAD with PTCA(cfx)fx -1993,and CABG ,2004  . Diabetes mellitus without complication ([place])   . Diabetic nephropathy ([place])   . Diabetic peripheral neuropathy ([place])   . GERD (gastroesophageal reflux disease)   . Hyperlipidemia   . Hypertension   . Obesity   . Retinopathy, diabetic, background ([place])   . RLS (restless legs syndrome)   . Thrombocytopenia ([place])   . Urinary incontinence    Past Surgical History:  Procedure Laterality Date  . ABDOMINAL HYSTERECTOMY    . BONE MARROW BIOPSY    . BREAST BIOPSY Left 1970   surgical exc  . BREAST BIOPSY Right 09/13/2016   path pending x 2 areas  . CHOLECYSTECTOMY  1968  . COLONOSCOPY WITH PROPOFOL N/A 01/02/2017   Procedure: COLONOSCOPY WITH PROPOFOL;  Surgeon: Lollie Sails, MD;  Location: [facility] ENDOSCOPY;  Service: Endoscopy;  Laterality: N/A;  . CORONARY ANGIOPLASTY    . CORONARY ARTERY BYPASS GRAFT  02/2003  . ery    . ESOPHAGOGASTRODUODENOSCOPY (EGD) WITH PROPOFOL N/A 01/02/2017   Procedure: ESOPHAGOGASTRODUODENOSCOPY (EGD) WITH PROPOFOL;  Surgeon: Lollie Sails, MD;  Location: [facility] ENDOSCOPY;  Service: Endoscopy;  Laterality: N/A;   Family History  Problem Relation Age of Onset  . Breast cancer Sister 93  . Breast cancer Sister   . Diabetes Mother   . Heart disease Mother   .  Kidney disease Father   . Hypertension Father   . Fanconi anemia Sister    Social History   Tobacco Use  . Smoking status: Former Smoker    Packs/day: 1.00    Years: 15.00    Pack years: 15.00    Types: Cigarettes    Last attempt to quit: 07/17/1978    Years since quitting: 40.2  . Smokeless tobacco: Never Used  . Tobacco comment: quit in year 1980  Substance Use Topics  . Alcohol use: No   Allergies  Allergen Reactions  . Amoxicillin Diarrhea  . Cefdinir   . Doxycycline Diarrhea  . Eliquis [Apixaban] Other (See Comments)    Dizziness   . Erythromycin Diarrhea  . Lisinopril Cough  . Macrodantin [Nitrofurantoin] Diarrhea  . Mobic [Meloxicam]     diarrhea  . Statins Other (See Comments)    Joint pain  . Sulfa Antibiotics      Review of Systems  Constitutional: Positive for fatigue. Negative for appetite change.  HENT: Positive for rhinorrhea. Negative for congestion and sore throat.   Eyes: Negative.   Respiratory: Negative for cough, chest tightness and shortness of breath.   Cardiovascular: Positive for leg swelling. Negative for chest pain and palpitations.  Gastrointestinal: Negative for abdominal distention.  Endocrine: Negative.   Genitourinary: Negative.   Musculoskeletal:  Positive for back pain. Negative for neck pain.  Skin: Negative.   Allergic/Immunologic: Negative.   Neurological: Positive for light-headedness (if gets up too quickly). Negative for dizziness and headaches.  Hematological: Negative for adenopathy. Bruises/bleeds easily.  Psychiatric/Behavioral: Negative for dysphoric mood and sleep disturbance (wearing bipap nightly). The patient is not nervous/anxious.     Physical Exam  Constitutional: She is oriented to person, place, and time. She appears well-developed and well-nourished.  HENT:  Head: Normocephalic and atraumatic.  Neck: Normal range of motion. Neck supple. No JVD present.  Cardiovascular: Normal rate. An irregular rhythm  present.  Pulmonary/Chest: Effort normal. She has no wheezes. She has no rales.  Abdominal: Soft. She exhibits no distension. There is no abdominal tenderness.  Musculoskeletal:        General: Edema (1+ pitting edema in bilateral lower legs) present. No tenderness.  Neurological: She is alert and oriented to person, place, and time.  Skin: Skin is warm and dry.  Psychiatric: Her behavior is normal. Thought content normal. Her mood appears not anxious.  Nursing note and vitals reviewed.  Assessment & Plan:  1: Chronic heart failure with preserved ejection fraction- - NYHA class III - minimally fluid overloaded today - weighing daily; Reminded to call for an overnight weight gain of >2 pounds or a weekly weight gain of >5 pounds - weight  - not adding salt to her food and has been reading food labels for sodium content. Reminded to closely follow a 2072m sodium diet  - saw cardiologist (Ubaldo Glassing 01/29/18 - has been wearing bipap nightly and says that she's been sleeping better  2: HTN- - BP  - saw PCP (Hande) 10/23/17 - BMP from 07/23/18 reviewed and showed sodium 142, potassium 3.6, creatinine 1.46 and GFR 32  3: Diabetes-  - glucose has been "good" some days and "not so good other days" - saw endocrinologist ([place]road 01/08/18 - A1c on 07/23/2018 was 8.0%  4: Lymphedema- - stage 2 - has been elevating her legs more often but edema persists - had been wearing TED hose daily  - limited in exercise due to fatigue - has been wearing lymphapress compression boots with improvement in edema  Patient did not bring her medications nor a list. Each medication was verbally reviewed with the patient and she was encouraged to bring the bottles to every visit to confirm accuracy of list.

## 2018-10-09 ENCOUNTER — Ambulatory Visit: Payer: PRIVATE HEALTH INSURANCE | Admitting: Family

## 2018-11-19 ENCOUNTER — Telehealth: Payer: PRIVATE HEALTH INSURANCE | Admitting: Family

## 2018-12-03 ENCOUNTER — Inpatient Hospital Stay: Payer: Medicare Other | Admitting: Oncology

## 2018-12-03 ENCOUNTER — Inpatient Hospital Stay: Payer: Medicare Other

## 2018-12-17 ENCOUNTER — Ambulatory Visit: Payer: PRIVATE HEALTH INSURANCE | Admitting: Family

## 2018-12-24 ENCOUNTER — Ambulatory Visit: Payer: PRIVATE HEALTH INSURANCE | Admitting: Family

## 2019-02-03 ENCOUNTER — Telehealth: Payer: Self-pay | Admitting: *Deleted

## 2019-02-03 NOTE — Telephone Encounter (Signed)
Orders filled out, will get Dr. Tasia Catchings to sign and fax tomorrow morning. Thanks

## 2019-02-03 NOTE — Telephone Encounter (Signed)
I need cbc and cmp which can be done if she is getting labs done with pcp

## 2019-02-03 NOTE — Telephone Encounter (Signed)
Patient is having labs drawn for PCP and Aaron Edelman is asking if you want him to draw any labs for [place]. Patient has an appointment for lab/md on 02/11/19

## 2019-02-10 ENCOUNTER — Telehealth: Payer: Self-pay | Admitting: *Deleted

## 2019-02-10 ENCOUNTER — Encounter: Payer: Self-pay | Admitting: Oncology

## 2019-02-10 NOTE — Telephone Encounter (Signed)
Per pt daughter Mariann Laster) request to cancel 02/11/19 lab/MD appt. and will call back at a later date to R/S.Marland Kitchen

## 2019-02-11 ENCOUNTER — Inpatient Hospital Stay: Payer: Medicare Other | Admitting: Oncology

## 2019-02-11 ENCOUNTER — Inpatient Hospital Stay: Payer: Medicare Other

## 2019-02-18 ENCOUNTER — Ambulatory Visit: Payer: PRIVATE HEALTH INSURANCE | Admitting: Family

## 2019-06-13 ENCOUNTER — Telehealth: Payer: Self-pay | Admitting: Family

## 2019-06-13 NOTE — Telephone Encounter (Signed)
Virtual Visit Pre-Appointment Phone Call  " I am calling you today to discuss your upcoming appointment. We are currently trying to limit exposure to the virus that causes COVID-19 by seeing patients at home rather than in the office."  1. "What is the BEST phone number to call the day of the visit?" - include this in appointment notes  2. "Do you have or have access to (through a family member/friend) a smartphone with video capability that we can use for your visit?" a. If yes - list this number in appt notes as "cell" (if different from BEST phone #) and list the appointment type as a VIDEO visit in appointment notes b. If no - list the appointment type as a PHONE visit in appointment notes  3. Confirm consent - "In the setting of the current Covid19 crisis, you are scheduled for a (phone or video) visit with your provider on (date) at (time).  Just as we do with many in-office visits, in order for you to participate in this visit, we must obtain consent.  If you'd like, I can send this to your mychart (if signed up) or email for you to review.  Otherwise, I can obtain your verbal consent now.  All virtual visits are billed to your insurance company just like a normal visit would be.  By agreeing to a virtual visit, we'd like you to understand that the technology does not allow for your provider to perform an examination, and thus may limit your provider's ability to fully assess your condition. If your provider identifies any concerns that need to be evaluated in person, we will make arrangements to do so.  Finally, though the technology is pretty good, we cannot assure that it will always work on either your or our end, and in the setting of a video visit, we may have to convert it to a phone-only visit.  In either situation, we cannot ensure that we have a secure connection.  Are you willing to proceed?" STAFF: Did the patient verbally acknowledge consent to telehealth visit? Document YES/NO  here: YES  4. Advise patient to be prepared - "Two hours prior to your appointment, go ahead and check your blood pressure, pulse, oxygen saturation, and your weight (if you have the equipment to check those) and write them all down. When your visit starts, your provider will ask you for this information. If you have an Apple Watch or Kardia device, please plan to have heart rate information ready on the day of your appointment. Please have a pen and paper handy nearby the day of the visit as well."  5. Give patient instructions for MyChart download to smartphone OR Doximity/Doxy.me as below if video visit (depending on what platform provider is using)  6. Inform patient they will receive a phone call 15 minutes prior to their appointment time (may be from unknown caller ID) so they should be prepared to answer    [facility] has been deemed a candidate for a follow-up tele-health visit to limit community exposure during the Covid-19 pandemic. I spoke with the patient via phone to ensure availability of phone/video source, confirm preferred email & phone number, and discuss instructions and expectations.  I reminded Katherine Hernandez to be prepared with any vital sign and/or heart rhythm information that could potentially be obtained via home monitoring, at the time of her visit. I reminded Katherine Hernandez to expect a phone call prior to  her visit.  Alisa Graff, [place] 06/13/2019 7:25 PM   INSTRUCTIONS FOR DOWNLOADING THE MYCHART APP TO SMARTPHONE  - The patient must first make sure to have activated MyChart and know their login information - If Apple, go to [employer] and type in MyChart in the search bar and download the app. If Android, ask patient to go to [employer] and type in [place] in the search bar and download the app. The app is free but as with any other app downloads, their phone may require them to verify saved payment information or Apple/Android  password.  - The patient will need to then log into the app with their MyChart username and password, and select [facility] as their healthcare provider to link the account. When it is time for your visit, go to the MyChart app, find appointments, and click Begin Video Visit. Be sure to Select Allow for your device to access the Microphone and Camera for your visit. You will then be connected, and your provider will be with you shortly.  **If they have any issues connecting, or need assistance please contact MyChart service desk [phone])**  **If using a computer, in order to ensure the best quality for their visit they will need to use either of the following Internet Browsers: [employer], or Google Chrome**  IF USING DOXIMITY or DOXY.ME - The patient will receive a link just prior to their visit by text.     FULL LENGTH CONSENT FOR TELE-HEALTH VISIT   I hereby voluntarily request, consent and authorize [facility] and its employed or contracted physicians, physician assistants, nurse practitioners or other licensed health care professionals (the Practitioner), to provide me with telemedicine health care services (the "Services") as deemed necessary by the treating Practitioner. I acknowledge and consent to receive the Services by the Practitioner via telemedicine. I understand that the telemedicine visit will involve communicating with the Practitioner through live audiovisual communication technology and the disclosure of certain medical information by electronic transmission. I acknowledge that I have been given the opportunity to request an in-person assessment or other available alternative prior to the telemedicine visit and am voluntarily participating in the telemedicine visit.  I understand that I have the right to withhold or withdraw my consent to the use of telemedicine in the course of my care at any time, without affecting my right to future care or treatment, and  that the Practitioner or I may terminate the telemedicine visit at any time. I understand that I have the right to inspect all information obtained and/or recorded in the course of the telemedicine visit and may receive copies of available information for a reasonable fee.  I understand that some of the potential risks of receiving the Services via telemedicine include:  Marland Kitchen Delay or interruption in medical evaluation due to technological equipment failure or disruption; . Information transmitted may not be sufficient (e.g. poor resolution of images) to allow for appropriate medical decision making by the Practitioner; and/or  . In rare instances, security protocols could fail, causing a breach of personal health information.  Furthermore, I acknowledge that it is my responsibility to provide information about my medical history, conditions and care that is complete and accurate to the best of my ability. I acknowledge that Practitioner's advice, recommendations, and/or decision may be based on factors not within their control, such as incomplete or inaccurate data provided by me or distortions of diagnostic images or specimens that may result from electronic transmissions.  I understand that the practice of medicine is not an exact science and that Practitioner makes no warranties or guarantees regarding treatment outcomes. I acknowledge that I will receive a copy of this consent concurrently upon execution via email to the email address I last provided but may also request a printed copy by calling the office of [facility]    I understand that my insurance will be billed for this visit.   I have read or had this consent read to me. . I understand the contents of this consent, which adequately explains the benefits and risks of the Services being provided via telemedicine.  . I have been provided ample opportunity to ask questions regarding this consent and the Services and have had my  questions answered to my satisfaction. . I give my informed consent for the services to be provided through the use of telemedicine in my medical care  By participating in this telemedicine visit I agree to the above.

## 2019-06-16 ENCOUNTER — Encounter: Payer: Self-pay | Admitting: Family

## 2019-06-16 ENCOUNTER — Ambulatory Visit: Payer: Medicare Other | Attending: Family | Admitting: Family

## 2019-06-16 ENCOUNTER — Other Ambulatory Visit: Payer: Self-pay

## 2019-06-16 VITALS — Wt 165.0 lb

## 2019-06-16 DIAGNOSIS — I10 Essential (primary) hypertension: Secondary | ICD-10-CM

## 2019-06-16 DIAGNOSIS — Z794 Long term (current) use of insulin: Secondary | ICD-10-CM

## 2019-06-16 DIAGNOSIS — I5032 Chronic diastolic (congestive) heart failure: Secondary | ICD-10-CM

## 2019-06-16 NOTE — Patient Instructions (Signed)
Continue weighing daily and call for an overnight weight gain of > 2 pounds or a weekly weight gain of >5 pounds.  Call us in 6 months if you'd like to make another appointment. You can also call us sooner if you have any issues or concerns.

## 2019-06-16 NOTE — Progress Notes (Signed)
Virtual Visit via Telephone Note    Evaluation Performed:  Follow-up visit  This visit type was conducted due to national recommendations for restrictions regarding the COVID-19 Pandemic (e.g. social distancing).  This format is felt to be most appropriate for this patient at this time.  All issues noted in this document were discussed and addressed.  No physical exam was performed (except for noted visual exam findings with Video Visits).  Please refer to the patient's chart (MyChart message for video visits and phone note for telephone visits) for the patient's consent to telehealth for [facility]  Date:  35/57/3220   ID:  Katherine Hernandez, DOB [birth year], MRN [id number]  Patient Location:  [address] [place] [address]   Provider location:   [facility] [address], [place] [address]  PCP:  Tracie Harrier, MD  Cardiologist:  Bartholome Bill, MD Electrophysiologist:  None   Chief Complaint:  Shortness of breath  History of Present Illness:    Katherine Hernandez is a 85 y.o. female who presents via audio/video conferencing for a telehealth visit today.  Patient verified DOB and address.  The patient does not have symptoms concerning for COVID-19 infection (fever, chills, cough, or new SHORTNESS OF BREATH).   She describes her shortness of breath as minimal and as having been present for several years. She has associated fatigue, dry cough, intermittent palpitations, intermittent leg swelling and light-headedness with sudden position changes along with this. She denies any chest pain, difficulty sleeping or weight gain. She is wearing her CPAP every night.   She says that she hasn't been out anywhere since COVID started and has become a recluse trying to stay healthy and not expose herself to the virus.   Prior CV studies:   The following studies were reviewed today:  Echo report from 05/26/17 reviewed and showed an EF of  55-65% along with mild MR.   Past Medical History:  Diagnosis Date  . Arthritis   . CAD (coronary artery disease)    Last nuclear 02/2009 EF 72% and neg for ischemia   . CHF (congestive heart failure) ([place])   . Coronary artery disease    CAD with PTCA(cfx)fx -1993,and CABG ,2004  . Diabetes mellitus without complication ([place])   . Diabetic nephropathy ([place])   . Diabetic peripheral neuropathy ([place])   . GERD (gastroesophageal reflux disease)   . Hyperlipidemia   . Hypertension   . Obesity   . Retinopathy, diabetic, background ([place])   . RLS (restless legs syndrome)   . Thrombocytopenia ([place])   . Urinary incontinence    Past Surgical History:  Procedure Laterality Date  . ABDOMINAL HYSTERECTOMY    . BONE MARROW BIOPSY    . BREAST BIOPSY Left 1970   surgical exc  . BREAST BIOPSY Right 09/13/2016   path pending x 2 areas  . CHOLECYSTECTOMY  1968  . COLONOSCOPY WITH PROPOFOL N/A 01/02/2017   Procedure: COLONOSCOPY WITH PROPOFOL;  Surgeon: Lollie Sails, MD;  Location[facility] ENDOSCOPY;  Service: Endoscopy;  Laterality: N/A;  . CORONARY ANGIOPLASTY    . CORONARY ARTERY BYPASS GRAFT  02/2003  . ery    . ESOPHAGOGASTRODUODENOSCOPY (EGD) WITH PROPOFOL N/A 01/02/2017   Procedure: ESOPHAGOGASTRODUODENOSCOPY (EGD) WITH PROPOFOL;  Surgeon: Lollie Sails, MD;  Location: [facility] ENDOSCOPY;  Service: Endoscopy;  Laterality: N/A;     Prior to Admission medications   Medication Sig Start Date End Date Taking? Authorizing Provider  albuterol (PROVENTIL HFA;VENTOLIN HFA) 108 (90 Base) MCG/ACT inhaler Inhale 2 puffs every 6 (six) hours as needed into the lungs for wheezing or shortness of breath.   Yes [provider]  allopurinol (ZYLOPRIM) 100 MG tablet Take 200 mg by mouth daily.  08/06/17  Yes [provider]  cetirizine (ZYRTEC) 10 MG tablet Take 10 mg by mouth daily.   Yes [provider]  Cyanocobalamin (B-12 COMPLIANCE INJECTION IJ) Inject as directed every 30  (thirty) days.   Yes [provider]  ergocalciferol (VITAMIN D2) 50000 units capsule Take 50,000 Units by mouth once a week.   Yes [provider]  estradiol (ESTRACE) 0.1 MG/GM vaginal cream Place 1 Applicatorful vaginally 2 (two) times a week. Pt takes 2-3 times a week   Yes [provider]  Fluocinolone Acetonide Scalp 0.01 % OIL  11/13/17  Yes [provider]  furosemide (LASIX) 20 MG tablet Take 80 mg by mouth daily.    Yes [provider]  gabapentin (NEURONTIN) 300 MG capsule Take 300 mg by mouth 3 (three) times daily. Pt doesn't always take 3 times a day   Yes [provider]  Insulin Glargine (LANTUS SOLOSTAR) 100 UNIT/ML Solostar Pen Inject 48 Units into the skin at bedtime.    Yes [provider]  liraglutide (VICTOZA) 18 MG/3ML SOPN Inject 1.8 mg into the skin.   Yes [provider]  LORazepam (ATIVAN) 0.5 MG tablet Take 0.5 mg by mouth every 8 (eight) hours as needed.    Yes [provider]  losartan (COZAAR) 100 MG tablet Take 100 mg by mouth daily.   Yes [provider]  metoprolol tartrate (LOPRESSOR) 25 MG tablet Take 25 mg by mouth 2 (two) times daily.   Yes [provider]  nitroGLYCERIN (NITROSTAT) 0.4 MG SL tablet Place 0.4 mg under the tongue every 5 (five) minutes as needed for chest pain.   Yes [provider]  pantoprazole (PROTONIX) 40 MG tablet Take 40 mg by mouth daily.    Yes [provider]  rOPINIRole (REQUIP) 0.5 MG tablet Take 0.5 mg by mouth at bedtime.   Yes [provider]  sertraline (ZOLOFT) 50 MG tablet Take 50 mg by mouth daily.  10/23/17 06/16/19 Yes [provider]  traMADol (ULTRAM) 50 MG tablet Take 50 mg by mouth every 12 (twelve) hours as needed.  01/09/18  Yes [provider]     Allergies:   Amoxicillin, Cefdinir, Doxycycline, Eliquis [apixaban], Erythromycin, Lisinopril, Macrodantin [nitrofurantoin], Mobic  [meloxicam], Statins, and Sulfa antibiotics   Social History   Tobacco Use  . Smoking status: Former Smoker    Packs/day: 1.00    Years: 15.00    Pack years: 15.00    Types: Cigarettes    Quit date: 07/17/1978    Years since quitting: 40.9  . Smokeless tobacco: Never Used  . Tobacco comment: quit in year 1980  Substance Use Topics  . Alcohol use: No  . Drug use: No     Family Hx: The patient's family history includes Breast cancer in her sister; Breast cancer (age of onset: 63) in her sister; Diabetes in her mother; Fanconi anemia in her sister; Heart disease in her mother; Hypertension in her father; Kidney disease in her father.  ROS:   Please see the history of present illness.     All other systems reviewed and are negative.   Labs/Other Tests and Data Reviewed:    Recent Labs: 09/02/2018: Hemoglobin 11.2;  Platelets 81   Recent Lipid Panel No results found for: CHOL, TRIG, HDL, CHOLHDL, LDLCALC, LDLDIRECT  Wt Readings from Last 3 Encounters:  09/02/18 172 lb 14.4 oz (78.4 kg)  06/03/18 172 lb 1.6 oz (78.1 kg)  04/09/18 175 lb 9.6 oz (79.7 kg)     Exam:    Vital Signs:  There were no vitals taken for this visit.   Well nourished, well developed female in no  acute distress.   ASSESSMENT & PLAN:    1. Chronic heart failure with preserved ejection fraction- - NYHA class II - euvolemic per patient's description of symptoms - weighing daily; Reminded to call for an overnight weight gain of >2 pounds or a weekly weight gain of >5 pounds - not adding salt to her food and has been reading food labels for sodium content. Reminded to closely follow a 2038m sodium diet  - saw cardiologist (Ubaldo Glassing 01/29/18 - has been wearing CPAP nightly - reports receiving her flu vaccine for this season - currently receiving PT at home  2: HTN- - not checking her BP at home - saw PCP ([facility]) 10/23/17 & had telemedicine visit with him 06/10/2019 - BMP from 07/23/2018 reviewed and  showed sodium 142, creatinine 1.2,  potassium 3.6 and GFR 43  3: Diabetes-  - has been started on victoza; home glucose this morning was 179 - [place]ocrinologist ([place] 01/08/18 - A1c on 07/23/2018 was 8.0%  COVID-19 Education: The signs and symptoms of COVID-19 were discussed with the patient and how to seek care for testing (follow up with PCP or arrange E-visit).  The importance of social distancing was discussed today.  Patient Risk:   After full review of this patients clinical status, I feel that they are at least moderate risk at this time.  Time:   Today, I have spent 15 minutes with the patient with telehealth technology discussing medications, daily weight and symptoms to report.   Medication Adjustments/Labs and Tests Ordered: Current medicines are reviewed at length with the patient today.  Concerns regarding medicines are outlined above.   Tests Ordered: No orders of the defined types were placed in this encounter.  Medication Changes: No orders of the defined types were placed in this encounter.   Disposition:  Follow-up as needed in 6 months or sooner for any questions/problems before then.   Signed, TAlisa Graff FNP  06/16/2019 12:02 PM    [facility]

## 2019-09-24 IMAGING — MR MR LUMBAR SPINE W/O CM
4 of 5 series · 23 of 48 positions shown · non-contrast
Comparison: Abdomen radiographs and small-bowel follow-through
02/14/2017.

CLINICAL DATA: 87-year-old female with lumbar back pain radiating
to the right leg with weakness for several years. Progressive
symptoms for several months.

EXAM:
MRI LUMBAR SPINE WITHOUT CONTRAST
TECHNIQUE: Multiplanar, multisequence MR imaging of the lumbar spine was
performed. No intravenous contrast was administered.

[Series 2: T2 · sagittal · 4.0mm · 0.81mm/px · 6 of 19 slices shown (1 of 2)]
[im 1/19]
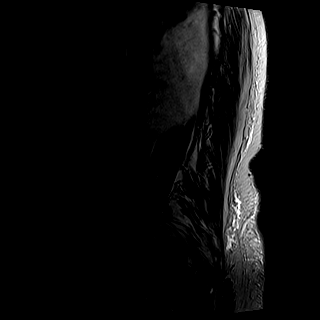
[im 4/19]
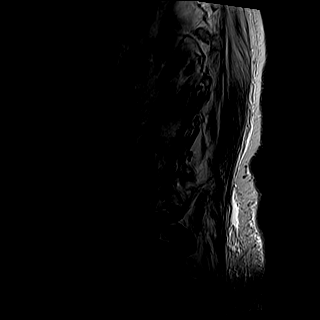
[im 8/19]
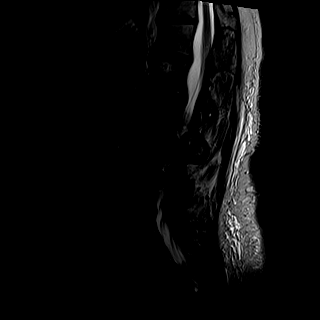
[im 11/19]
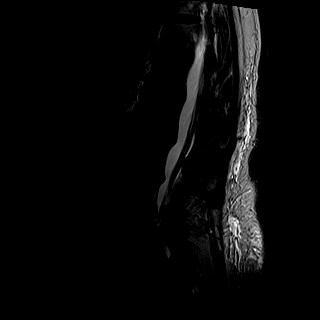
[im 15/19]
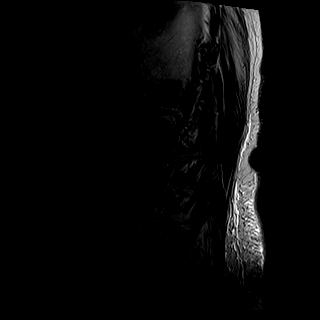
[im 19/19]
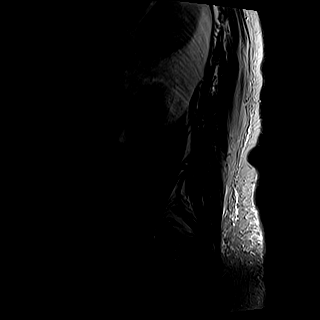

[Series 3: T1 · sagittal · 4.0mm · 0.41mm/px · 6 of 19 slices shown (1 of 2)]
[im 1/19]
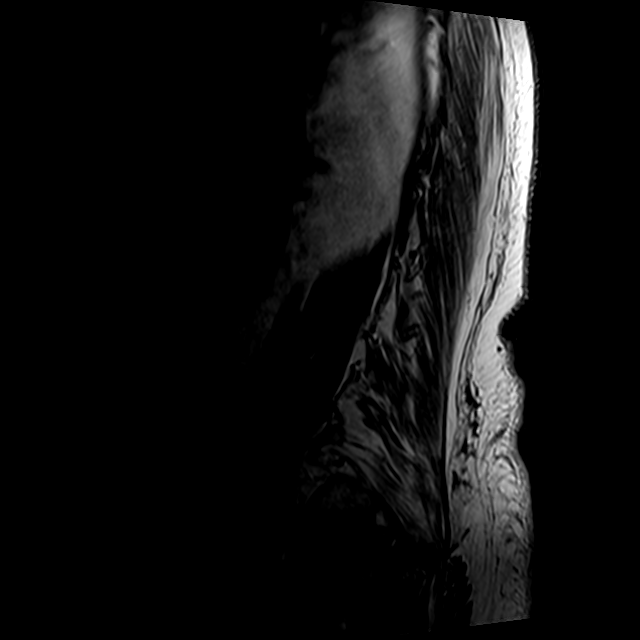
[im 4/19]
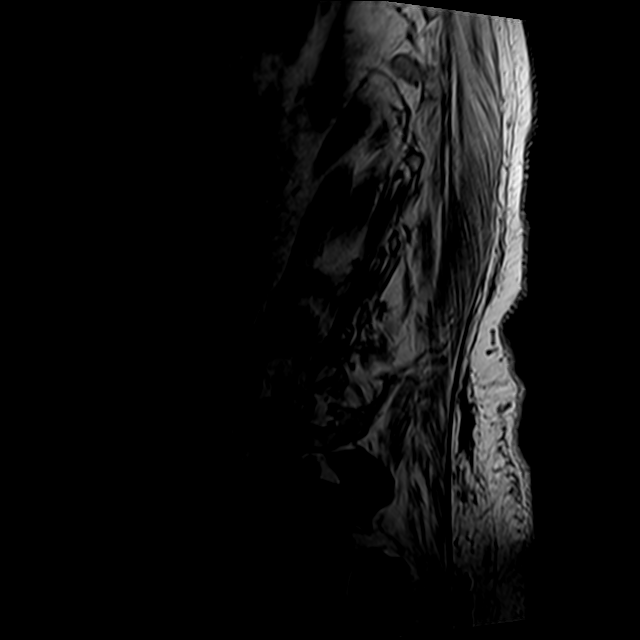
[im 7/19]
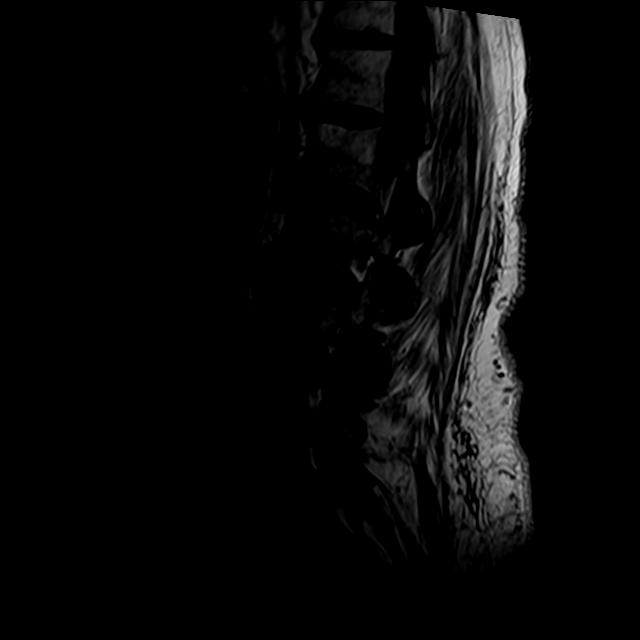
[im 10/19]
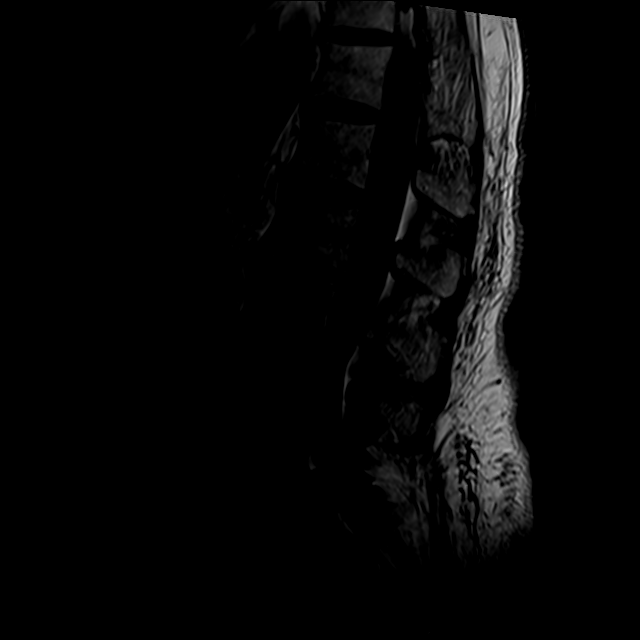
[im 13/19]
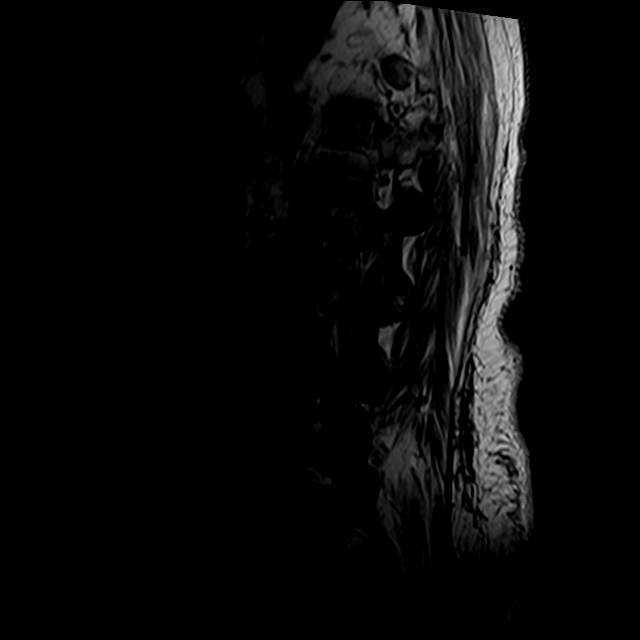
[im 16/19]
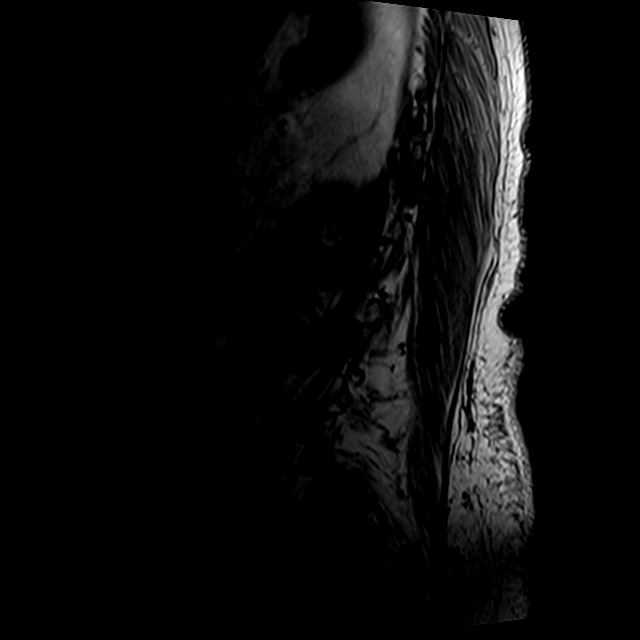

[Series 5: T2 · axial · 4.0mm · 0.78mm/px · z∈[-114,+103]mm · 8 of 39 slices shown (2 of 2)]
[im 1/39]
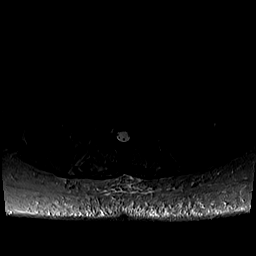
[im 6/39]
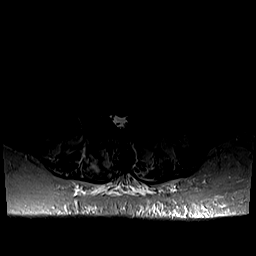
[im 12/39]
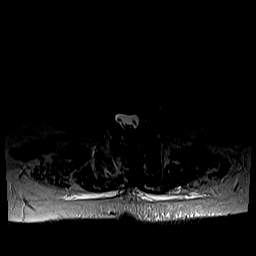
[im 18/39]
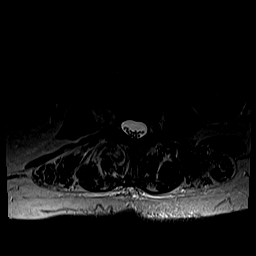
[im 21/39]
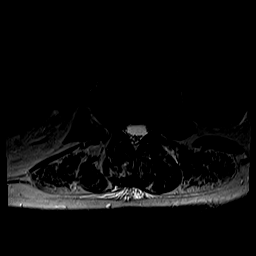
[im 27/39]
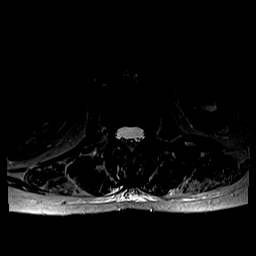
[im 33/39]
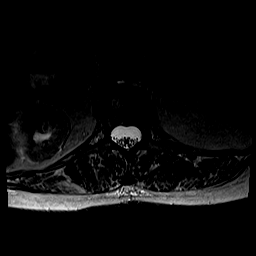
[im 39/39]
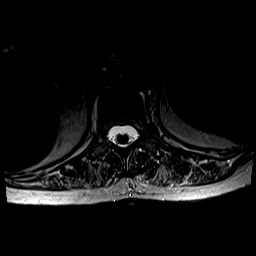

[Series 6: T1 · axial · 4.0mm · 0.39mm/px · z∈[-89,+73]mm · 3 of 39 slices shown (2 of 2)]
[im 6/39]
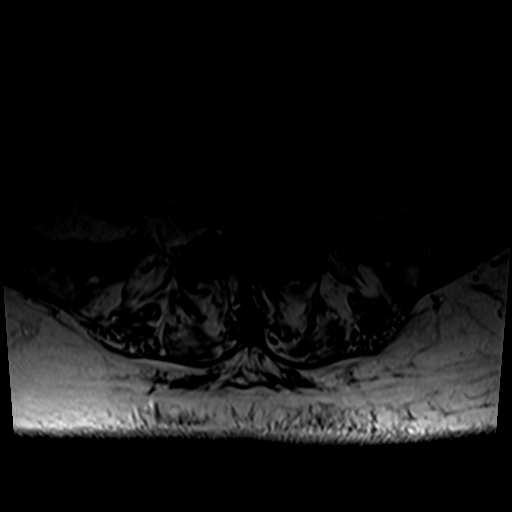
[im 21/39]
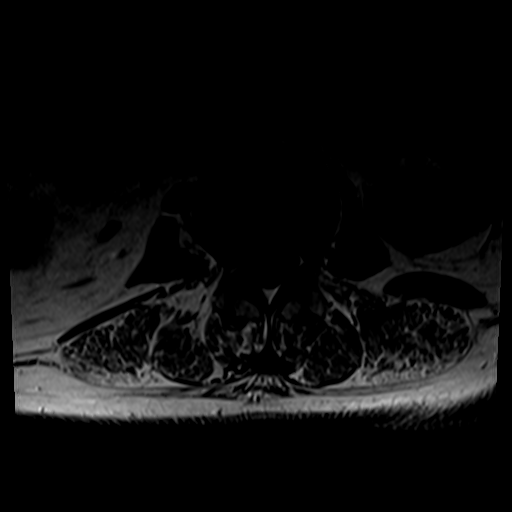
[im 33/39]
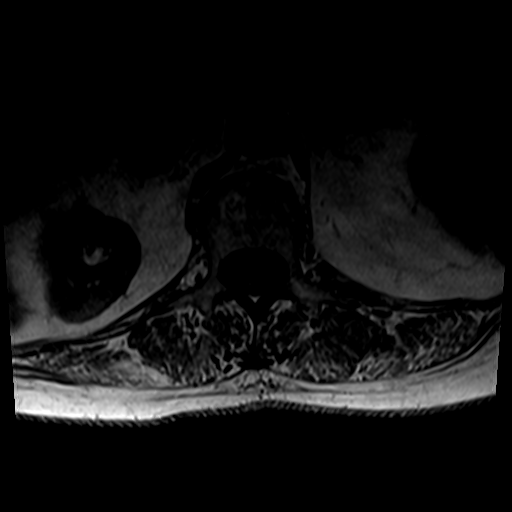

[23 of 48 positions shown; findings below may reference images not displayed]

FINDINGS: Segmentation:  Normal on the comparison radiographs.

Alignment: Chronic mild levoconvex lumbar scoliosis. Subtle
anterolisthesis of L5 on S1. Similar mild retrolisthesis of L2 on L3
and L3 on L4. Overall mild straightening of lumbar lordosis.

Vertebrae: Confluent right far lateral endplate marrow edema at
L3-L4. This appears degenerative in nature, and accompanies of far
lateral disc protrusion (series 4, image 5). Outside of the
protrusion there is no abnormal increased T2 or STIR signal within
the disc.

No other No marrow edema or evidence of acute osseous abnormality.
Intact visible sacrum and SI joints. Background bone marrow signal
appears normal.

Conus medullaris and cauda equina: Conus extends to the T12-L1
level. Conus and cauda equina appear normal.

Paraspinal and other soft tissues: Confluent edema in the medial
right psoas muscle about the L3-L4 disc space (series 4, image 4 and
series 5, image 24. No other adjacent paraspinal or epidural space
inflammation at that level. No intramuscular fluid collection.

Duodenum diverticulum or air-fluid level within the colon in the
right upper quadrant on series 5, image 9. Otherwise negative for
age visible abdominal viscera.

There is patchy nonspecific appearing right lumbar erector spinae
muscle edema (series 4, image 5).

Disc levels:

T11-T12: Minimal disc bulge.

T12-L1:  Negative.

L1-L2:  Mild facet hypertrophy.

L2-L3: Disc desiccation and disc space loss with mild
retrolisthesis. Right eccentric circumferential disc bulge. Mild
facet and ligament flavum hypertrophy. No spinal or lateral recess
stenosis. Borderline to mild L2 foraminal stenosis.

L3-L4: Disc desiccation and disc space loss. Circumferential disc
bulge. Superimposed right far lateral disc protrusion as stated
above. Smaller left subarticular disc protrusion. Mild to moderate
facet and ligament flavum hypertrophy. Mild spinal stenosis. Mild
bilateral lateral recess stenosis (L4 nerve levels). Mild bilateral
L3 foraminal stenosis.

L4-L5: Disc desiccation with circumferential disc bulge eccentric to
the left. Mild to moderate facet and ligament flavum hypertrophy. No
significant spinal stenosis. Mild left lateral recess stenosis (left
L5 nerve level), and mild to moderate left L4 foraminal stenosis.

L5-S1: Circumferential disc bulge with superimposed small central
disc protrusion (series 5, image 34). Moderate facet hypertrophy
greater on the left. No spinal or convincing lateral recess
stenosis. Mild left L5 foraminal stenosis.
IMPRESSION: 1. Abnormal signal in the right lateral L3-L4 endplates with
adjacent medial right psoas muscle inflammation appears related to a
degenerative right far lateral L3-L4 disc herniation. Discitis was
considered, but felt unlikely. Query right L3 radiculitis. There is
otherwise mild multifactorial spinal and bilateral lateral recess
and foraminal stenosis at that level.
2. Other lumbar spine degeneration with no acute osseous abnormality
or high-grade neural impingement. There is up to moderate left
neural foraminal stenosis at L4-L5.
3. Patchy right lumbar erector spinae muscle edema. This is
nonspecific but might reflect altered biomechanics related to #1.

## 2019-10-22 ENCOUNTER — Telehealth: Payer: Self-pay

## 2019-10-22 NOTE — Telephone Encounter (Signed)
Please cancel lab appointment for tomorrow (will need MD only appt).  There are labs scanned in chart drawn on 10/07/19 by PCP.

## 2019-10-22 NOTE — Telephone Encounter (Signed)
Done...  Lab appt has been cancelled as requested.

## 2019-10-23 ENCOUNTER — Inpatient Hospital Stay: Payer: Medicare Other

## 2019-10-23 ENCOUNTER — Other Ambulatory Visit: Payer: Self-pay

## 2019-10-23 ENCOUNTER — Inpatient Hospital Stay: Payer: Medicare Other | Attending: Oncology | Admitting: Oncology

## 2019-10-23 ENCOUNTER — Encounter: Payer: Self-pay | Admitting: Oncology

## 2019-10-23 VITALS — BP 192/78 | HR 70 | Temp 96.9°F | Resp 18 | Wt 174.4 lb

## 2019-10-23 DIAGNOSIS — Z90710 Acquired absence of both cervix and uterus: Secondary | ICD-10-CM | POA: Diagnosis not present

## 2019-10-23 DIAGNOSIS — I2510 Atherosclerotic heart disease of native coronary artery without angina pectoris: Secondary | ICD-10-CM | POA: Insufficient documentation

## 2019-10-23 DIAGNOSIS — Z79899 Other long term (current) drug therapy: Secondary | ICD-10-CM | POA: Insufficient documentation

## 2019-10-23 DIAGNOSIS — D696 Thrombocytopenia, unspecified: Secondary | ICD-10-CM

## 2019-10-23 DIAGNOSIS — E785 Hyperlipidemia, unspecified: Secondary | ICD-10-CM | POA: Diagnosis not present

## 2019-10-23 DIAGNOSIS — D469 Myelodysplastic syndrome, unspecified: Secondary | ICD-10-CM | POA: Diagnosis not present

## 2019-10-23 DIAGNOSIS — Z794 Long term (current) use of insulin: Secondary | ICD-10-CM | POA: Insufficient documentation

## 2019-10-23 DIAGNOSIS — K219 Gastro-esophageal reflux disease without esophagitis: Secondary | ICD-10-CM | POA: Insufficient documentation

## 2019-10-23 DIAGNOSIS — E1121 Type 2 diabetes mellitus with diabetic nephropathy: Secondary | ICD-10-CM | POA: Diagnosis not present

## 2019-10-23 DIAGNOSIS — Z803 Family history of malignant neoplasm of breast: Secondary | ICD-10-CM | POA: Insufficient documentation

## 2019-10-23 DIAGNOSIS — Z833 Family history of diabetes mellitus: Secondary | ICD-10-CM | POA: Insufficient documentation

## 2019-10-23 DIAGNOSIS — D649 Anemia, unspecified: Secondary | ICD-10-CM

## 2019-10-23 DIAGNOSIS — Z8249 Family history of ischemic heart disease and other diseases of the circulatory system: Secondary | ICD-10-CM | POA: Insufficient documentation

## 2019-10-23 DIAGNOSIS — I110 Hypertensive heart disease with heart failure: Secondary | ICD-10-CM | POA: Insufficient documentation

## 2019-10-23 DIAGNOSIS — E11319 Type 2 diabetes mellitus with unspecified diabetic retinopathy without macular edema: Secondary | ICD-10-CM | POA: Diagnosis not present

## 2019-10-23 DIAGNOSIS — D509 Iron deficiency anemia, unspecified: Secondary | ICD-10-CM | POA: Diagnosis not present

## 2019-10-23 DIAGNOSIS — Z87891 Personal history of nicotine dependence: Secondary | ICD-10-CM | POA: Diagnosis not present

## 2019-10-23 DIAGNOSIS — E669 Obesity, unspecified: Secondary | ICD-10-CM | POA: Diagnosis not present

## 2019-10-23 DIAGNOSIS — I509 Heart failure, unspecified: Secondary | ICD-10-CM | POA: Diagnosis not present

## 2019-10-23 LAB — CBC WITH DIFFERENTIAL/PLATELET
Abs Immature Granulocytes: 0.02 10*3/uL (ref 0.00–0.07)
Basophils Absolute: 0 10*3/uL (ref 0.0–0.1)
Basophils Relative: 1 %
Eosinophils Absolute: 0.1 10*3/uL (ref 0.0–0.5)
Eosinophils Relative: 2 %
HCT: 32 % — ABNORMAL LOW (ref 36.0–46.0)
Hemoglobin: 10.5 g/dL — ABNORMAL LOW (ref 12.0–15.0)
Immature Granulocytes: 0 %
Lymphocytes Relative: 26 %
Lymphs Abs: 1.4 10*3/uL (ref 0.7–4.0)
MCH: 31.3 pg (ref 26.0–34.0)
MCHC: 32.8 g/dL (ref 30.0–36.0)
MCV: 95.2 fL (ref 80.0–100.0)
Monocytes Absolute: 0.4 10*3/uL (ref 0.1–1.0)
Monocytes Relative: 7 %
Neutro Abs: 3.6 10*3/uL (ref 1.7–7.7)
Neutrophils Relative %: 64 %
Platelets: 81 10*3/uL — ABNORMAL LOW (ref 150–400)
RBC: 3.36 MIL/uL — ABNORMAL LOW (ref 3.87–5.11)
RDW: 14.6 % (ref 11.5–15.5)
WBC: 5.6 10*3/uL (ref 4.0–10.5)
nRBC: 0 % (ref 0.0–0.2)

## 2019-10-23 LAB — TECHNOLOGIST SMEAR REVIEW: Plt Morphology: DECREASED

## 2019-10-23 LAB — FERRITIN: Ferritin: 200 ng/mL (ref 11–307)

## 2019-10-23 LAB — LACTATE DEHYDROGENASE: LDH: 161 U/L (ref 98–192)

## 2019-10-23 LAB — IRON AND TIBC
Iron: 63 ug/dL (ref 28–170)
Saturation Ratios: 20 % (ref 10.4–31.8)
TIBC: 315 ug/dL (ref 250–450)
UIBC: 252 ug/dL

## 2019-10-23 NOTE — Progress Notes (Signed)
Patient does not offer any problems today.  

## 2019-10-23 NOTE — Progress Notes (Signed)
Hematology/Oncology  Follow up note [facility] [phone] Fax:([phone]   Patient Care Team: Tracie Harrier, MD as PCP - General (Internal Medicine) Alisa Graff, FNP as Nurse Practitioner (Family Medicine) Ubaldo Glassing Javier Docker, MD as Consulting Physician (Cardiology) Solum, Betsey Holiday, MD as Physician Assistant (Endocrinology)  REFERRING PROVIDER: [facility].  CHIEF COMPLAINTS/PURPOSE OF CONSULTATION:  Evaluation of thrombocytopenia  HISTORY OF PRESENTING ILLNESS:  Katherine Hernandez is a  86 y.o.  female with PMH listed below who was referred to me for evaluation of thrombocytopenia.  Patient was seen by [facility] for iron deficiency anemia/epigastric pain/GERD.  Patient had a workup for GI bleed with EGD/colonoscopy.   She has to taken iron supplements twice a day.  Was noted that on her CBC she has thrombocytopenia.  And referred to [place] for further evaluation.  Patient denies having any acute bleeding.  She has easy bruising. Reviewed patient's lab work at [facility].  Her hemoglobin is 11.3, platelet counts 72,000, normal differential.  Her thrombocytopenia can trace back to September 2015, at that time her counts was 95,000. Patient reports mild fatigue otherwise feeling well.  Good appetite no weight loss.  No fever chills, night sweats.  For her thrombocytopenia,US abdomen showed no hepatosplenomegaly.  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 Normal cytogenetics. MDS IPSS score 1.5, very low risk.    INTERVAL HISTORY [name] is a 86 y.o. female who has above history reviewed by me today presents for follow up visit for management of thrombocytopenia-low-grade MDS Patient lost follow-up and was last seen by me  in February 2020. Patient reports feeling more fatigued and tired recently. Appetite is fair. Weight has been stable. +easy bruising denies acute bleeding events. She was accompanied by her daughter   Review of Systems  Constitutional: Positive for malaise/fatigue. Negative for chills, fever and weight loss.  HENT: Negative for sore throat.   Eyes: Negative for redness.  Respiratory: Negative for cough, shortness of breath and wheezing.   Cardiovascular: Negative for chest pain, palpitations and leg swelling.  Gastrointestinal: Negative for abdominal pain, blood in stool, nausea and vomiting.  Genitourinary: Negative for dysuria.  Musculoskeletal: Negative for myalgias.  Skin: Negative for rash.  Neurological: Negative for dizziness, tingling and tremors.  Endo/Heme/Allergies: Does not bruise/bleed easily.  Psychiatric/Behavioral: Negative for hallucinations.    MEDICAL HISTORY:  Past Medical History:  Diagnosis Date  . Arthritis   . CAD (coronary artery disease)    Last nuclear 02/2009 EF 72% and neg for ischemia   . CHF (congestive heart failure) ([place])   . Coronary artery disease    CAD with PTCA(cfx)fx -1993,and CABG ,2004  . Diabetes mellitus without complication ([place])   . Diabetic nephropathy ([place])   . Diabetic peripheral neuropathy ([place])   . GERD (gastroesophageal reflux disease)   . Hyperlipidemia   . Hypertension   . Obesity   . Retinopathy, diabetic, background ([place])   . RLS (restless legs syndrome)   . Thrombocytopenia ([place])   . Urinary incontinence     SURGICAL HISTORY: Past Surgical History:  Procedure Laterality Date  . ABDOMINAL HYSTERECTOMY    . BONE MARROW BIOPSY    . BREAST BIOPSY Left 1970   surgical exc  . BREAST BIOPSY  Right 09/13/2016   path pending x 2 areas  . CHOLECYSTECTOMY  1968  . COLONOSCOPY WITH PROPOFOL N/A 01/02/2017   Procedure: COLONOSCOPY WITH PROPOFOL;  Surgeon: Lollie Sails, MD;  Location: [facility] ENDOSCOPY;  Service:  Endoscopy;  Laterality: N/A;  . CORONARY ANGIOPLASTY    . CORONARY ARTERY BYPASS GRAFT  02/2003  . ery    . ESOPHAGOGASTRODUODENOSCOPY (EGD) WITH PROPOFOL N/A 01/02/2017   Procedure: ESOPHAGOGASTRODUODENOSCOPY (EGD) WITH PROPOFOL;  Surgeon: Lollie Sails, MD;  Location: [facility] ENDOSCOPY;  Service: Endoscopy;  Laterality: N/A;    SOCIAL HISTORY: Social History   Socioeconomic History  . Marital status: Widowed    Spouse name: Not on file  . Number of children: 3  . Years of education: 8  . Highest education level: 8th grade  Occupational History  . Occupation: retired  Tobacco Use  . Smoking status: Former Smoker    Packs/day: 1.00    Years: 15.00    Pack years: 15.00    Types: Cigarettes    Quit date: 07/17/1978    Years since quitting: 41.2  . Smokeless tobacco: Never Used  . Tobacco comment: quit in year 1980  Substance and Sexual Activity  . Alcohol use: No  . Drug use: No  . Sexual activity: Never  Other Topics Concern  . Not on file  Social History Narrative   History of smoking cigarettes: Former smoker   Quit in year : 1980   Pack-year Hx: 40   No alcohol.   Caffeine :yes    Diet : no   Exercise :no   Occupation : unemployed, retired    No Psychologist, educational status: single,widowed   LV systolic Dysfunction   EF less than 40 >no   Social Determinants of Radio broadcast assistant Strain:   . Difficulty of Paying Living Expenses:   Food Insecurity:   . Worried About Charity fundraiser in the Last Year:   . Arboriculturist in the Last Year:   Transportation Needs:   . Film/video editor (Medical):   Marland Kitchen Lack of Transportation (Non-Medical):   Physical Activity:   . Days of Exercise per Week:   . Minutes of Exercise per Session:   Stress:   . Feeling of Stress :   Social Connections:   . Frequency of Communication with Friends and Family:   . Frequency of Social Gatherings with Friends and Family:   . Attends Religious Services:   . Active  Member of Clubs or Organizations:   . Attends Archivist Meetings:   Marland Kitchen Marital Status:   Intimate Partner Violence:   . Fear of Current or Ex-Partner:   . Emotionally Abused:   Marland Kitchen Physically Abused:   . Sexually Abused:     FAMILY HISTORY: Family History  Problem Relation Age of Onset  . Breast cancer Sister 34  . Breast cancer Sister   . Diabetes Mother   . Heart disease Mother   . Kidney disease Father   . Hypertension Father   . Fanconi anemia Sister     ALLERGIES:  is allergic to amoxicillin; cefdinir; doxycycline; eliquis [apixaban]; erythromycin; lisinopril; macrodantin [nitrofurantoin]; mobic [meloxicam]; statins; and sulfa antibiotics.  MEDICATIONS:  Current Outpatient Medications  Medication Sig Dispense Refill  . albuterol (PROVENTIL HFA;VENTOLIN HFA) 108 (90 Base) MCG/ACT inhaler Inhale 2 puffs every 6 (six) hours as needed into the lungs for wheezing or shortness of breath.    . allopurinol (ZYLOPRIM)  100 MG tablet Take 200 mg by mouth daily.     . cetirizine (ZYRTEC) 10 MG tablet Take 10 mg by mouth daily.    . colchicine 0.6 MG tablet Take 2 tablets (1.641m) by mouth at first sign of gout flare followed by 1 tablet (0.6441m after 1 hour. (Max 1.41m63mithin 1 hour)    . conjugated estrogens (PREMARIN) vaginal cream INSERT 0.5 GRAMS VAGINALLY  TWICE WEEKLY    . Cyanocobalamin (B-12 COMPLIANCE INJECTION IJ) Inject as directed every 30 (thirty) days.    . ergocalciferol (VITAMIN D2) 50000 units capsule Take 50,000 Units by mouth once a week.    . estradiol (ESTRACE) 0.1 MG/GM vaginal cream Place 1 Applicatorful vaginally 2 (two) times a week. Pt takes 2-3 times a week    . fenofibrate (TRICOR) 48 MG tablet Take by mouth.    . Fluocinolone Acetonide Scalp 0.01 % OIL   2  . furosemide (LASIX) 20 MG tablet Take 80 mg by mouth daily.     . gMarland Kitchenbapentin (NEURONTIN) 300 MG capsule Take 300 mg by mouth 3 (three) times daily. Pt doesn't always take 3 times a day    .  Insulin Glargine (LANTUS SOLOSTAR) 100 UNIT/ML Solostar Pen Inject 48 Units into the skin at bedtime.     . iron polysaccharides (NIFEREX) 150 MG capsule Take by mouth.    . liraglutide (VICTOZA) 18 MG/3ML SOPN Inject 1.8 mg into the skin.    . LMarland KitchenRazepam (ATIVAN) 0.5 MG tablet Take 0.5 mg by mouth every 8 (eight) hours as needed.     . lMarland Kitchensartan (COZAAR) 100 MG tablet Take 100 mg by mouth daily.    . metoprolol tartrate (LOPRESSOR) 25 MG tablet Take 25 mg by mouth 2 (two) times daily.    . nitroGLYCERIN (NITROSTAT) 0.4 MG SL tablet Place 0.4 mg under the tongue every 5 (five) minutes as needed for chest pain.    . pantoprazole (PROTONIX) 40 MG tablet Take 40 mg by mouth daily.     . rMarland KitchenPINIRole (REQUIP) 0.5 MG tablet Take 0.5 mg by mouth at bedtime.    . sertraline (ZOLOFT) 50 MG tablet Take 50 mg by mouth daily.     . traMADol (ULTRAM) 50 MG tablet Take 50 mg by mouth every 12 (twelve) hours as needed.      No current facility-administered medications for this visit.     PHYSICAL EXAMINATION: ECOG PERFORMANCE STATUS: 1 - Symptomatic but completely ambulatory Vitals:   10/23/19 0953  BP: (!) 192/78  Pulse: 70  Resp: 18  Temp: (!) 96.9 F (36.1 C)   Filed Weights   10/23/19 0953  Weight: 174 lb 6.4 oz (79.1 kg)    Physical Exam  Constitutional: She is oriented to person, place, and time and well-developed, well-nourished, and in no distress. No distress.  HENT:  Head: Normocephalic and atraumatic.  Nose: Nose normal.  Mouth/Throat: Oropharynx is clear and moist. No oropharyngeal exudate.  Eyes: Pupils are equal, round, and reactive to light. Conjunctivae and EOM are normal. Left eye exhibits no discharge. No scleral icterus.  Cardiovascular: Normal rate and regular rhythm.  No murmur heard. Pulmonary/Chest: Effort normal. No respiratory distress. She has no wheezes. She has no rales. She exhibits no tenderness.  Abdominal: Soft. She exhibits no distension and no mass. There is  no abdominal tenderness.  Musculoskeletal:        General: No edema. Normal range of motion.     Cervical back: Normal range of motion  and neck supple.  Lymphadenopathy:    She has no cervical adenopathy.  Neurological: She is alert and oriented to person, place, and time. No cranial nerve deficit. She exhibits normal muscle tone. Coordination normal.  Skin: Skin is warm and dry. She is not diaphoretic. No erythema.  Small bruises.   Psychiatric: Affect and judgment normal.     LABORATORY DATA:  I have reviewed the data as listed Lab Results  Component Value Date   WBC 5.6 10/23/2019   HGB 10.5 (L) 10/23/2019   HCT 32.0 (L) 10/23/2019   MCV 95.2 10/23/2019   PLT 81 (L) 10/23/2019   No results for input(s): NA, K, CL, CO2, GLUCOSE, BUN, CREATININE, CALCIUM, GFRNONAA, GFRAA, PROT, ALBUMIN, AST, ALT, ALKPHOS, BILITOT, BILIDIR, IBILI in the last 8760 hours.  Bone marrow biopsy results:  Bone Marrow, Aspirate,Biopsy, and Clot, right ilium Mildly hypercellular marrow with mild erythroid and megakaryocytic dysplastic.  See comments Peripheral blood Thrombocytopenia Diagnosis Note The marrow is mildly hypercellular. There is mild dysplasia in the erythroid and megakaryocytic lineages. While the findings are suggestive of a low grade myelodysplastic syndrome, reactive causes of dysplasia (nutritional deficiencies, medication, etc.) must be excluded before a diagnosis is established. Correlation with FISH is recommended Cytogenetics normal.  MDS FISH panel for chromosome 5, 7, 8 negative.  ASSESSMENT & PLAN:  1. Thrombocytopenia ([place])   2. MDS (myelodysplastic syndrome) ([place])   3. Normocytic anemia    #MDS, very low risk, IPSS 1.5.   Labs reviewed and discussed with patient and her daughter. Overall, count has slightly decreased since last visit. Hemoglobin has decreased to 10.1, platelet counts decreased to 70,000. He does not have constitutional symptoms.  Increased  fatigue #Anemia, hemoglobin has decreased to 10.1.  Likely secondary to anemia due to CKD.  MDS may contribute to that as well .Recommend repeat CBC, check iron TIBC ferritin, smear.  Continue observation for MDS  -Labs reviewed.  Adequate iron panel.  Continue oral iron supplementation.  Thrombocytopenia appears to be stable.  Monitor. All questions were answered to patient's satisfaction.  The patient knows to call the clinic with any problems questions or concerns.  Return of visit: 3 months with MD assessment and labs [CBC, CMP, folate, vitamin B12, iron, TIBC ferritin,]  Earlie Server, MD, PhD. Hematology [place] [facility] Pager- [phone] 10/23/2019

## 2019-10-27 ENCOUNTER — Encounter: Payer: Self-pay | Admitting: Oncology

## 2019-12-03 ENCOUNTER — Other Ambulatory Visit: Payer: Self-pay | Admitting: Family Medicine

## 2019-12-03 DIAGNOSIS — N184 Chronic kidney disease, stage 4 (severe): Secondary | ICD-10-CM

## 2019-12-10 ENCOUNTER — Ambulatory Visit
Admission: RE | Admit: 2019-12-10 | Discharge: 2019-12-10 | Disposition: A | Discharge status: Home / Self Care | Payer: Medicare Other | Source: Ambulatory Visit | Attending: Family Medicine | Admitting: Family Medicine

## 2019-12-10 ENCOUNTER — Other Ambulatory Visit: Payer: Self-pay

## 2019-12-10 DIAGNOSIS — N184 Chronic kidney disease, stage 4 (severe): Secondary | ICD-10-CM | POA: Insufficient documentation

## 2020-01-27 ENCOUNTER — Inpatient Hospital Stay (HOSPITAL_BASED_OUTPATIENT_CLINIC_OR_DEPARTMENT_OTHER): Payer: Medicare Other | Admitting: Oncology

## 2020-01-27 ENCOUNTER — Encounter: Payer: Self-pay | Admitting: Oncology

## 2020-01-27 ENCOUNTER — Other Ambulatory Visit: Payer: Self-pay

## 2020-01-27 ENCOUNTER — Inpatient Hospital Stay: Payer: Medicare Other | Attending: Oncology

## 2020-01-27 VITALS — BP 188/72 | HR 73 | Temp 97.6°F | Resp 18 | Wt 177.6 lb

## 2020-01-27 DIAGNOSIS — Z8249 Family history of ischemic heart disease and other diseases of the circulatory system: Secondary | ICD-10-CM | POA: Insufficient documentation

## 2020-01-27 DIAGNOSIS — Z79899 Other long term (current) drug therapy: Secondary | ICD-10-CM | POA: Insufficient documentation

## 2020-01-27 DIAGNOSIS — D469 Myelodysplastic syndrome, unspecified: Secondary | ICD-10-CM | POA: Diagnosis not present

## 2020-01-27 DIAGNOSIS — E1121 Type 2 diabetes mellitus with diabetic nephropathy: Secondary | ICD-10-CM | POA: Insufficient documentation

## 2020-01-27 DIAGNOSIS — Z833 Family history of diabetes mellitus: Secondary | ICD-10-CM | POA: Insufficient documentation

## 2020-01-27 DIAGNOSIS — K219 Gastro-esophageal reflux disease without esophagitis: Secondary | ICD-10-CM | POA: Diagnosis not present

## 2020-01-27 DIAGNOSIS — D696 Thrombocytopenia, unspecified: Secondary | ICD-10-CM

## 2020-01-27 DIAGNOSIS — G2581 Restless legs syndrome: Secondary | ICD-10-CM | POA: Diagnosis not present

## 2020-01-27 DIAGNOSIS — D649 Anemia, unspecified: Secondary | ICD-10-CM | POA: Diagnosis not present

## 2020-01-27 DIAGNOSIS — I509 Heart failure, unspecified: Secondary | ICD-10-CM | POA: Diagnosis not present

## 2020-01-27 DIAGNOSIS — Z803 Family history of malignant neoplasm of breast: Secondary | ICD-10-CM | POA: Insufficient documentation

## 2020-01-27 DIAGNOSIS — I110 Hypertensive heart disease with heart failure: Secondary | ICD-10-CM | POA: Insufficient documentation

## 2020-01-27 DIAGNOSIS — E785 Hyperlipidemia, unspecified: Secondary | ICD-10-CM | POA: Diagnosis not present

## 2020-01-27 DIAGNOSIS — Z90710 Acquired absence of both cervix and uterus: Secondary | ICD-10-CM | POA: Diagnosis not present

## 2020-01-27 DIAGNOSIS — D509 Iron deficiency anemia, unspecified: Secondary | ICD-10-CM | POA: Insufficient documentation

## 2020-01-27 DIAGNOSIS — Z794 Long term (current) use of insulin: Secondary | ICD-10-CM | POA: Diagnosis not present

## 2020-01-27 DIAGNOSIS — Z87891 Personal history of nicotine dependence: Secondary | ICD-10-CM | POA: Diagnosis not present

## 2020-01-27 LAB — CBC WITH DIFFERENTIAL/PLATELET
Abs Immature Granulocytes: 0.03 10*3/uL (ref 0.00–0.07)
Basophils Absolute: 0 10*3/uL (ref 0.0–0.1)
Basophils Relative: 1 %
Eosinophils Absolute: 0.1 10*3/uL (ref 0.0–0.5)
Eosinophils Relative: 2 %
HCT: 29.4 % — ABNORMAL LOW (ref 36.0–46.0)
Hemoglobin: 9.9 g/dL — ABNORMAL LOW (ref 12.0–15.0)
Immature Granulocytes: 1 %
Lymphocytes Relative: 26 %
Lymphs Abs: 1.4 10*3/uL (ref 0.7–4.0)
MCH: 31 pg (ref 26.0–34.0)
MCHC: 33.7 g/dL (ref 30.0–36.0)
MCV: 92.2 fL (ref 80.0–100.0)
Monocytes Absolute: 0.4 10*3/uL (ref 0.1–1.0)
Monocytes Relative: 7 %
Neutro Abs: 3.4 10*3/uL (ref 1.7–7.7)
Neutrophils Relative %: 63 %
Platelets: 76 10*3/uL — ABNORMAL LOW (ref 150–400)
RBC: 3.19 MIL/uL — ABNORMAL LOW (ref 3.87–5.11)
RDW: 14.7 % (ref 11.5–15.5)
WBC: 5.4 10*3/uL (ref 4.0–10.5)
nRBC: 0 % (ref 0.0–0.2)

## 2020-01-27 LAB — COMPREHENSIVE METABOLIC PANEL
ALT: 19 U/L (ref 0–44)
AST: 24 U/L (ref 15–41)
Albumin: 3.9 g/dL (ref 3.5–5.0)
Alkaline Phosphatase: 124 U/L (ref 38–126)
Anion gap: 10 (ref 5–15)
BUN: 59 mg/dL — ABNORMAL HIGH (ref 8–23)
CO2: 23 mmol/L (ref 22–32)
Calcium: 9.2 mg/dL (ref 8.9–10.3)
Chloride: 106 mmol/L (ref 98–111)
Creatinine, Ser: 2.44 mg/dL — ABNORMAL HIGH (ref 0.44–1.00)
GFR calc Af Amer: 20 mL/min — ABNORMAL LOW (ref >60)
GFR calc non Af Amer: 17 mL/min — ABNORMAL LOW (ref >60)
Glucose, Bld: 154 mg/dL — ABNORMAL HIGH (ref 70–99)
Potassium: 3.9 mmol/L (ref 3.5–5.1)
Sodium: 139 mmol/L (ref 135–145)
Total Bilirubin: 0.6 mg/dL (ref 0.3–1.2)
Total Protein: 6.8 g/dL (ref 6.5–8.1)

## 2020-01-27 LAB — IRON AND TIBC
Iron: 64 ug/dL (ref 28–170)
Saturation Ratios: 17 % (ref 10.4–31.8)
TIBC: 379 ug/dL (ref 250–450)
UIBC: 315 ug/dL

## 2020-01-27 LAB — FOLATE: Folate: 6.3 ng/mL (ref >5.9)

## 2020-01-27 LAB — FERRITIN: Ferritin: 170 ng/mL (ref 11–307)

## 2020-01-27 LAB — VITAMIN B12: Vitamin B-12: 345 pg/mL (ref 180–914)

## 2020-01-27 NOTE — Progress Notes (Signed)
Hematology/Oncology  Follow up note [facility] Telephone:([phone] [phone]   Patient Care Team: Tracie Harrier, MD as PCP - General (Internal Medicine) Alisa Graff, FNP as Nurse Practitioner (Family Medicine) Ubaldo Glassing Javier Docker, MD as Consulting Physician (Cardiology) Solum, Betsey Holiday, MD as Physician Assistant (Endocrinology)  REFERRING PROVIDER: [facility].  CHIEF COMPLAINTS/PURPOSE OF CONSULTATION:  Evaluation of thrombocytopenia  HISTORY OF PRESENTING ILLNESS:  Katherine Hernandez is a  86 y.o.  female with PMH listed below who was referred to me for evaluation of thrombocytopenia.  Patient was seen by [facility] for iron deficiency anemia/epigastric pain/GERD.  Patient had a workup for GI bleed with EGD/colonoscopy.   She has to taken iron supplements twice a day.  Was noted that on her CBC she has thrombocytopenia.  And referred to [place] for further evaluation.  Patient denies having any acute bleeding.  She has easy bruising. Reviewed patient's lab work at [facility].  Her hemoglobin is 11.3, platelet counts 72,000, normal differential.  Her thrombocytopenia can trace back to September 2015, at that time her counts was 95,000. Patient reports mild fatigue otherwise feeling well.  Good appetite no weight loss.  No fever chills, night sweats.  For her thrombocytopenia,US abdomen showed no hepatosplenomegaly.  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 Normal cytogenetics. MDS IPSS score 1.5, very low risk.    INTERVAL HISTORY Katherine Hernandez is a 86 y.o. female who has above history reviewed by me today presents for follow up visit for management of thrombocytopenia-low-grade MDS Patient is accompanied by her daughter. She has  recently established care with kidney doctor due to worsening of kidney function. Bilateral lower extremity edema is worse. Blood pressure was high in the clinic.  Denies any headache, chest pain.  She took her blood pressure medication 40 minutes prior to her current clinical visit.    Review of Systems  Constitutional: Positive for malaise/fatigue. Negative for chills, fever and weight loss.  HENT: Negative for sore throat.   Eyes: Negative for redness.  Respiratory: Negative for cough, shortness of breath and wheezing.   Cardiovascular: Positive for leg swelling. Negative for chest pain and palpitations.  Gastrointestinal: Negative for abdominal pain, blood in stool, nausea and vomiting.  Genitourinary: Negative for dysuria.  Musculoskeletal: Negative for myalgias.  Skin: Negative for rash.  Neurological: Negative for dizziness, tingling and tremors.  Endo/Heme/Allergies: Does not bruise/bleed easily.  Psychiatric/Behavioral: Negative for hallucinations.    MEDICAL HISTORY:  Past Medical History:  Diagnosis Date  . Arthritis   . CAD (coronary artery disease)    Last nuclear 02/2009 EF 72% and neg for ischemia   . CHF (congestive heart failure) ([place])   . Coronary artery disease    CAD with PTCA(cfx)fx -1993,and CABG ,2004  . Diabetes mellitus without complication ([place])   . Diabetic nephropathy ([place])   . Diabetic peripheral neuropathy ([place])   . GERD (gastroesophageal reflux disease)   . Hyperlipidemia   . Hypertension   . Obesity   . Retinopathy, diabetic, background ([place])   . RLS (restless legs syndrome)   . Thrombocytopenia ([employer])   . Urinary incontinence     SURGICAL HISTORY: Past Surgical History:  Procedure Laterality Date  . ABDOMINAL HYSTERECTOMY    . BONE  MARROW BIOPSY    . BREAST BIOPSY Left 1970   surgical exc  . BREAST BIOPSY Right 09/13/2016   path pending x 2 areas  . CHOLECYSTECTOMY  1968  . COLONOSCOPY WITH PROPOFOL N/A 01/02/2017   Procedure:  COLONOSCOPY WITH PROPOFOL;  Surgeon: Lollie Sails, MD;  Location: [facility] ENDOSCOPY;  Service: Endoscopy;  Laterality: N/A;  . CORONARY ANGIOPLASTY    . CORONARY ARTERY BYPASS GRAFT  02/2003  . ery    . ESOPHAGOGASTRODUODENOSCOPY (EGD) WITH PROPOFOL N/A 01/02/2017   Procedure: ESOPHAGOGASTRODUODENOSCOPY (EGD) WITH PROPOFOL;  Surgeon: Lollie Sails, MD;  Location: [facility] ENDOSCOPY;  Service: Endoscopy;  Laterality: N/A;    SOCIAL HISTORY: Social History   Socioeconomic History  . Marital status: Widowed    Spouse name: Not on file  . Number of children: 3  . Years of education: 8  . Highest education level: 8th grade  Occupational History  . Occupation: retired  Tobacco Use  . Smoking status: Former Smoker    Packs/day: 1.00    Years: 15.00    Pack years: 15.00    Types: Cigarettes    Quit date: 07/17/1978    Years since quitting: 41.5  . Smokeless tobacco: Never Used  . Tobacco comment: quit in year 1980  Vaping Use  . Vaping Use: Never used  Substance and Sexual Activity  . Alcohol use: No  . Drug use: No  . Sexual activity: Never  Other Topics Concern  . Not on file  Social History Narrative   History of smoking cigarettes: Former smoker   Quit in year : 1980   Pack-year Hx: 40   No alcohol.   Caffeine :yes    Diet : no   Exercise :no   Occupation : unemployed, retired    No Psychologist, educational status: single,widowed   LV systolic Dysfunction   EF less than 40 >no   Social Determinants of Radio broadcast assistant Strain:   . Difficulty of Paying Living Expenses:   Food Insecurity:   . Worried About Charity fundraiser in the Last Year:   . Arboriculturist in the Last Year:   Transportation Needs:   . Film/video editor (Medical):   Marland Kitchen Lack of Transportation (Non-Medical):   Physical Activity:   . Days of Exercise per Week:   . Minutes of Exercise per Session:   Stress:   . Feeling of Stress :   Social Connections:   . Frequency of  Communication with Friends and Family:   . Frequency of Social Gatherings with Friends and Family:   . Attends Religious Services:   . Active Member of Clubs or Organizations:   . Attends Archivist Meetings:   Marland Kitchen Marital Status:   Intimate Partner Violence:   . Fear of Current or Ex-Partner:   . Emotionally Abused:   Marland Kitchen Physically Abused:   . Sexually Abused:     FAMILY HISTORY: Family History  Problem Relation Age of Onset  . Breast cancer Sister 14  . Breast cancer Sister   . Diabetes Mother   . Heart disease Mother   . Kidney disease Father   . Hypertension Father   . Fanconi anemia Sister     ALLERGIES:  is allergic to amoxicillin, cefdinir, doxycycline, eliquis [apixaban], erythromycin, lisinopril, macrodantin [nitrofurantoin], mobic [meloxicam], statins, and sulfa antibiotics.  MEDICATIONS:  Current Outpatient Medications  Medication Sig Dispense Refill  . albuterol (PROVENTIL HFA;VENTOLIN HFA) 108 (90  Base) MCG/ACT inhaler Inhale 2 puffs every 6 (six) hours as needed into the lungs for wheezing or shortness of breath.    . allopurinol (ZYLOPRIM) 100 MG tablet Take 200 mg by mouth daily.     . cetirizine (ZYRTEC) 10 MG tablet Take 10 mg by mouth daily.    Marland Kitchen conjugated estrogens (PREMARIN) vaginal cream INSERT 0.5 GRAMS VAGINALLY  TWICE WEEKLY    . Cyanocobalamin (B-12 COMPLIANCE INJECTION IJ) Inject as directed every 30 (thirty) days.    . ergocalciferol (VITAMIN D2) 50000 units capsule Take 50,000 Units by mouth once a week.    . estradiol (ESTRACE) 0.1 MG/GM vaginal cream Place 1 Applicatorful vaginally 2 (two) times a week. Pt takes 2-3 times a week    . fenofibrate (TRICOR) 48 MG tablet Take by mouth.    . Fluocinolone Acetonide Scalp 0.01 % OIL   2  . furosemide (LASIX) 20 MG tablet Take 60 mg by mouth daily.     Marland Kitchen gabapentin (NEURONTIN) 300 MG capsule Take 300 mg by mouth 3 (three) times daily. Pt doesn't always take 3 times a day    . Insulin Glargine  (LANTUS SOLOSTAR) 100 UNIT/ML Solostar Pen Inject 48 Units into the skin at bedtime.     . iron polysaccharides (NIFEREX) 150 MG capsule Take by mouth.    . liraglutide (VICTOZA) 18 MG/3ML SOPN Inject 1.8 mg into the skin.    Marland Kitchen LORazepam (ATIVAN) 0.5 MG tablet Take 0.5 mg by mouth every 8 (eight) hours as needed.     Marland Kitchen losartan (COZAAR) 100 MG tablet Take 100 mg by mouth daily.    . metoprolol tartrate (LOPRESSOR) 25 MG tablet Take 25 mg by mouth 2 (two) times daily.    . pantoprazole (PROTONIX) 40 MG tablet Take 40 mg by mouth daily.     Marland Kitchen rOPINIRole (REQUIP) 0.5 MG tablet Take 0.5 mg by mouth at bedtime.    . sertraline (ZOLOFT) 50 MG tablet Take 50 mg by mouth daily.     . traMADol (ULTRAM) 50 MG tablet Take 50 mg by mouth every 12 (twelve) hours as needed.     . colchicine 0.6 MG tablet Take 2 tablets (1.66m) by mouth at first sign of gout flare followed by 1 tablet (0.624m after 1 hour. (Max 1.39m21mithin 1 hour) (Patient not taking: Reported on 01/27/2020)    . levocetirizine (XYZAL) 5 MG tablet SMARTSIG:1 Tablet(s) By Mouth Every Evening    . nitroGLYCERIN (NITROSTAT) 0.4 MG SL tablet Place 0.4 mg under the tongue every 5 (five) minutes as needed for chest pain. (Patient not taking: Reported on 01/27/2020)    . sucralfate (CARAFATE) 1 g tablet Take 1 g by mouth 2 (two) times daily.    . tMarland Kitcheniamcinolone (NASACORT) 55 MCG/ACT AERO nasal inhaler 2 sprays daily.     No current facility-administered medications for this visit.     PHYSICAL EXAMINATION: ECOG PERFORMANCE STATUS: 1 - Symptomatic but completely ambulatory Vitals:   01/27/20 1002  BP: (!) 188/72  Pulse: 73  Resp: 18  Temp: 97.6 F (36.4 C)   Filed Weights   01/27/20 1002  Weight: 177 lb 9.6 oz (80.6 kg)    Physical Exam Constitutional:      General: She is not in acute distress.    Appearance: She is not diaphoretic.  HENT:     Head: Normocephalic and atraumatic.     Nose: Nose normal.     Mouth/Throat:      Pharynx:  No oropharyngeal exudate.  Eyes:     General: No scleral icterus.    Pupils: Pupils are equal, round, and reactive to light.  Cardiovascular:     Rate and Rhythm: Normal rate and regular rhythm.     Heart sounds: No murmur heard.   Pulmonary:     Effort: Pulmonary effort is normal. No respiratory distress.     Breath sounds: No wheezing or rales.  Chest:     Chest wall: No tenderness.  Abdominal:     General: There is no distension.     Palpations: Abdomen is soft. There is no mass.     Tenderness: There is no abdominal tenderness.  Musculoskeletal:        General: Normal range of motion.     Cervical back: Normal range of motion and neck supple.  Lymphadenopathy:     Cervical: No cervical adenopathy.  Skin:    General: Skin is warm and dry.     Findings: No erythema.     Comments: Small bruises.   Neurological:     Mental Status: She is alert and oriented to person, place, and time.     Cranial Nerves: No cranial nerve deficit.     Motor: No abnormal muscle tone.     Coordination: Coordination normal.  Psychiatric:        Mood and Affect: Affect normal.        Judgment: Judgment normal.      LABORATORY DATA:  I have reviewed the data as listed Lab Results  Component Value Date   WBC 5.4 01/27/2020   HGB 9.9 (L) 01/27/2020   HCT 29.4 (L) 01/27/2020   MCV 92.2 01/27/2020   PLT 76 (L) 01/27/2020   Recent Labs    01/27/20 0940  NA 139  K 3.9  CL 106  CO2 23  GLUCOSE 154*  BUN 59*  CREATININE 2.44*  CALCIUM 9.2  GFRNONAA 17*  GFRAA 20*  PROT 6.8  ALBUMIN 3.9  AST 24  ALT 19  ALKPHOS 124  BILITOT 0.6    Bone marrow biopsy results:  Bone Marrow, Aspirate,Biopsy, and Clot, right ilium Mildly hypercellular marrow with mild erythroid and megakaryocytic dysplastic.  See comments Peripheral blood Thrombocytopenia Diagnosis Note The marrow is mildly hypercellular. There is mild dysplasia in the erythroid and megakaryocytic lineages. While  the findings are suggestive of a low grade myelodysplastic syndrome, reactive causes of dysplasia (nutritional deficiencies, medication, etc.) must be excluded before a diagnosis is established. Correlation with FISH is recommended Cytogenetics normal.  MDS FISH panel for chromosome 5, 7, 8 negative.  ASSESSMENT & PLAN:  1. Thrombocytopenia ([place])   2. MDS (myelodysplastic syndrome) ([place])   3. Normocytic anemia    #MDS, very low risk, IPSS 1.5.   Labs reviewed and discussed with patient. Overall her platelet counts has remained relatively stable in the 70s. Hemoglobin has decreased to 9.9.  Worsening of anemia can be due to MDS progression versus anemia secondary to chronic kidney disease. Iron panel showed ferritin of 170, iron saturation 17. Continue oral iron supplementation.  Niferex  All questions were answered to patient's satisfaction.  The patient knows to call the clinic with any problems questions or concerns.  Return of visit: 4 months with MD assessment and labs [CBC, CMP, folate, vitamin B12, iron, TIBC ferritin,]  Earlie Server, MD, PhD. Hematology [place] [facility] Pager- [phone] 01/27/2020

## 2020-01-27 NOTE — Progress Notes (Signed)
Pt here for follow up. Since last visit here, she has seen a Kidney specialist due to worsening Kidney levels. Pt's blood pressure is elevated, denies headache or lightheadedness. Reports that she just took bp meds about 45 minutes ago.

## 2020-02-05 ENCOUNTER — Ambulatory Visit
Admission: EM | Admit: 2020-02-05 | Discharge: 2020-02-05 | Disposition: A | Discharge status: Home / Self Care | Payer: Medicare Other | Attending: Emergency Medicine | Admitting: Emergency Medicine

## 2020-02-05 ENCOUNTER — Other Ambulatory Visit: Payer: Self-pay

## 2020-02-05 DIAGNOSIS — I2510 Atherosclerotic heart disease of native coronary artery without angina pectoris: Secondary | ICD-10-CM | POA: Insufficient documentation

## 2020-02-05 DIAGNOSIS — Z87891 Personal history of nicotine dependence: Secondary | ICD-10-CM | POA: Insufficient documentation

## 2020-02-05 DIAGNOSIS — I509 Heart failure, unspecified: Secondary | ICD-10-CM | POA: Diagnosis not present

## 2020-02-05 DIAGNOSIS — E785 Hyperlipidemia, unspecified: Secondary | ICD-10-CM | POA: Insufficient documentation

## 2020-02-05 DIAGNOSIS — Z794 Long term (current) use of insulin: Secondary | ICD-10-CM | POA: Diagnosis not present

## 2020-02-05 DIAGNOSIS — I110 Hypertensive heart disease with heart failure: Secondary | ICD-10-CM | POA: Insufficient documentation

## 2020-02-05 DIAGNOSIS — E119 Type 2 diabetes mellitus without complications: Secondary | ICD-10-CM | POA: Diagnosis not present

## 2020-02-05 DIAGNOSIS — Z79899 Other long term (current) drug therapy: Secondary | ICD-10-CM | POA: Insufficient documentation

## 2020-02-05 DIAGNOSIS — I10 Essential (primary) hypertension: Secondary | ICD-10-CM

## 2020-02-05 LAB — POCT URINALYSIS DIP (MANUAL ENTRY)
Bilirubin, UA: NEGATIVE
Glucose, UA: NEGATIVE mg/dL
Ketones, POC UA: NEGATIVE mg/dL
Nitrite, UA: NEGATIVE
Protein Ur, POC: NEGATIVE mg/dL
Spec Grav, UA: 1.015 (ref 1.010–1.025)
Urobilinogen, UA: 0.2 E.U./dL
pH, UA: 5.5 (ref 5.0–8.0)

## 2020-02-05 LAB — POCT FASTING CBG KUC MANUAL ENTRY: POCT Glucose (KUC): 152 mg/dL — AB (ref 70–99)

## 2020-02-05 NOTE — ED Provider Notes (Signed)
Roderic Palau    CSN: [id number] Arrival date & time: 02/05/20  1413      History   Chief Complaint Chief Complaint  Patient presents with  . Hypertension    HPI VERLINE KONG is a 86 y.o. female.   Patient presents with elevated blood pressure today.  She is a resident at [facility]; she went to the nurse to have her blood pressure checked today because it was elevated at a doctor's appointment last week and she wanted to have it rechecked.  Her blood pressure was 235/90 at that time.  Patient denies dizziness, weakness, headache, chest pain, shortness of breath, or other symptoms.  She does report increased swelling in her ankles in the past few days.  Patient has significant medical history, including congestive heart failure, hypertension, CAD, and diabetes.  Her daughter is her medical POA and declines transfer to the ED.  The history is provided by the patient and a relative.    Past Medical History:  Diagnosis Date  . Arthritis   . CAD (coronary artery disease)    Last nuclear 02/2009 EF 72% and neg for ischemia   . CHF (congestive heart failure) ([place])   . Coronary artery disease    CAD with PTCA(cfx)fx -1993,and CABG ,2004  . Diabetes mellitus without complication ([place])   . Diabetic nephropathy ([place])   . Diabetic peripheral neuropathy ([place])   . GERD (gastroesophageal reflux disease)   . Hyperlipidemia   . Hypertension   . Obesity   . Retinopathy, diabetic, background ([place])   . RLS (restless legs syndrome)   . Thrombocytopenia ([place])   . Urinary incontinence     Patient Active Problem List   Diagnosis Date Noted  . MDS (myelodysplastic syndrome) ([place]) 12/03/2017  . Normocytic anemia 12/03/2017  . Thrombocytopenia ([place]) 12/03/2017  . HTN (hypertension) 06/07/2017  . Atrial fibrillation ([place]) 06/07/2017  . Diabetes ([place]) 06/07/2017  . Lymphedema 06/07/2017  . CHF (congestive heart failure) ([place]) 05/25/2017    Past Surgical History:    Procedure Laterality Date  . ABDOMINAL HYSTERECTOMY    . BONE MARROW BIOPSY    . BREAST BIOPSY Left 1970   surgical exc  . BREAST BIOPSY Right 09/13/2016   path pending x 2 areas  . CHOLECYSTECTOMY  1968  . COLONOSCOPY WITH PROPOFOL N/A 01/02/2017   Procedure: COLONOSCOPY WITH PROPOFOL;  Surgeon: Lollie Sails, MD;  Location: [facility] ENDOSCOPY;  Service: Endoscopy;  Laterality: N/A;  . CORONARY ANGIOPLASTY    . CORONARY ARTERY BYPASS GRAFT  02/2003  . ery    . ESOPHAGOGASTRODUODENOSCOPY (EGD) WITH PROPOFOL N/A 01/02/2017   Procedure: ESOPHAGOGASTRODUODENOSCOPY (EGD) WITH PROPOFOL;  Surgeon: Lollie Sails, MD;  Location: [facility] ENDOSCOPY;  Service: Endoscopy;  Laterality: N/A;    OB History   No obstetric history on file.      Home Medications    Prior to Admission medications   Medication Sig Start Date End Date Taking? Authorizing Provider  albuterol (PROVENTIL HFA;VENTOLIN HFA) 108 (90 Base) MCG/ACT inhaler Inhale 2 puffs every 6 (six) hours as needed into the lungs for wheezing or shortness of breath.    [provider]  allopurinol (ZYLOPRIM) 100 MG tablet Take 200 mg by mouth daily.  08/06/17   [provider]  cetirizine (ZYRTEC) 10 MG tablet Take 10 mg by mouth daily.    [provider]  colchicine 0.6 MG tablet Take 2 tablets (1.85m) by mouth at first sign of gout flare  followed by 1 tablet (0.9m) after 1 hour. (Max 1.852mwithin 1 hour) Patient not taking: Reported on 01/27/2020 10/13/19   [provider]  conjugated estrogens (PREMARIN) vaginal cream INSERT 0.5 GRAMS VAGINALLY  TWICE WEEKLY 10/15/19   [provider]  Cyanocobalamin (B-12 COMPLIANCE INJECTION IJ) Inject as directed every 30 (thirty) days.    [provider]  ergocalciferol (VITAMIN D2) 50000 units capsule Take 50,000 Units by mouth once a week.    [provider]  estradiol (ESTRACE) 0.1 MG/GM vaginal cream Place 1 Applicatorful vaginally 2  (two) times a week. Pt takes 2-3 times a week    [provider]  fenofibrate (TRICOR) 48 MG tablet Take by mouth. 10/13/19 10/12/20  [provider]  Fluocinolone Acetonide Scalp 0.01 % OIL  11/13/17   [provider]  furosemide (LASIX) 20 MG tablet Take 60 mg by mouth daily.     [provider]  gabapentin (NEURONTIN) 300 MG capsule Take 300 mg by mouth 3 (three) times daily. Pt doesn't always take 3 times a day    [provider]  Insulin Glargine (LANTUS SOLOSTAR) 100 UNIT/ML Solostar Pen Inject 48 Units into the skin at bedtime.     [provider]  iron polysaccharides (NIFEREX) 150 MG capsule Take by mouth. 10/13/19   [provider]  levocetirizine (XYZAL) 5 MG tablet SMARTSIG:1 Tablet(s) By Mouth Every Evening 01/14/20   [provider]  liraglutide (VICTOZA) 18 MG/3ML SOPN Inject 1.8 mg into the skin.    [provider]  LORazepam (ATIVAN) 0.5 MG tablet Take 0.5 mg by mouth every 8 (eight) hours as needed.     [provider]  losartan (COZAAR) 100 MG tablet Take 100 mg by mouth daily.    [provider]  metoprolol tartrate (LOPRESSOR) 25 MG tablet Take 25 mg by mouth 2 (two) times daily.    [provider]  nitroGLYCERIN (NITROSTAT) 0.4 MG SL tablet Place 0.4 mg under the tongue every 5 (five) minutes as needed for chest pain. Patient not taking: Reported on 01/27/2020    [provider]  pantoprazole (PROTONIX) 40 MG tablet Take 40 mg by mouth daily.     [provider]  rOPINIRole (REQUIP) 0.5 MG tablet Take 0.5 mg by mouth at bedtime.    [provider]  sertraline (ZOLOFT) 50 MG tablet Take 50 mg by mouth daily.  10/23/17 01/27/20  [provider]  sucralfate (CARAFATE) 1 g tablet Take 1 g by mouth 2 (two) times daily. 11/24/19   [provider]  traMADol (ULTRAM) 50 MG tablet Take 50 mg by mouth every 12 (twelve) hours as needed.  01/09/18    [provider]  triamcinolone (NASACORT) 55 MCG/ACT AERO nasal inhaler 2 sprays daily. 11/24/19   [provider]    Family History Family History  Problem Relation Age of Onset  . Breast cancer Sister 5066. Breast cancer Sister   . Diabetes Mother   . Heart disease Mother   . Kidney disease Father   . Hypertension Father   . Fanconi anemia Sister     Social History Social History   Tobacco Use  . Smoking status: Former Smoker    Packs/day: 1.00    Years: 15.00    Pack years: 15.00    Types: Cigarettes    Quit date: 07/17/1978    Years since quitting: 41.5  . Smokeless tobacco: Never Used  . Tobacco comment: quit in  year 14  Vaping Use  . Vaping Use: Never used  Substance Use Topics  . Alcohol use: No  . Drug use: No     Allergies   Amoxicillin, Cefdinir, Doxycycline, Eliquis [apixaban], Erythromycin, Lisinopril, Macrodantin [nitrofurantoin], Mobic [meloxicam], Statins, and Sulfa antibiotics   Review of Systems Review of Systems  Constitutional: Negative for chills and fever.  HENT: Negative for ear pain and sore throat.   Eyes: Negative for pain and visual disturbance.  Respiratory: Negative for cough and shortness of breath.   Cardiovascular: Negative for chest pain and palpitations.  Gastrointestinal: Negative for abdominal pain, nausea and vomiting.  Genitourinary: Negative for dysuria and hematuria.  Musculoskeletal: Negative for arthralgias and back pain.  Skin: Negative for color change and rash.  Neurological: Negative for dizziness, seizures, syncope, facial asymmetry, speech difficulty, weakness, numbness and headaches.  All other systems reviewed and are negative.    Physical Exam Triage Vital Signs ED Triage Vitals  Enc Vitals Group     BP 02/05/20 1424 (!) 206/80     Pulse --      Resp 02/05/20 1424 18     Temp 02/05/20 1424 98 F (36.7 C)     Temp Source 02/05/20 1424 Oral     SpO2 02/05/20 1424 98 %     Weight  02/05/20 1425 177 lb 11.1 oz (80.6 kg)     Height 02/05/20 1425 5' 1" (1.549 m)     Head Circumference --      Peak Flow --      Pain Score 02/05/20 1425 0     Pain Loc --      Pain Edu? --      Excl. in [place]? --    No data found.  Updated Vital Signs BP (!) 203/88   Temp 98 F (36.7 C) (Oral)   Resp 18   Ht 5' 1" (1.549 m)   Wt 177 lb 11.1 oz (80.6 kg)   SpO2 98%   BMI 33.57 kg/m   Visual Acuity Right Eye Distance:   Left Eye Distance:   Bilateral Distance:    Right Eye Near:   Left Eye Near:    Bilateral Near:     Physical Exam Vitals and nursing note reviewed.  Constitutional:      General: She is not in acute distress.    Appearance: She is well-developed. She is obese. She is not ill-appearing.  HENT:     Head: Normocephalic and atraumatic.     Mouth/Throat:     Mouth: Mucous membranes are moist.  Eyes:     Conjunctiva/sclera: Conjunctivae normal.  Cardiovascular:     Rate and Rhythm: Normal rate and regular rhythm.     Heart sounds: Normal heart sounds. No murmur heard.   Pulmonary:     Effort: Pulmonary effort is normal. No respiratory distress.     Breath sounds: Normal breath sounds. No wheezing or rhonchi.  Abdominal:     Palpations: Abdomen is soft.     Tenderness: There is no abdominal tenderness. There is no guarding or rebound.  Musculoskeletal:     Cervical back: Neck supple.     Right lower leg: 2+ Edema present.     Left lower leg: 2+ Edema present.  Skin:    General: Skin is warm and dry.     Findings: No rash.  Neurological:     General: No focal deficit present.     Mental Status: She is alert.  Gait: Gait normal.  Psychiatric:        Mood and Affect: Mood normal.        Behavior: Behavior normal.      UC Treatments / Results  Labs (all labs ordered are listed, but only abnormal results are displayed) Labs Reviewed  POCT URINALYSIS DIP (MANUAL ENTRY) - Abnormal; Notable for the following components:      Result Value    Blood, UA trace-intact (*)    Leukocytes, UA Small (1+) (*)    All other components within normal limits  POCT FASTING CBG [facility] MANUAL ENTRY - Abnormal; Notable for the following components:   POCT Glucose ([facility]) 152 (*)    All other components within normal limits  NOVEL CORONAVIRUS, NAA  URINE CULTURE  CBC  COMPREHENSIVE METABOLIC PANEL    EKG   Radiology No results found.  Procedures Procedures (including critical care time)  Medications Ordered in UC Medications - No data to display  Initial Impression / Assessment and Plan / UC Course  I have reviewed the triage vital signs and the nursing notes.  Pertinent labs & imaging results that were available during my care of the patient were reviewed by me and considered in my medical decision making (see chart for details).   Elevated blood pressure reading with known HTN.  EKG shows sinus rhythm, rate 74, no ST elevation, compared to previous from 2018.  CBC, CMP pending.  PCR COVID pending.  Daughter who is medical POA declines transfer to the ED; she has an appointment to take her mother to her PCP tomorrow.  Patient's daughter also reports that her mother does not usually take her medications on time; she also is supposed to take extra Lasix when she has increased swelling in her ankles which she has not done per the daughter.  Instructed daughter to give her mother 40 mg of Lasix when she gets home and to follow-up with her PCP tomorrow as scheduled.  Strict instructions for going to the ED discussed.  Daughter and patient agree to plan of care.      Final Clinical Impressions(s) / UC Diagnoses   Final diagnoses:  Elevated blood pressure reading in office with diagnosis of hypertension     Discharge Instructions     Take an extra 40 mg of Lasix when you get home today.    Go to the emergency department if you have symptoms such as weakness, chest pain, shortness of breath, or other concerns.    Follow-up with your  doctor as scheduled tomorrow.        ED Prescriptions    None     PDMP not reviewed this encounter.   Sharion Balloon, NP 02/05/20 1540

## 2020-02-05 NOTE — ED Triage Notes (Signed)
Pt lives in [place] at [place], she went to the RN to have her BP checked today and it was elevated. She was sent her for further evaluation. Pt has no symptoms. Pt sts she has taken her BP meds today.

## 2020-02-05 NOTE — Discharge Instructions (Addendum)
Take an extra 40 mg of Lasix when you get home today.    Go to the emergency department if you have symptoms such as weakness, chest pain, shortness of breath, or other concerns.    Follow-up with your doctor as scheduled tomorrow.

## 2020-02-06 LAB — CBC
Hematocrit: 29.6 % — ABNORMAL LOW (ref 34.0–46.6)
Hemoglobin: 10 g/dL — ABNORMAL LOW (ref 11.1–15.9)
MCH: 31.1 pg (ref 26.6–33.0)
MCHC: 33.8 g/dL (ref 31.5–35.7)
MCV: 92 fL (ref 79–97)
Platelets: 99 10*3/uL — CL (ref 150–450)
RBC: 3.22 x10E6/uL — ABNORMAL LOW (ref 3.77–5.28)
RDW: 14.2 % (ref 11.7–15.4)
WBC: 6.1 10*3/uL (ref 3.4–10.8)

## 2020-02-06 LAB — COMPREHENSIVE METABOLIC PANEL
ALT: 18 IU/L (ref 0–32)
AST: 25 IU/L (ref 0–40)
Albumin/Globulin Ratio: 1.7 (ref 1.2–2.2)
Albumin: 4.2 g/dL (ref 3.6–4.6)
Alkaline Phosphatase: 123 IU/L — ABNORMAL HIGH (ref 48–121)
BUN/Creatinine Ratio: 22 (ref 12–28)
BUN: 46 mg/dL — ABNORMAL HIGH (ref 8–27)
Bilirubin Total: 0.4 mg/dL (ref 0.0–1.2)
CO2: 23 mmol/L (ref 20–29)
Calcium: 9.7 mg/dL (ref 8.7–10.3)
Chloride: 106 mmol/L (ref 96–106)
Creatinine, Ser: 2.12 mg/dL — ABNORMAL HIGH (ref 0.57–1.00)
GFR calc Af Amer: 24 mL/min/{1.73_m2} — ABNORMAL LOW (ref >59)
GFR calc non Af Amer: 20 mL/min/{1.73_m2} — ABNORMAL LOW (ref >59)
Globulin, Total: 2.5 g/dL (ref 1.5–4.5)
Glucose: 158 mg/dL — ABNORMAL HIGH (ref 65–99)
Potassium: 3.9 mmol/L (ref 3.5–5.2)
Sodium: 142 mmol/L (ref 134–144)
Total Protein: 6.7 g/dL (ref 6.0–8.5)

## 2020-02-06 LAB — URINE CULTURE

## 2020-02-06 LAB — SARS-COV-2, NAA 2 DAY TAT

## 2020-02-06 LAB — NOVEL CORONAVIRUS, NAA: SARS-CoV-2, NAA: NOT DETECTED

## 2020-03-16 ENCOUNTER — Telehealth: Payer: Self-pay | Admitting: Oncology

## 2020-03-16 NOTE — Telephone Encounter (Signed)
Pt daughter called indicating Dr. Juleen China wanted pt to have some additional labs completed here. He is supposed to send over a lab request. Please contact daughter for further details.

## 2020-03-16 NOTE — Telephone Encounter (Signed)
Dr Tasia Catchings would you like to see her sooner than the 05/2020 lab/MD appt?  03/10/20 Dr. Juleen China office notes states:  Hemoglobin is 9.9 - a significant drop from February - patient to see hematology sooner that current November appointment.

## 2020-03-17 ENCOUNTER — Other Ambulatory Visit: Payer: Self-pay | Admitting: Oncology

## 2020-03-17 ENCOUNTER — Encounter: Payer: Self-pay | Admitting: Oncology

## 2020-03-17 ENCOUNTER — Other Ambulatory Visit: Payer: Self-pay

## 2020-03-17 ENCOUNTER — Inpatient Hospital Stay: Payer: Medicare Other | Attending: Oncology

## 2020-03-17 DIAGNOSIS — Z803 Family history of malignant neoplasm of breast: Secondary | ICD-10-CM | POA: Diagnosis not present

## 2020-03-17 DIAGNOSIS — Z832 Family history of diseases of the blood and blood-forming organs and certain disorders involving the immune mechanism: Secondary | ICD-10-CM | POA: Diagnosis not present

## 2020-03-17 DIAGNOSIS — I509 Heart failure, unspecified: Secondary | ICD-10-CM | POA: Insufficient documentation

## 2020-03-17 DIAGNOSIS — Z833 Family history of diabetes mellitus: Secondary | ICD-10-CM | POA: Insufficient documentation

## 2020-03-17 DIAGNOSIS — Z8249 Family history of ischemic heart disease and other diseases of the circulatory system: Secondary | ICD-10-CM | POA: Diagnosis not present

## 2020-03-17 DIAGNOSIS — D469 Myelodysplastic syndrome, unspecified: Secondary | ICD-10-CM

## 2020-03-17 DIAGNOSIS — Z79899 Other long term (current) drug therapy: Secondary | ICD-10-CM | POA: Insufficient documentation

## 2020-03-17 DIAGNOSIS — D696 Thrombocytopenia, unspecified: Secondary | ICD-10-CM | POA: Insufficient documentation

## 2020-03-17 DIAGNOSIS — D649 Anemia, unspecified: Secondary | ICD-10-CM

## 2020-03-17 DIAGNOSIS — Z87891 Personal history of nicotine dependence: Secondary | ICD-10-CM | POA: Insufficient documentation

## 2020-03-17 DIAGNOSIS — E119 Type 2 diabetes mellitus without complications: Secondary | ICD-10-CM | POA: Insufficient documentation

## 2020-03-17 DIAGNOSIS — N185 Chronic kidney disease, stage 5: Secondary | ICD-10-CM | POA: Insufficient documentation

## 2020-03-17 DIAGNOSIS — D631 Anemia in chronic kidney disease: Secondary | ICD-10-CM | POA: Diagnosis present

## 2020-03-17 DIAGNOSIS — K219 Gastro-esophageal reflux disease without esophagitis: Secondary | ICD-10-CM | POA: Diagnosis not present

## 2020-03-17 DIAGNOSIS — E611 Iron deficiency: Secondary | ICD-10-CM | POA: Insufficient documentation

## 2020-03-17 DIAGNOSIS — N189 Chronic kidney disease, unspecified: Secondary | ICD-10-CM

## 2020-03-17 DIAGNOSIS — R5383 Other fatigue: Secondary | ICD-10-CM | POA: Insufficient documentation

## 2020-03-17 DIAGNOSIS — R1013 Epigastric pain: Secondary | ICD-10-CM | POA: Insufficient documentation

## 2020-03-17 DIAGNOSIS — Z841 Family history of disorders of kidney and ureter: Secondary | ICD-10-CM | POA: Insufficient documentation

## 2020-03-17 HISTORY — DX: Chronic kidney disease, unspecified: N18.9

## 2020-03-17 LAB — CBC WITH DIFFERENTIAL/PLATELET
Abs Immature Granulocytes: 0.03 10*3/uL (ref 0.00–0.07)
Basophils Absolute: 0.1 10*3/uL (ref 0.0–0.1)
Basophils Relative: 1 %
Eosinophils Absolute: 0.2 10*3/uL (ref 0.0–0.5)
Eosinophils Relative: 2 %
HCT: 28.7 % — ABNORMAL LOW (ref 36.0–46.0)
Hemoglobin: 9.5 g/dL — ABNORMAL LOW (ref 12.0–15.0)
Immature Granulocytes: 0 %
Lymphocytes Relative: 27 %
Lymphs Abs: 2 10*3/uL (ref 0.7–4.0)
MCH: 31.1 pg (ref 26.0–34.0)
MCHC: 33.1 g/dL (ref 30.0–36.0)
MCV: 94.1 fL (ref 80.0–100.0)
Monocytes Absolute: 0.5 10*3/uL (ref 0.1–1.0)
Monocytes Relative: 7 %
Neutro Abs: 4.7 10*3/uL (ref 1.7–7.7)
Neutrophils Relative %: 63 %
Platelets: 96 10*3/uL — ABNORMAL LOW (ref 150–400)
RBC: 3.05 MIL/uL — ABNORMAL LOW (ref 3.87–5.11)
RDW: 15 % (ref 11.5–15.5)
WBC: 7.4 10*3/uL (ref 4.0–10.5)
nRBC: 0 % (ref 0.0–0.2)

## 2020-03-17 LAB — TECHNOLOGIST SMEAR REVIEW
Plt Morphology: DECREASED
RBC Morphology: NORMAL

## 2020-03-17 LAB — COMPREHENSIVE METABOLIC PANEL
ALT: 17 U/L (ref 0–44)
AST: 25 U/L (ref 15–41)
Albumin: 4 g/dL (ref 3.5–5.0)
Alkaline Phosphatase: 108 U/L (ref 38–126)
Anion gap: 12 (ref 5–15)
BUN: 74 mg/dL — ABNORMAL HIGH (ref 8–23)
CO2: 26 mmol/L (ref 22–32)
Calcium: 9.1 mg/dL (ref 8.9–10.3)
Chloride: 101 mmol/L (ref 98–111)
Creatinine, Ser: 3.61 mg/dL — ABNORMAL HIGH (ref 0.44–1.00)
GFR calc Af Amer: 12 mL/min — ABNORMAL LOW (ref >60)
GFR calc non Af Amer: 11 mL/min — ABNORMAL LOW (ref >60)
Glucose, Bld: 97 mg/dL (ref 70–99)
Potassium: 3.7 mmol/L (ref 3.5–5.1)
Sodium: 139 mmol/L (ref 135–145)
Total Bilirubin: 0.8 mg/dL (ref 0.3–1.2)
Total Protein: 7 g/dL (ref 6.5–8.1)

## 2020-03-17 LAB — IRON AND TIBC
Iron: 58 ug/dL (ref 28–170)
Saturation Ratios: 15 % (ref 10.4–31.8)
TIBC: 399 ug/dL (ref 250–450)
UIBC: 341 ug/dL

## 2020-03-17 LAB — FERRITIN: Ferritin: 185 ng/mL (ref 11–307)

## 2020-03-17 NOTE — Telephone Encounter (Signed)
Done. Lab addon for today and pt will RTC on 9/3 @ 215 to see MD Per daughter "Mariann Laster" request.

## 2020-03-17 NOTE — Telephone Encounter (Signed)
Sure. Please move her to this week. Labs today, MD tmr or Friday. thanks

## 2020-03-18 LAB — PROTEIN ELECTROPHORESIS, SERUM
A/G Ratio: 1.3 (ref 0.7–1.7)
Albumin ELP: 3.6 g/dL (ref 2.9–4.4)
Alpha-1-Globulin: 0.3 g/dL (ref 0.0–0.4)
Alpha-2-Globulin: 0.6 g/dL (ref 0.4–1.0)
Beta Globulin: 0.9 g/dL (ref 0.7–1.3)
Gamma Globulin: 1 g/dL (ref 0.4–1.8)
Globulin, Total: 2.8 g/dL (ref 2.2–3.9)
Total Protein ELP: 6.4 g/dL (ref 6.0–8.5)

## 2020-03-18 LAB — KAPPA/LAMBDA LIGHT CHAINS
Kappa free light chain: 79.9 mg/L — ABNORMAL HIGH (ref 3.3–19.4)
Kappa, lambda light chain ratio: 2.04 — ABNORMAL HIGH (ref 0.26–1.65)
Lambda free light chains: 39.1 mg/L — ABNORMAL HIGH (ref 5.7–26.3)

## 2020-03-19 ENCOUNTER — Encounter: Payer: Self-pay | Admitting: Oncology

## 2020-03-19 ENCOUNTER — Other Ambulatory Visit: Payer: Self-pay

## 2020-03-19 ENCOUNTER — Inpatient Hospital Stay (HOSPITAL_BASED_OUTPATIENT_CLINIC_OR_DEPARTMENT_OTHER): Payer: Medicare Other | Admitting: Oncology

## 2020-03-19 VITALS — BP 178/69 | HR 65 | Temp 96.6°F | Resp 18 | Wt 176.0 lb

## 2020-03-19 DIAGNOSIS — D696 Thrombocytopenia, unspecified: Secondary | ICD-10-CM | POA: Diagnosis not present

## 2020-03-19 DIAGNOSIS — D631 Anemia in chronic kidney disease: Secondary | ICD-10-CM | POA: Diagnosis not present

## 2020-03-19 DIAGNOSIS — D469 Myelodysplastic syndrome, unspecified: Secondary | ICD-10-CM | POA: Diagnosis not present

## 2020-03-19 DIAGNOSIS — N185 Chronic kidney disease, stage 5: Secondary | ICD-10-CM

## 2020-03-19 NOTE — Progress Notes (Signed)
Pt here for follow up. No new concerns voiced.   

## 2020-03-20 NOTE — Progress Notes (Signed)
Hematology/Oncology  Follow up note [facility] Telephone:([phone] [phone]   Patient Care Team: Tracie Harrier, MD as PCP - General (Internal Medicine) Alisa Graff, FNP as Nurse Practitioner (Family Medicine) Ubaldo Glassing Javier Docker, MD as Consulting Physician (Cardiology) Gabriel Carina Betsey Holiday, MD as Physician Assistant (Endocrinology) Earlie Server, MD as Consulting Physician (Hematology and Oncology)  REFERRING PROVIDER: [facility].  CHIEF COMPLAINTS/PURPOSE OF CONSULTATION:  Follow up  thrombocytopenia  HISTORY OF PRESENTING ILLNESS:  Katherine Hernandez is a  86 y.o.  female with PMH listed below who was referred to me for evaluation of thrombocytopenia.  Patient was seen by [facility] for iron deficiency anemia/epigastric pain/GERD.  Patient had a workup for GI bleed with EGD/colonoscopy.   She has to taken iron supplements twice a day.  Was noted that on her CBC she has thrombocytopenia.  And referred to [place] for further evaluation.  Patient denies having any acute bleeding.  She has easy bruising. Reviewed patient's lab work at [facility].  Her hemoglobin is 11.3, platelet counts 72,000, normal differential.  Her thrombocytopenia can trace back to September 2015, at that time her counts was 95,000. Patient reports mild fatigue otherwise feeling well.  Good appetite no weight loss.  No fever chills, night sweats.  For her thrombocytopenia,US abdomen showed no hepatosplenomegaly.  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 Normal cytogenetics. MDS IPSS score 1.5, very low risk.    INTERVAL HISTORY Katherine Hernandez is a 86 y.o. female who has above history reviewed by me today presents for follow up visit for management of  thrombocytopenia-low-grade MDS and anemia.  Patient is accompanied by her daughter.most history was obtained from daughter.  Patient follows up with nephrology for CKD. Creatinine now has increased to 3.61.  Reports chronic fatigue. No bleeding events.  Denies any headache, chest pain.  She took her blood pressure medication 40 minutes prior to her current clinical visit.    Review of Systems  Constitutional: Positive for malaise/fatigue. Negative for chills, fever and weight loss.  HENT: Negative for sore throat.   Eyes: Negative for redness.  Respiratory: Negative for cough, shortness of breath and wheezing.   Cardiovascular: Positive for leg swelling. Negative for chest pain and palpitations.  Gastrointestinal: Negative for abdominal pain, blood in stool, nausea and vomiting.  Genitourinary: Negative for dysuria.  Musculoskeletal: Negative for myalgias.  Skin: Negative for rash.  Neurological: Negative for dizziness, tingling and tremors.  Endo/Heme/Allergies: Does not bruise/bleed easily.  Psychiatric/Behavioral: Negative for hallucinations.    MEDICAL HISTORY:  Past Medical History:  Diagnosis Date  . Arthritis   . CAD (coronary artery disease)    Last nuclear 02/2009 EF 72% and neg for ischemia   . CHF (congestive heart failure) ([place])   . CKD (chronic kidney disease) 03/17/2020  . Coronary artery disease    CAD with PTCA(cfx)fx -1993,and CABG ,2004  . Diabetes mellitus without complication ([place])   . Diabetic nephropathy ([place])   . Diabetic peripheral neuropathy ([place])   . GERD (gastroesophageal reflux disease)   . Hyperlipidemia   . Hypertension   . Obesity   . Retinopathy, diabetic, background ([place])   . RLS (restless legs syndrome)   . Thrombocytopenia ([place])   . Urinary incontinence  SURGICAL HISTORY: Past Surgical History:  Procedure Laterality Date  . ABDOMINAL HYSTERECTOMY    . BONE MARROW BIOPSY    . BREAST BIOPSY Left 1970   surgical exc  . BREAST BIOPSY  Right 09/13/2016   path pending x 2 areas  . CHOLECYSTECTOMY  1968  . COLONOSCOPY WITH PROPOFOL N/A 01/02/2017   Procedure: COLONOSCOPY WITH PROPOFOL;  Surgeon: Lollie Sails, MD;  Location[facility] ENDOSCOPY;  Service: Endoscopy;  Laterality: N/A;  . CORONARY ANGIOPLASTY    . CORONARY ARTERY BYPASS GRAFT  02/2003  . ery    . ESOPHAGOGASTRODUODENOSCOPY (EGD) WITH PROPOFOL N/A 01/02/2017   Procedure: ESOPHAGOGASTRODUODENOSCOPY (EGD) WITH PROPOFOL;  Surgeon: Lollie Sails, MD;  Location: [facility] ENDOSCOPY;  Service: Endoscopy;  Laterality: N/A;    SOCIAL HISTORY: Social History   Socioeconomic History  . Marital status: Widowed    Spouse name: Not on file  . Number of children: 3  . Years of education: 8  . Highest education level: 8th grade  Occupational History  . Occupation: retired  Tobacco Use  . Smoking status: Former Smoker    Packs/day: 1.00    Years: 15.00    Pack years: 15.00    Types: Cigarettes    Quit date: 07/17/1978    Years since quitting: 41.7  . Smokeless tobacco: Never Used  . Tobacco comment: quit in year 1980  Vaping Use  . Vaping Use: Never used  Substance and Sexual Activity  . Alcohol use: No  . Drug use: No  . Sexual activity: Never  Other Topics Concern  . Not on file  Social History Narrative   History of smoking cigarettes: Former smoker   Quit in year : 1980   Pack-year Hx: 40   No alcohol.   Caffeine :yes    Diet : no   Exercise :no   Occupation : unemployed, retired    No Psychologist, educational status: single,widowed   LV systolic Dysfunction   EF less than 40 >no   Social Determinants of Radio broadcast assistant Strain:   . Difficulty of Paying Living Expenses: Not on file  Food Insecurity:   . Worried About Charity fundraiser in the Last Year: Not on file  . Ran Out of Food in the Last Year: Not on file  Transportation Needs:   . Lack of Transportation (Medical): Not on file  . Lack of Transportation (Non-Medical): Not on  file  Physical Activity:   . Days of Exercise per Week: Not on file  . Minutes of Exercise per Session: Not on file  Stress:   . Feeling of Stress : Not on file  Social Connections:   . Frequency of Communication with Friends and Family: Not on file  . Frequency of Social Gatherings with Friends and Family: Not on file  . Attends Religious Services: Not on file  . Active Member of Clubs or Organizations: Not on file  . Attends Archivist Meetings: Not on file  . Marital Status: Not on file  Intimate Partner Violence:   . Fear of Current or Ex-Partner: Not on file  . Emotionally Abused: Not on file  . Physically Abused: Not on file  . Sexually Abused: Not on file    FAMILY HISTORY: Family History  Problem Relation Age of Onset  . Breast cancer Sister 54  . Breast cancer Sister   . Diabetes Mother   . Heart disease Mother   . Kidney disease Father   .  Hypertension Father   . Fanconi anemia Sister     ALLERGIES:  is allergic to amoxicillin, cefdinir, doxycycline, eliquis [apixaban], erythromycin, lisinopril, macrodantin [nitrofurantoin], mobic [meloxicam], statins, and sulfa antibiotics.  MEDICATIONS:  Current Outpatient Medications  Medication Sig Dispense Refill  . albuterol (PROVENTIL HFA;VENTOLIN HFA) 108 (90 Base) MCG/ACT inhaler Inhale 2 puffs every 6 (six) hours as needed into the lungs for wheezing or shortness of breath.    . allopurinol (ZYLOPRIM) 100 MG tablet Take 200 mg by mouth daily.     Marland Kitchen amLODipine (NORVASC) 5 MG tablet Take 5 mg by mouth daily.    . cetirizine (ZYRTEC) 10 MG tablet Take 10 mg by mouth daily.    Marland Kitchen conjugated estrogens (PREMARIN) vaginal cream INSERT 0.5 GRAMS VAGINALLY  TWICE WEEKLY    . Cyanocobalamin (B-12 COMPLIANCE INJECTION IJ) Inject as directed every 30 (thirty) days.    Marland Kitchen dicyclomine (BENTYL) 10 MG capsule Take 10 mg by mouth 4 (four) times daily as needed.    . ergocalciferol (VITAMIN D2) 50000 units capsule Take 50,000  Units by mouth once a week.    . estradiol (ESTRACE) 0.1 MG/GM vaginal cream Place 1 Applicatorful vaginally 2 (two) times a week. Pt takes 2-3 times a week    . fenofibrate (TRICOR) 48 MG tablet Take by mouth.    . Fluocinolone Acetonide Scalp 0.01 % OIL   2  . gabapentin (NEURONTIN) 300 MG capsule Take 300 mg by mouth 3 (three) times daily. Pt doesn't always take 3 times a day    . Insulin Glargine (LANTUS SOLOSTAR) 100 UNIT/ML Solostar Pen Inject 48 Units into the skin at bedtime.     . iron polysaccharides (NIFEREX) 150 MG capsule Take by mouth.    . levocetirizine (XYZAL) 5 MG tablet SMARTSIG:1 Tablet(s) By Mouth Every Evening    . liraglutide (VICTOZA) 18 MG/3ML SOPN Inject 1.8 mg into the skin.    Marland Kitchen LORazepam (ATIVAN) 0.5 MG tablet Take 0.5 mg by mouth every 8 (eight) hours as needed.     Marland Kitchen losartan (COZAAR) 100 MG tablet Take 100 mg by mouth daily.    . metoprolol tartrate (LOPRESSOR) 25 MG tablet Take 25 mg by mouth 2 (two) times daily.    . pantoprazole (PROTONIX) 40 MG tablet Take 40 mg by mouth daily.     Marland Kitchen rOPINIRole (REQUIP) 0.5 MG tablet Take 0.5 mg by mouth at bedtime.    . sertraline (ZOLOFT) 50 MG tablet Take 50 mg by mouth daily.     . sucralfate (CARAFATE) 1 g tablet Take 1 g by mouth 2 (two) times daily.    Marland Kitchen torsemide (DEMADEX) 100 MG tablet Take 100 mg by mouth daily.    . traMADol (ULTRAM) 50 MG tablet Take 50 mg by mouth every 12 (twelve) hours as needed.     . triamcinolone (NASACORT) 55 MCG/ACT AERO nasal inhaler 2 sprays daily.    . colchicine 0.6 MG tablet Take 2 tablets (1.79m) by mouth at first sign of gout flare followed by 1 tablet (0.639m after 1 hour. (Max 1.71m70mithin 1 hour) (Patient not taking: Reported on 01/27/2020)    . nitroGLYCERIN (NITROSTAT) 0.4 MG SL tablet Place 0.4 mg under the tongue every 5 (five) minutes as needed for chest pain. (Patient not taking: Reported on 01/27/2020)     No current facility-administered medications for this visit.      PHYSICAL EXAMINATION: ECOG PERFORMANCE STATUS: 1 - Symptomatic but completely ambulatory Vitals:   03/19/20 1425  BP: (!) 178/69  Pulse: 65  Resp: 18  Temp: (!) 96.6 F (35.9 C)   Filed Weights   03/19/20 1425  Weight: 176 lb (79.8 kg)    Physical Exam Constitutional:      General: She is not in acute distress.    Appearance: She is not diaphoretic.  HENT:     Head: Normocephalic and atraumatic.     Nose: Nose normal.     Mouth/Throat:     Pharynx: No oropharyngeal exudate.  Eyes:     General: No scleral icterus.    Pupils: Pupils are equal, round, and reactive to light.  Cardiovascular:     Rate and Rhythm: Normal rate and regular rhythm.     Heart sounds: No murmur heard.   Pulmonary:     Effort: Pulmonary effort is normal. No respiratory distress.     Breath sounds: No wheezing or rales.  Chest:     Chest wall: No tenderness.  Abdominal:     General: There is no distension.     Palpations: Abdomen is soft. There is no mass.     Tenderness: There is no abdominal tenderness.  Musculoskeletal:        General: Swelling present. Normal range of motion.     Cervical back: Normal range of motion and neck supple.  Lymphadenopathy:     Cervical: No cervical adenopathy.  Skin:    General: Skin is warm and dry.     Findings: No erythema.  Neurological:     Mental Status: She is alert and oriented to person, place, and time.     Cranial Nerves: No cranial nerve deficit.     Motor: No abnormal muscle tone.     Coordination: Coordination normal.  Psychiatric:        Mood and Affect: Affect normal.        Judgment: Judgment normal.      LABORATORY DATA:  I have reviewed the data as listed Lab Results  Component Value Date   WBC 7.4 03/17/2020   HGB 9.5 (L) 03/17/2020   HCT 28.7 (L) 03/17/2020   MCV 94.1 03/17/2020   PLT 96 (L) 03/17/2020   Recent Labs    01/27/20 0940 02/05/20 1505 03/17/20 1536  NA 139 142 139  K 3.9 3.9 3.7  CL 106 106 101   CO2 '23 23 26  ' GLUCOSE 154* 158* 97  BUN 59* 46* 74*  CREATININE 2.44* 2.12* 3.61*  CALCIUM 9.2 9.7 9.1  GFRNONAA 17* 20* 11*  GFRAA 20* 24* 12*  PROT 6.8 6.7 7.0  ALBUMIN 3.9 4.2 4.0  AST '24 25 25  ' ALT '19 18 17  ' ALKPHOS 124 123* 108  BILITOT 0.6 0.4 0.8    Bone marrow biopsy results:  Bone Marrow, Aspirate,Biopsy, and Clot, right ilium Mildly hypercellular marrow with mild erythroid and megakaryocytic dysplastic.  See comments Peripheral blood Thrombocytopenia Diagnosis Note The marrow is mildly hypercellular. There is mild dysplasia in the erythroid and megakaryocytic lineages. While the findings are suggestive of a low grade myelodysplastic syndrome, reactive causes of dysplasia (nutritional deficiencies, medication, etc.) must be excluded before a diagnosis is established. Correlation with FISH is recommended Cytogenetics normal.  MDS FISH panel for chromosome 5, 7, 8 negative.  ASSESSMENT & PLAN:  1. Anemia due to stage 5 chronic kidney disease, not on chronic dialysis ([place])   2. Thrombocytopenia ([place])   3. MDS (myelodysplastic syndrome) ([facility])    Anemia, multifactorial. From CKD and potentially from MDS  as well.  Iron panel showed ferritin of 185 She is on oral iron supplementation.  Recommend further improve her ferritin level to 200-300. If hemoglobin remains below 10, will consider retacrit.  Plan IV iron with Venofer 281m weekly x 3 doses. Allergy reactions/infusion reaction including anaphylactic reaction discussed with patient. Other side effects include but not limited to high blood pressure, skin rash, weight gain, leg swelling, etc. Patient voices understanding and willing to proceed.   #MDS, very low risk, IPSS 1.5.   Labs reviewed and discussed with patient. Overall her platelet count is stable at 96.  Monitor.   All questions were answered to patient's satisfaction.  The patient knows to call the clinic with any problems questions or concerns.  Return  of visit: 8 weeks.   ZEarlie Server MD, PhD. Hematology [place]at AMan[facility] LL[facility]ager- [phone]/10/2019

## 2020-04-01 ENCOUNTER — Other Ambulatory Visit: Payer: Self-pay

## 2020-04-01 ENCOUNTER — Inpatient Hospital Stay: Payer: Medicare Other

## 2020-04-01 VITALS — BP 144/59 | HR 63 | Temp 96.4°F | Resp 20

## 2020-04-01 DIAGNOSIS — D649 Anemia, unspecified: Secondary | ICD-10-CM

## 2020-04-01 DIAGNOSIS — D696 Thrombocytopenia, unspecified: Secondary | ICD-10-CM | POA: Diagnosis not present

## 2020-04-01 DIAGNOSIS — N189 Chronic kidney disease, unspecified: Secondary | ICD-10-CM

## 2020-04-01 MED ORDER — SODIUM CHLORIDE 0.9 % IV SOLN: 200.0000 mg | Freq: Once | INTRAVENOUS | Status: DC

## 2020-04-01 MED ORDER — SODIUM CHLORIDE 0.9 % IV SOLN
Freq: Once | INTRAVENOUS | Status: AC
  Filled 2020-04-01: qty 250

## 2020-04-01 MED ORDER — IRON SUCROSE 20 MG/ML IV SOLN
200.0000 mg | Freq: Once | INTRAVENOUS | Status: AC
  Administered 2020-04-01: 200 mg via INTRAVENOUS
  Filled 2020-04-01: qty 10

## 2020-04-08 ENCOUNTER — Other Ambulatory Visit: Payer: Self-pay

## 2020-04-08 ENCOUNTER — Inpatient Hospital Stay: Payer: Medicare Other

## 2020-04-08 VITALS — BP 134/60 | HR 70 | Resp 20

## 2020-04-08 DIAGNOSIS — D696 Thrombocytopenia, unspecified: Secondary | ICD-10-CM | POA: Diagnosis not present

## 2020-04-08 DIAGNOSIS — D649 Anemia, unspecified: Secondary | ICD-10-CM

## 2020-04-08 DIAGNOSIS — N189 Chronic kidney disease, unspecified: Secondary | ICD-10-CM

## 2020-04-08 MED ORDER — SODIUM CHLORIDE 0.9 % IV SOLN: 200.0000 mg | Freq: Once | INTRAVENOUS | Status: DC

## 2020-04-08 MED ORDER — SODIUM CHLORIDE 0.9 % IV SOLN
Freq: Once | INTRAVENOUS | Status: AC
  Filled 2020-04-08: qty 250

## 2020-04-08 MED ORDER — IRON SUCROSE 20 MG/ML IV SOLN
200.0000 mg | Freq: Once | INTRAVENOUS | Status: AC
  Administered 2020-04-08: 200 mg via INTRAVENOUS
  Filled 2020-04-08: qty 10

## 2020-04-15 ENCOUNTER — Other Ambulatory Visit: Payer: Self-pay

## 2020-04-15 ENCOUNTER — Inpatient Hospital Stay: Payer: Medicare Other

## 2020-04-15 VITALS — BP 150/65 | HR 69 | Temp 97.0°F | Resp 20

## 2020-04-15 DIAGNOSIS — D649 Anemia, unspecified: Secondary | ICD-10-CM

## 2020-04-15 DIAGNOSIS — N189 Chronic kidney disease, unspecified: Secondary | ICD-10-CM

## 2020-04-15 DIAGNOSIS — E876 Hypokalemia: Secondary | ICD-10-CM | POA: Diagnosis not present

## 2020-04-15 DIAGNOSIS — I132 Hypertensive heart and chronic kidney disease with heart failure and with stage 5 chronic kidney disease, or end stage renal disease: Secondary | ICD-10-CM | POA: Diagnosis not present

## 2020-04-15 MED ORDER — IRON SUCROSE 20 MG/ML IV SOLN
200.0000 mg | Freq: Once | INTRAVENOUS | Status: AC
  Administered 2020-04-15: 200 mg via INTRAVENOUS
  Filled 2020-04-15: qty 10

## 2020-04-15 MED ORDER — SODIUM CHLORIDE 0.9 % IV SOLN
Freq: Once | INTRAVENOUS | Status: AC
  Filled 2020-04-15: qty 250

## 2020-04-15 MED ORDER — SODIUM CHLORIDE 0.9 % IV SOLN: 200.0000 mg | Freq: Once | INTRAVENOUS | Status: DC

## 2020-04-17 ENCOUNTER — Other Ambulatory Visit: Payer: Self-pay

## 2020-04-17 ENCOUNTER — Emergency Department: Payer: Medicare Other

## 2020-04-17 ENCOUNTER — Inpatient Hospital Stay
Admission: EM | Admit: 2020-04-17 | Discharge: 2020-04-22 | DRG: 291 | Disposition: A | Discharge status: Skilled Nursing Facility | Payer: Medicare Other | Attending: Internal Medicine | Admitting: Internal Medicine

## 2020-04-17 DIAGNOSIS — E785 Hyperlipidemia, unspecified: Secondary | ICD-10-CM | POA: Diagnosis present

## 2020-04-17 DIAGNOSIS — M109 Gout, unspecified: Secondary | ICD-10-CM | POA: Diagnosis present

## 2020-04-17 DIAGNOSIS — E669 Obesity, unspecified: Secondary | ICD-10-CM | POA: Diagnosis present

## 2020-04-17 DIAGNOSIS — K649 Unspecified hemorrhoids: Secondary | ICD-10-CM | POA: Diagnosis present

## 2020-04-17 DIAGNOSIS — Z881 Allergy status to other antibiotic agents status: Secondary | ICD-10-CM

## 2020-04-17 DIAGNOSIS — G2581 Restless legs syndrome: Secondary | ICD-10-CM | POA: Diagnosis present

## 2020-04-17 DIAGNOSIS — Z8249 Family history of ischemic heart disease and other diseases of the circulatory system: Secondary | ICD-10-CM

## 2020-04-17 DIAGNOSIS — E1142 Type 2 diabetes mellitus with diabetic polyneuropathy: Secondary | ICD-10-CM | POA: Diagnosis present

## 2020-04-17 DIAGNOSIS — Z888 Allergy status to other drugs, medicaments and biological substances status: Secondary | ICD-10-CM

## 2020-04-17 DIAGNOSIS — Z955 Presence of coronary angioplasty implant and graft: Secondary | ICD-10-CM

## 2020-04-17 DIAGNOSIS — Z803 Family history of malignant neoplasm of breast: Secondary | ICD-10-CM

## 2020-04-17 DIAGNOSIS — E876 Hypokalemia: Secondary | ICD-10-CM | POA: Diagnosis present

## 2020-04-17 DIAGNOSIS — N2581 Secondary hyperparathyroidism of renal origin: Secondary | ICD-10-CM | POA: Diagnosis present

## 2020-04-17 DIAGNOSIS — Z951 Presence of aortocoronary bypass graft: Secondary | ICD-10-CM

## 2020-04-17 DIAGNOSIS — I509 Heart failure, unspecified: Secondary | ICD-10-CM | POA: Diagnosis not present

## 2020-04-17 DIAGNOSIS — K922 Gastrointestinal hemorrhage, unspecified: Secondary | ICD-10-CM

## 2020-04-17 DIAGNOSIS — Z6833 Body mass index (BMI) 33.0-33.9, adult: Secondary | ICD-10-CM

## 2020-04-17 DIAGNOSIS — H9190 Unspecified hearing loss, unspecified ear: Secondary | ICD-10-CM | POA: Diagnosis present

## 2020-04-17 DIAGNOSIS — Z833 Family history of diabetes mellitus: Secondary | ICD-10-CM

## 2020-04-17 DIAGNOSIS — I5033 Acute on chronic diastolic (congestive) heart failure: Secondary | ICD-10-CM | POA: Diagnosis present

## 2020-04-17 DIAGNOSIS — Z862 Personal history of diseases of the blood and blood-forming organs and certain disorders involving the immune mechanism: Secondary | ICD-10-CM | POA: Diagnosis not present

## 2020-04-17 DIAGNOSIS — I2510 Atherosclerotic heart disease of native coronary artery without angina pectoris: Secondary | ICD-10-CM | POA: Diagnosis present

## 2020-04-17 DIAGNOSIS — E1122 Type 2 diabetes mellitus with diabetic chronic kidney disease: Secondary | ICD-10-CM | POA: Diagnosis present

## 2020-04-17 DIAGNOSIS — N179 Acute kidney failure, unspecified: Secondary | ICD-10-CM | POA: Diagnosis not present

## 2020-04-17 DIAGNOSIS — E11319 Type 2 diabetes mellitus with unspecified diabetic retinopathy without macular edema: Secondary | ICD-10-CM | POA: Diagnosis present

## 2020-04-17 DIAGNOSIS — Z841 Family history of disorders of kidney and ureter: Secondary | ICD-10-CM

## 2020-04-17 DIAGNOSIS — E1165 Type 2 diabetes mellitus with hyperglycemia: Secondary | ICD-10-CM | POA: Diagnosis present

## 2020-04-17 DIAGNOSIS — I5031 Acute diastolic (congestive) heart failure: Secondary | ICD-10-CM | POA: Diagnosis not present

## 2020-04-17 DIAGNOSIS — Z20822 Contact with and (suspected) exposure to covid-19: Secondary | ICD-10-CM | POA: Diagnosis present

## 2020-04-17 DIAGNOSIS — D649 Anemia, unspecified: Secondary | ICD-10-CM

## 2020-04-17 DIAGNOSIS — Z87891 Personal history of nicotine dependence: Secondary | ICD-10-CM | POA: Diagnosis not present

## 2020-04-17 DIAGNOSIS — N186 End stage renal disease: Secondary | ICD-10-CM

## 2020-04-17 DIAGNOSIS — N189 Chronic kidney disease, unspecified: Secondary | ICD-10-CM | POA: Diagnosis not present

## 2020-04-17 DIAGNOSIS — D696 Thrombocytopenia, unspecified: Secondary | ICD-10-CM | POA: Diagnosis present

## 2020-04-17 DIAGNOSIS — N185 Chronic kidney disease, stage 5: Secondary | ICD-10-CM | POA: Diagnosis not present

## 2020-04-17 DIAGNOSIS — Z794 Long term (current) use of insulin: Secondary | ICD-10-CM

## 2020-04-17 DIAGNOSIS — K219 Gastro-esophageal reflux disease without esophagitis: Secondary | ICD-10-CM | POA: Diagnosis present

## 2020-04-17 DIAGNOSIS — D62 Acute posthemorrhagic anemia: Secondary | ICD-10-CM | POA: Diagnosis present

## 2020-04-17 DIAGNOSIS — I132 Hypertensive heart and chronic kidney disease with heart failure and with stage 5 chronic kidney disease, or end stage renal disease: Principal | ICD-10-CM | POA: Diagnosis present

## 2020-04-17 DIAGNOSIS — Z882 Allergy status to sulfonamides status: Secondary | ICD-10-CM

## 2020-04-17 DIAGNOSIS — D631 Anemia in chronic kidney disease: Secondary | ICD-10-CM | POA: Diagnosis present

## 2020-04-17 DIAGNOSIS — Z79899 Other long term (current) drug therapy: Secondary | ICD-10-CM | POA: Diagnosis not present

## 2020-04-17 DIAGNOSIS — I10 Essential (primary) hypertension: Secondary | ICD-10-CM | POA: Diagnosis not present

## 2020-04-17 DIAGNOSIS — Z992 Dependence on renal dialysis: Secondary | ICD-10-CM

## 2020-04-17 LAB — CBC
HCT: 25.6 % — ABNORMAL LOW (ref 36.0–46.0)
Hemoglobin: 8.8 g/dL — ABNORMAL LOW (ref 12.0–15.0)
MCH: 32.4 pg (ref 26.0–34.0)
MCHC: 34.4 g/dL (ref 30.0–36.0)
MCV: 94.1 fL (ref 80.0–100.0)
Platelets: 99 10*3/uL — ABNORMAL LOW (ref 150–400)
RBC: 2.72 MIL/uL — ABNORMAL LOW (ref 3.87–5.11)
RDW: 16.7 % — ABNORMAL HIGH (ref 11.5–15.5)
WBC: 7.6 10*3/uL (ref 4.0–10.5)
nRBC: 0 % (ref 0.0–0.2)

## 2020-04-17 LAB — COMPREHENSIVE METABOLIC PANEL
ALT: 26 U/L (ref 0–44)
AST: 34 U/L (ref 15–41)
Albumin: 3.9 g/dL (ref 3.5–5.0)
Alkaline Phosphatase: 67 U/L (ref 38–126)
Anion gap: 7 (ref 5–15)
BUN: 90 mg/dL — ABNORMAL HIGH (ref 8–23)
CO2: 20 mmol/L — ABNORMAL LOW (ref 22–32)
Calcium: 9.5 mg/dL (ref 8.9–10.3)
Chloride: 115 mmol/L — ABNORMAL HIGH (ref 98–111)
Creatinine, Ser: 3.81 mg/dL — ABNORMAL HIGH (ref 0.44–1.00)
GFR calc Af Amer: 12 mL/min — ABNORMAL LOW (ref >60)
GFR calc non Af Amer: 10 mL/min — ABNORMAL LOW (ref >60)
Glucose, Bld: 142 mg/dL — ABNORMAL HIGH (ref 70–99)
Potassium: 3.4 mmol/L — ABNORMAL LOW (ref 3.5–5.1)
Sodium: 142 mmol/L (ref 135–145)
Total Bilirubin: 0.8 mg/dL (ref 0.3–1.2)
Total Protein: 7 g/dL (ref 6.5–8.1)

## 2020-04-17 LAB — LACTIC ACID, PLASMA: Lactic Acid, Venous: 1.4 mmol/L (ref 0.5–1.9)

## 2020-04-17 LAB — RESPIRATORY PANEL BY RT PCR (FLU A&B, COVID)
Influenza A by PCR: NEGATIVE
Influenza B by PCR: NEGATIVE
SARS Coronavirus 2 by RT PCR: NEGATIVE

## 2020-04-17 LAB — BRAIN NATRIURETIC PEPTIDE: B Natriuretic Peptide: 378.3 pg/mL — ABNORMAL HIGH (ref 0.0–100.0)

## 2020-04-17 LAB — BLOOD GAS, VENOUS
Acid-base deficit: 8.4 mmol/L — ABNORMAL HIGH (ref 0.0–2.0)
Bicarbonate: 17.2 mmol/L — ABNORMAL LOW (ref 20.0–28.0)
O2 Saturation: 85.8 %
Patient temperature: 37
pCO2, Ven: 35 mmHg — ABNORMAL LOW (ref 44.0–60.0)
pH, Ven: 7.3 (ref 7.250–7.430)
pO2, Ven: 57 mmHg — ABNORMAL HIGH (ref 32.0–45.0)

## 2020-04-17 LAB — TROPONIN I (HIGH SENSITIVITY): Troponin I (High Sensitivity): 29 ng/L — ABNORMAL HIGH (ref <18)

## 2020-04-17 LAB — MAGNESIUM: Magnesium: 1.9 mg/dL (ref 1.7–2.4)

## 2020-04-17 MED ORDER — TRAZODONE HCL 50 MG PO TABS
25.0000 mg | ORAL_TABLET | Freq: Every evening | ORAL | Status: DC | PRN
  Administered 2020-04-18 – 2020-04-21 (×2): 25 mg via ORAL
  Filled 2020-04-17 (×3): qty 1

## 2020-04-17 MED ORDER — ONDANSETRON HCL 4 MG/2ML IJ SOLN: 4.0000 mg | Freq: Four times a day (QID) | INTRAMUSCULAR | Status: DC | PRN

## 2020-04-17 MED ORDER — ALLOPURINOL 100 MG PO TABS
200.0000 mg | ORAL_TABLET | Freq: Every day | ORAL | Status: DC
  Filled 2020-04-17: qty 2

## 2020-04-17 MED ORDER — HEPARIN SODIUM (PORCINE) 5000 UNIT/ML IJ SOLN
5000.0000 [IU] | Freq: Three times a day (TID) | INTRAMUSCULAR | Status: DC
  Administered 2020-04-18 – 2020-04-22 (×8): 5000 [IU] via SUBCUTANEOUS
  Filled 2020-04-17 (×8): qty 1

## 2020-04-17 MED ORDER — TRAMADOL HCL 50 MG PO TABS
50.0000 mg | ORAL_TABLET | Freq: Two times a day (BID) | ORAL | Status: DC | PRN
  Administered 2020-04-21: 50 mg via ORAL
  Filled 2020-04-17: qty 1

## 2020-04-17 MED ORDER — METOPROLOL TARTRATE 25 MG PO TABS
25.0000 mg | ORAL_TABLET | Freq: Two times a day (BID) | ORAL | Status: DC
  Administered 2020-04-18 – 2020-04-22 (×8): 25 mg via ORAL
  Filled 2020-04-17 (×8): qty 1

## 2020-04-17 MED ORDER — FUROSEMIDE 10 MG/ML IJ SOLN
60.0000 mg | Freq: Two times a day (BID) | INTRAMUSCULAR | Status: DC
  Administered 2020-04-18 – 2020-04-22 (×9): 60 mg via INTRAVENOUS
  Filled 2020-04-17 (×2): qty 6
  Filled 2020-04-17: qty 8
  Filled 2020-04-17 (×6): qty 6

## 2020-04-17 MED ORDER — DICYCLOMINE HCL 10 MG PO CAPS
10.0000 mg | ORAL_CAPSULE | Freq: Four times a day (QID) | ORAL | Status: DC | PRN
  Administered 2020-04-22: 10 mg via ORAL
  Filled 2020-04-17 (×3): qty 1

## 2020-04-17 MED ORDER — LORAZEPAM 0.5 MG PO TABS: 0.5000 mg | ORAL_TABLET | Freq: Four times a day (QID) | ORAL | Status: DC | PRN

## 2020-04-17 MED ORDER — AMLODIPINE BESYLATE 5 MG PO TABS
5.0000 mg | ORAL_TABLET | Freq: Every day | ORAL | Status: DC
  Administered 2020-04-18 – 2020-04-19 (×2): 5 mg via ORAL
  Filled 2020-04-17 (×2): qty 1

## 2020-04-17 MED ORDER — CYANOCOBALAMIN 1000 MCG/ML IJ KIT: PACK | INTRAMUSCULAR | Status: DC

## 2020-04-17 MED ORDER — FUROSEMIDE 10 MG/ML IJ SOLN
80.0000 mg | Freq: Once | INTRAMUSCULAR | Status: AC
  Administered 2020-04-17: 80 mg via INTRAVENOUS
  Filled 2020-04-17: qty 8

## 2020-04-17 MED ORDER — POTASSIUM CHLORIDE CRYS ER 20 MEQ PO TBCR
40.0000 meq | EXTENDED_RELEASE_TABLET | Freq: Once | ORAL | Status: AC
  Administered 2020-04-18: 40 meq via ORAL
  Filled 2020-04-17: qty 2

## 2020-04-17 MED ORDER — ROPINIROLE HCL 1 MG PO TABS
0.5000 mg | ORAL_TABLET | Freq: Every day | ORAL | Status: DC
  Administered 2020-04-18 – 2020-04-21 (×5): 0.5 mg via ORAL
  Filled 2020-04-17: qty 1
  Filled 2020-04-17 (×2): qty 2
  Filled 2020-04-17 (×3): qty 1

## 2020-04-17 MED ORDER — VITAMIN D (ERGOCALCIFEROL) 1.25 MG (50000 UNIT) PO CAPS: 50000.0000 [IU] | ORAL_CAPSULE | ORAL | Status: DC

## 2020-04-17 MED ORDER — SERTRALINE HCL 50 MG PO TABS
50.0000 mg | ORAL_TABLET | Freq: Every day | ORAL | Status: DC
  Administered 2020-04-18 – 2020-04-22 (×5): 50 mg via ORAL
  Filled 2020-04-17 (×5): qty 1

## 2020-04-17 MED ORDER — INSULIN GLARGINE 100 UNIT/ML ~~LOC~~ SOLN
48.0000 [IU] | Freq: Every day | SUBCUTANEOUS | Status: DC
  Administered 2020-04-18 – 2020-04-22 (×4): 48 [IU] via SUBCUTANEOUS
  Filled 2020-04-17 (×6): qty 0.48

## 2020-04-17 MED ORDER — ACETAMINOPHEN 650 MG RE SUPP: 650.0000 mg | Freq: Four times a day (QID) | RECTAL | Status: DC | PRN

## 2020-04-17 MED ORDER — GABAPENTIN 300 MG PO CAPS
300.0000 mg | ORAL_CAPSULE | Freq: Three times a day (TID) | ORAL | Status: DC
  Administered 2020-04-18 (×2): 300 mg via ORAL
  Filled 2020-04-17 (×2): qty 1

## 2020-04-17 MED ORDER — LORATADINE 10 MG PO TABS
10.0000 mg | ORAL_TABLET | Freq: Every day | ORAL | Status: DC
  Administered 2020-04-18 – 2020-04-22 (×5): 10 mg via ORAL
  Filled 2020-04-17 (×5): qty 1

## 2020-04-17 MED ORDER — ACETAMINOPHEN 325 MG PO TABS
650.0000 mg | ORAL_TABLET | Freq: Four times a day (QID) | ORAL | Status: DC | PRN
  Administered 2020-04-18 – 2020-04-19 (×2): 650 mg via ORAL
  Filled 2020-04-17 (×2): qty 2

## 2020-04-17 MED ORDER — ALBUTEROL SULFATE (2.5 MG/3ML) 0.083% IN NEBU: 3.0000 mL | INHALATION_SOLUTION | Freq: Four times a day (QID) | RESPIRATORY_TRACT | Status: DC | PRN

## 2020-04-17 MED ORDER — SUCRALFATE 1 G PO TABS
1.0000 g | ORAL_TABLET | Freq: Two times a day (BID) | ORAL | Status: DC
  Administered 2020-04-18 – 2020-04-22 (×9): 1 g via ORAL
  Filled 2020-04-17 (×9): qty 1

## 2020-04-17 MED ORDER — TRIAMCINOLONE ACETONIDE 55 MCG/ACT NA AERO: 2.0000 | INHALATION_SPRAY | Freq: Every day | NASAL | Status: DC

## 2020-04-17 MED ORDER — PANTOPRAZOLE SODIUM 40 MG PO TBEC
40.0000 mg | DELAYED_RELEASE_TABLET | Freq: Every day | ORAL | Status: DC
  Administered 2020-04-18 – 2020-04-22 (×5): 40 mg via ORAL
  Filled 2020-04-17 (×5): qty 1

## 2020-04-17 MED ORDER — NITROGLYCERIN 0.4 MG SL SUBL: 0.4000 mg | SUBLINGUAL_TABLET | SUBLINGUAL | Status: DC | PRN

## 2020-04-17 MED ORDER — FENOFIBRATE 54 MG PO TABS: 54.0000 mg | ORAL_TABLET | Freq: Every day | ORAL | Status: DC

## 2020-04-17 MED ORDER — FUROSEMIDE 10 MG/ML IJ SOLN: 60.0000 mg | Freq: Two times a day (BID) | INTRAMUSCULAR | Status: DC

## 2020-04-17 MED ORDER — POLYSACCHARIDE IRON COMPLEX 150 MG PO CAPS
150.0000 mg | ORAL_CAPSULE | Freq: Every day | ORAL | Status: DC
  Administered 2020-04-18 – 2020-04-22 (×5): 150 mg via ORAL
  Filled 2020-04-17 (×5): qty 1

## 2020-04-17 MED ORDER — ONDANSETRON HCL 4 MG PO TABS: 4.0000 mg | ORAL_TABLET | Freq: Four times a day (QID) | ORAL | Status: DC | PRN

## 2020-04-17 NOTE — ED Triage Notes (Signed)
Pt from home with shob. Pt with renal failure, chf history. Pt with periorbital edema noted, 3+ pedal edema noted. Pt with oxygen at 2lpm infusing.

## 2020-04-17 NOTE — ED Notes (Signed)
Pt states coming in for just not feeling right and shortness of breath. Pt is on oxygen at 4LPM and says "it feels good" pt states she does not use oxygen at home, just a CPAP.

## 2020-04-17 NOTE — H&P (Addendum)
[place]   PATIENT NAME: Katherine Hernandez    MR#:  [id number]  DATE OF BIRTH:  08-18-1929  DATE OF ADMISSION:  04/17/2020  PRIMARY CARE PHYSICIAN: Tracie Harrier, MD   REQUESTING/REFERRING PHYSICIAN: Hulan Saas, MD  CHIEF COMPLAINT:   Chief Complaint  Patient presents with  . Shortness of Breath    HISTORY OF PRESENT ILLNESS:  Katherine Hernandez  is a 86 y.o.  Caucasian female with a known history of stage V chronic kidney disease, diastolic CHF, coronary artery disease, type 2 diabetes mellitus, hypertension dyslipidemia, presented to the emergency room with acute onset of worsening dyspnea that started today as well as orthopnea and paroxysmal nocturnal dyspnea. She has been having worsening lower extremity edema as well as dry cough and wheezing. She denies any fever or chills. She denies any nausea or vomiting or abdominal pain. She admitted to diarrhea which has been intermittent over the last several days, occasionally watery and loose. She would notice minimal amount of blood when she wipes. No bright red bleeding per rectum or melanotic stools. No headache or dizziness or blurred vision.  Upon presentation to the emergency room, blood pressure was 81/63 with respiratory rate of 24 and otherwise normal vital signs.  Blood pressure later on was up to 171/60 then 168/61.  Labs revealed VBG with pH 7.3 bicarbonate of 17.2 and PCO2 of 35.  CMP remarkable for mild hypokalemia of 3.4 BUN of 90 and creatinine 3.81 compared to 74 and 3.61 on 03/17/2020.  Magnesium was 1.9 and LFTs were unremarkable.  High-sensitivity troponin I was 29 and CBC showed anemia close to her baseline with hemoglobin 8.8 and hematocrit 25.6.  Chest x-ray showed mild cardiomegaly with pulmonary vascular congestion.EKG showed sinus rhythm with rate of 76 with Q waves in V1, left atrial enlargement, lateral T wave inversion and prolonged QT interval with QTC of 505 MS.  The patient was given 80 mg IV Lasix and  40 mEq p.o. potassium chloride.  She will be admitted to a progressive unit bed for further evaluation and management. PAST MEDICAL HISTORY:   Past Medical History:  Diagnosis Date  . Arthritis   . CAD (coronary artery disease)    Last nuclear 02/2009 EF 72% and neg for ischemia   . CHF (congestive heart failure) ([place])   . CKD (chronic kidney disease) 03/17/2020  . Coronary artery disease    CAD with PTCA(cfx)fx -1993,and CABG ,2004  . Diabetes mellitus without complication ([place])   . Diabetic nephropathy ([place])   . Diabetic peripheral neuropathy ([place])   . GERD (gastroesophageal reflux disease)   . Hyperlipidemia   . Hypertension   . Obesity   . Retinopathy, diabetic, background ([place])   . RLS (restless legs syndrome)   . Thrombocytopenia ([place])   . Urinary incontinence     PAST SURGICAL HISTORY:   Past Surgical History:  Procedure Laterality Date  . ABDOMINAL HYSTERECTOMY    . BONE MARROW BIOPSY    . BREAST BIOPSY Left 1970   surgical exc  . BREAST BIOPSY Right 09/13/2016   path pending x 2 areas  . CHOLECYSTECTOMY  1968  . COLONOSCOPY WITH PROPOFOL N/A 01/02/2017   Procedure: COLONOSCOPY WITH PROPOFOL;  Surgeon: Lollie Sails, MD;  Location: [facility] ENDOSCOPY;  Service: Endoscopy;  Laterality: N/A;  . CORONARY ANGIOPLASTY    . CORONARY ARTERY BYPASS GRAFT  02/2003  . ery    . ESOPHAGOGASTRODUODENOSCOPY (EGD) WITH PROPOFOL N/A 01/02/2017   Procedure:  ESOPHAGOGASTRODUODENOSCOPY (EGD) WITH PROPOFOL;  Surgeon: Lollie Sails, MD;  Location: [facility] ENDOSCOPY;  Service: Endoscopy;  Laterality: N/A;    SOCIAL HISTORY:   Social History   Tobacco Use  . Smoking status: Former Smoker    Packs/day: 1.00    Years: 15.00    Pack years: 15.00    Types: Cigarettes    Quit date: 07/17/1978    Years since quitting: 41.7  . Smokeless tobacco: Never Used  . Tobacco comment: quit in year 1980  Substance Use Topics  . Alcohol use: No    FAMILY HISTORY:   Family History    Problem Relation Age of Onset  . Breast cancer Sister 52  . Breast cancer Sister   . Diabetes Mother   . Heart disease Mother   . Kidney disease Father   . Hypertension Father   . Fanconi anemia Sister     DRUG ALLERGIES:   Allergies  Allergen Reactions  . Amoxicillin Diarrhea  . Cefdinir Diarrhea  . Doxycycline Diarrhea  . Eliquis [Apixaban] Other (See Comments)    Dizziness   . Erythromycin Diarrhea  . Lisinopril Cough  . Macrodantin [Nitrofurantoin] Diarrhea  . Mobic [Meloxicam]     diarrhea  . Statins Other (See Comments)    Joint pain  . Sulfa Antibiotics Hives    REVIEW OF SYSTEMS:   ROS As per history of present illness. All pertinent systems were reviewed above. Constitutional, HEENT, cardiovascular, respiratory, GI, GU, musculoskeletal, neuro, psychiatric, endocrine, integumentary and hematologic systems were reviewed and are otherwise negative/unremarkable except for positive findings mentioned above in the HPI.   MEDICATIONS AT HOME:   Prior to Admission medications   Medication Sig Start Date End Date Taking? Authorizing Provider  albuterol (PROVENTIL HFA;VENTOLIN HFA) 108 (90 Base) MCG/ACT inhaler Inhale 2 puffs every 6 (six) hours as needed into the lungs for wheezing or shortness of breath.   Yes [provider]  allopurinol (ZYLOPRIM) 100 MG tablet Take 200 mg by mouth daily.  08/06/17  Yes [provider]  amLODipine (NORVASC) 10 MG tablet Take 10 mg by mouth daily.  02/26/20  Yes [provider]  cetirizine (ZYRTEC) 10 MG tablet Take 10 mg by mouth daily.   Yes [provider]  conjugated estrogens (PREMARIN) vaginal cream INSERT 0.5 GRAMS VAGINALLY  TWICE WEEKLY 10/15/19  Yes [provider]  Cyanocobalamin (B-12 COMPLIANCE INJECTION IJ) Inject as directed every 30 (thirty) days.   Yes [provider]  dicyclomine (BENTYL) 10 MG capsule Take 10 mg by mouth 4 (four) times daily as needed. 03/17/20  Yes  [provider]  ergocalciferol (VITAMIN D2) 50000 units capsule Take 50,000 Units by mouth once a week.   Yes [provider]  fenofibrate (TRICOR) 48 MG tablet Take by mouth. 10/13/19 10/12/20 Yes [provider]  gabapentin (NEURONTIN) 300 MG capsule Take 300 mg by mouth 3 (three) times daily. Pt doesn't always take 3 times a day   Yes [provider]  Insulin Glargine (LANTUS SOLOSTAR) 100 UNIT/ML Solostar Pen Inject 48 Units into the skin at bedtime.    Yes [provider]  iron polysaccharides (NIFEREX) 150 MG capsule Take 150 mg by mouth daily.  10/13/19  Yes [provider]  levocetirizine (XYZAL) 5 MG tablet SMARTSIG:1 Tablet(s) By Mouth Every Evening 01/14/20  Yes [provider]  liraglutide (VICTOZA) 18 MG/3ML SOPN Inject 1.8 mg into the skin daily.    Yes [provider]  LORazepam (ATIVAN)  0.5 MG tablet Take 0.5 mg by mouth daily as needed for anxiety.    Yes [provider]  losartan (COZAAR) 100 MG tablet Take 100 mg by mouth daily.   Yes [provider]  metoprolol tartrate (LOPRESSOR) 25 MG tablet Take 25 mg by mouth 2 (two) times daily.   Yes [provider]  nitroGLYCERIN (NITROSTAT) 0.4 MG SL tablet Place 0.4 mg under the tongue every 5 (five) minutes as needed for chest pain.    Yes [provider]  pantoprazole (PROTONIX) 40 MG tablet Take 40 mg by mouth daily.    Yes [provider]  rOPINIRole (REQUIP) 0.5 MG tablet Take 0.5 mg by mouth at bedtime.   Yes [provider]  sertraline (ZOLOFT) 50 MG tablet Take 50 mg by mouth daily.  10/23/17 04/17/20 Yes [provider]  sucralfate (CARAFATE) 1 g tablet Take 1 g by mouth 2 (two) times daily. 11/24/19  Yes [provider]  triamcinolone (NASACORT) 55 MCG/ACT AERO nasal inhaler 2 sprays daily. 11/24/19  Yes [provider]  Fluocinolone Acetonide Scalp 0.01 % OIL  11/13/17   [provider]  torsemide (DEMADEX) 100 MG tablet Take 100 mg by mouth daily. 03/13/20   [provider]  traMADol (ULTRAM) 50 MG tablet Take 50 mg by mouth every 12 (twelve) hours as needed.  01/09/18   [provider]      VITAL SIGNS:  Blood pressure (!) 164/68, pulse 72, temperature 98.1 F (36.7 C), resp. rate (!) 22, height '5\' 2"'  (1.575 m), weight 79.4 kg, SpO2 100 %.  PHYSICAL EXAMINATION:  Physical Exam  GENERAL:  86 y.o.-year-old patient semirecumbent in bed with mild conversational dyspnea. EYES: Pupils equal, round, reactive to light and accommodation. No scleral icterus. Extraocular muscles intact.  HEENT: Head atraumatic, normocephalic. Oropharynx and nasopharynx clear.  NECK:  Supple, no jugular venous distention. No thyroid enlargement, no tenderness.  LUNGS: Diminished bibasal breath sounds with bibasal rales. CARDIOVASCULAR: Regular rate and rhythm, S1, S2 normal. No murmurs, rubs, or gallops.  ABDOMEN: Soft, nondistended, nontender. Bowel sounds present. No organomegaly or mass.  EXTREMITIES: 3-4+ bilateral soft pedal and lower extremity edema with no cyanosis, or clubbing.  NEUROLOGIC: Cranial nerves II through XII are intact. Muscle strength 5/5 in all extremities. Sensation intact. Gait not checked.  PSYCHIATRIC: The patient is alert and oriented x 3.  Normal affect and good eye contact. SKIN: No obvious rash, lesion, or ulcer.   LABORATORY PANEL:   CBC Recent Labs  Lab 04/17/20 2204  WBC 7.6  HGB 8.8*  HCT 25.6*  PLT 99*   ------------------------------------------------------------------------------------------------------------------  Chemistries  Recent Labs  Lab 04/17/20 2204  NA 142  K 3.4*  CL 115*  CO2 20*  GLUCOSE 142*  BUN 90*  CREATININE 3.81*  CALCIUM 9.5  MG 1.9  AST 34  ALT 26  ALKPHOS 67  BILITOT 0.8    ------------------------------------------------------------------------------------------------------------------  Cardiac Enzymes No results for input(s): TROPONINI in the last 168 hours. ------------------------------------------------------------------------------------------------------------------  RADIOLOGY:  DG Chest 1 View  Result Date: 04/17/2020 CLINICAL DATA:  Shortness of breath and edema EXAM: CHEST  1 VIEW COMPARISON:  May 25, 2017 FINDINGS: The heart size and mediastinal contours are unchanged with mild cardiomegaly. Aortic knob calcifications are seen. Overlying median sternotomy wires are present. There is prominence of the central pulmonary vasculature. No large airspace consolidation or pleural effusion. No acute osseous abnormality. IMPRESSION: Mild cardiomegaly and pulmonary vascular congestion Electronically Signed  By: Prudencio Pair M.D.   On: 04/17/2020 22:27      IMPRESSION AND PLAN:   1.  Acute on chronic diastolic CHF. -The patient will be admitted to a progressive unit bed and will be diuresed with IV Lasix.  -Will follow serial troponin Is.  -Will obtain a 2D echo and a cardiology consultation in a.m. -I notified Dr. Clayborn Bigness about the patient.  2.  Acute kidney injury superimposed on chronic kidney disease, stage V.  This could be prerenal secondary to acute CHF and partly related to diarrhea. -The patient will be diuresed with IV Lasix and will follow her BMPs. -The patient had a plan to start hemodialysis in the next few weeks. -If she is not diuresis she may need it started here. -Nephrology consultation will be obtained. -I notified Dr. Holley Raring about the patient.  3.  Hypokalemia. -Potassium will be replaced.  4.  Suspected GI bleeding with subsequent to mild acute blood loss anemia on top of anemia of chronic kidney disease.  She has associated diarrhea. -We will follow serial hemoglobins and hematocrits. -We will obtain stool  pathogens. -Her abdomen is fairly benign and she had no fever or recent antibiotics.  I do not suspect C. difficile. -GI consultation will be obtained. -I notified Dr. Haig Prophet about the patient.  5.  Essential hypertension. -We will continue Norvasc and Lopressor and hold off Cozaar given acute kidney injury.  6.  Gout. -We will continue allopurinol.  7.  Dyslipidemia. -We will continue TriCor.  8.  Restless leg syndrome. -We will continue Requip.  9.  GERD. -We will continue PPI therapy.  10.  Type 2 diabetes mellitus. -We will place the patient on supplemental coverage with NovoLog and continue her basal coverage.  11.  DVT prophylaxis. -Subcutaneous heparin.   All the records are reviewed and case discussed with ED provider. The plan of care was discussed in details with the patient (and family). I answered all questions. The patient agreed to proceed with the above mentioned plan. Further management will depend upon hospital course.   CODE STATUS: Full code  Status is: Inpatient  Remains inpatient appropriate because:Ongoing diagnostic testing needed not appropriate for outpatient work up, Unsafe d/c plan, IV treatments appropriate due to intensity of illness or inability to take PO and Inpatient level of care appropriate due to severity of illness especially given multiple medical problems including her acute CHF and possible failure of diuresis and requirement for initiation of hemodialysis given significant fluid overload as well as GI bleeding in the setting of diarrhea.  This would very likely require more than 2 midnights of hospitalization.  Dispo: The patient is from: Home              Anticipated d/c is to: Home              Anticipated d/c date is: 2 days              Patient currently is not medically stable to d/c.   TOTAL TIME TAKING CARE OF THIS PATIENT: 60 minutes.    Christel Mormon M.D on 04/17/2020 at 11:59 PM  [facility]   From 7  PM-7 AM, contact night-coverage [email or web address]  CC: Primary care physician; Tracie Harrier, MD

## 2020-04-17 NOTE — ED Provider Notes (Signed)
[facility] Emergency Department Provider Note  ____________________________________________   First MD Initiated Contact with Patient 04/17/20 2211     (approximate)  I have reviewed the triage vital signs and the nursing notes.   HISTORY  Chief Complaint Shortness of Breath   HPI Katherine Hernandez is a 86 y.o. female with a past medical history of CAD, CHF, CKD, DM, HTN, HDL, obesity, thrombocytopenia, and RLS who presents for assessment of acute onset of some shortness of breath that began today.  Patient also endorses some nonbloody diarrhea.  She denies any chest pain or significant cough, abdominal pain, back pain, headache, earache, sore throat, fevers, chills, or other acute sick symptoms.  She does not normally wear oxygen at home but states the oxygen placed on her by EMS which improved her shortness of breath.  She states she was recently told her kidney function has been declining and she will require dialysis soon.  She also states she feels more swollen than usual today including around her eyes which is never happened before.  No clear alleviating or aggravating factors.  No prior similar episodes.         Past Medical History:  Diagnosis Date  . Arthritis   . CAD (coronary artery disease)    Last nuclear 02/2009 EF 72% and neg for ischemia   . CHF (congestive heart failure) ([place])   . CKD (chronic kidney disease) 03/17/2020  . Coronary artery disease    CAD with PTCA(cfx)fx -1993,and CABG ,2004  . Diabetes mellitus without complication ([place])   . Diabetic nephropathy ([place])   . Diabetic peripheral neuropathy ([place])   . GERD (gastroesophageal reflux disease)   . Hyperlipidemia   . Hypertension   . Obesity   . Retinopathy, diabetic, background ([place])   . RLS (restless legs syndrome)   . Thrombocytopenia ([place])   . Urinary incontinence     Patient Active Problem List   Diagnosis Date Noted  . Acute CHF (congestive heart failure) ([place])  04/17/2020  . CKD (chronic kidney disease) 03/17/2020  . MDS (myelodysplastic syndrome) ([place]) 12/03/2017  . Normocytic anemia 12/03/2017  . Thrombocytopenia ([place]) 12/03/2017  . HTN (hypertension) 06/07/2017  . Atrial fibrillation ([place]) 06/07/2017  . Diabetes ([place]) 06/07/2017  . Lymphedema 06/07/2017  . CHF (congestive heart failure) ([place]) 05/25/2017    Past Surgical History:  Procedure Laterality Date  . ABDOMINAL HYSTERECTOMY    . BONE MARROW BIOPSY    . BREAST BIOPSY Left 1970   surgical exc  . BREAST BIOPSY Right 09/13/2016   path pending x 2 areas  . CHOLECYSTECTOMY  1968  . COLONOSCOPY WITH PROPOFOL N/A 01/02/2017   Procedure: COLONOSCOPY WITH PROPOFOL;  Surgeon: Lollie Sails, MD;  Location: [facility] ENDOSCOPY;  Service: Endoscopy;  Laterality: N/A;  . CORONARY ANGIOPLASTY    . CORONARY ARTERY BYPASS GRAFT  02/2003  . ery    . ESOPHAGOGASTRODUODENOSCOPY (EGD) WITH PROPOFOL N/A 01/02/2017   Procedure: ESOPHAGOGASTRODUODENOSCOPY (EGD) WITH PROPOFOL;  Surgeon: Lollie Sails, MD;  Location: [facility] ENDOSCOPY;  Service: Endoscopy;  Laterality: N/A;    Prior to Admission medications   Medication Sig Start Date End Date Taking? Authorizing Provider  albuterol (PROVENTIL HFA;VENTOLIN HFA) 108 (90 Base) MCG/ACT inhaler Inhale 2 puffs every 6 (six) hours as needed into the lungs for wheezing or shortness of breath.    [provider]  allopurinol (ZYLOPRIM) 100 MG tablet Take 200 mg by mouth daily.  08/06/17   [provider]  amLODipine (NORVASC) 5 MG tablet Take 5 mg by mouth daily. 02/26/20   [provider]  cetirizine (ZYRTEC) 10 MG tablet Take 10 mg by mouth daily.    [provider]  colchicine 0.6 MG tablet Take 2 tablets (1.$RemoveBefo'2mg'wEkjNNfFien$ ) by mouth at first sign of gout flare followed by 1 tablet (0.$RemoveBef'6mg'OzGTKMjObd$ ) after 1 hour. (Max 1.$RemoveBefo'8mg'GlbePUjppwb$  within 1 hour) Patient not taking: Reported on 01/27/2020 10/13/19   [provider]  conjugated estrogens  (PREMARIN) vaginal cream INSERT 0.5 GRAMS VAGINALLY  TWICE WEEKLY 10/15/19   [provider]  Cyanocobalamin (B-12 COMPLIANCE INJECTION IJ) Inject as directed every 30 (thirty) days.    [provider]  dicyclomine (BENTYL) 10 MG capsule Take 10 mg by mouth 4 (four) times daily as needed. 03/17/20   [provider]  ergocalciferol (VITAMIN D2) 50000 units capsule Take 50,000 Units by mouth once a week.    [provider]  estradiol (ESTRACE) 0.1 MG/GM vaginal cream Place 1 Applicatorful vaginally 2 (two) times a week. Pt takes 2-3 times a week    [provider]  fenofibrate (TRICOR) 48 MG tablet Take by mouth. 10/13/19 10/12/20  [provider]  Fluocinolone Acetonide Scalp 0.01 % OIL  11/13/17   [provider]  gabapentin (NEURONTIN) 300 MG capsule Take 300 mg by mouth 3 (three) times daily. Pt doesn't always take 3 times a day    [provider]  Insulin Glargine (LANTUS SOLOSTAR) 100 UNIT/ML Solostar Pen Inject 48 Units into the skin at bedtime.     [provider]  iron polysaccharides (NIFEREX) 150 MG capsule Take by mouth. 10/13/19   [provider]  levocetirizine (XYZAL) 5 MG tablet SMARTSIG:1 Tablet(s) By Mouth Every Evening 01/14/20   [provider]  liraglutide (VICTOZA) 18 MG/3ML SOPN Inject 1.8 mg into the skin.    [provider]  LORazepam (ATIVAN) 0.5 MG tablet Take 0.5 mg by mouth every 8 (eight) hours as needed.     [provider]  losartan (COZAAR) 100 MG tablet Take 100 mg by mouth daily.    [provider]  metoprolol tartrate (LOPRESSOR) 25 MG tablet Take 25 mg by mouth 2 (two) times daily.    [provider]  nitroGLYCERIN (NITROSTAT) 0.4 MG SL tablet Place 0.4 mg under the tongue every 5 (five) minutes as needed for chest pain. Patient not taking: Reported on 01/27/2020    [provider]  pantoprazole (PROTONIX) 40 MG tablet Take 40 mg by  mouth daily.     [provider]  rOPINIRole (REQUIP) 0.5 MG tablet Take 0.5 mg by mouth at bedtime.    [provider]  sertraline (ZOLOFT) 50 MG tablet Take 50 mg by mouth daily.  10/23/17 03/19/20  [provider]  sucralfate (CARAFATE) 1 g tablet Take 1 g by mouth 2 (two) times daily. 11/24/19   [provider]  torsemide (DEMADEX) 100 MG tablet Take 100 mg by mouth daily. 03/13/20   [provider]  traMADol (ULTRAM) 50 MG tablet Take 50 mg by mouth every 12 (twelve) hours as needed.  01/09/18   [provider]  triamcinolone (NASACORT) 55 MCG/ACT AERO nasal inhaler 2 sprays daily. 11/24/19   [provider]    Allergies Amoxicillin, Cefdinir, Doxycycline, Eliquis [apixaban], Erythromycin, Lisinopril, Macrodantin [nitrofurantoin], Mobic [meloxicam], Statins, and Sulfa antibiotics  Family History  Problem Relation Age of Onset  . Breast cancer Sister 49  . Breast cancer Sister   .  Diabetes Mother   . Heart disease Mother   . Kidney disease Father   . Hypertension Father   . Fanconi anemia Sister     Social History Social History   Tobacco Use  . Smoking status: Former Smoker    Packs/day: 1.00    Years: 15.00    Pack years: 15.00    Types: Cigarettes    Quit date: 07/17/1978    Years since quitting: 41.7  . Smokeless tobacco: Never Used  . Tobacco comment: quit in year 1980  Vaping Use  . Vaping Use: Never used  Substance Use Topics  . Alcohol use: No  . Drug use: No    Review of Systems  Review of Systems  Constitutional: Positive for malaise/fatigue. Negative for chills and fever.  HENT: Negative for sore throat.   Eyes: Negative for pain.  Respiratory: Positive for shortness of breath. Negative for cough and stridor.   Cardiovascular: Negative for chest pain.  Gastrointestinal: Positive for diarrhea. Negative for vomiting.  Genitourinary: Negative for dysuria.  Musculoskeletal: Negative for myalgias.    Skin: Negative for rash.  Neurological: Negative for seizures, loss of consciousness and headaches.  Psychiatric/Behavioral: Negative for suicidal ideas.  All other systems reviewed and are negative.     ____________________________________________   PHYSICAL EXAM:  VITAL SIGNS: ED Triage Vitals  Enc Vitals Group     BP 04/17/20 2157 (!) 81/63     Pulse Rate 04/17/20 2157 76     Resp 04/17/20 2157 (!) 24     Temp 04/17/20 2157 98.1 F (36.7 C)     Temp src --      SpO2 04/17/20 2157 100 %     Weight 04/17/20 2158 175 lb (79.4 kg)     Height 04/17/20 2158 _0  (1.575 m)     Head Circumference --      Peak Flow --      Pain Score 04/17/20 2157 0     Pain Loc --      Pain Edu? --      Excl. in [place]? --    Vitals:   04/17/20 2157 04/17/20 2230  BP: (!) 81/63 (!) 171/60  Pulse: 76 74  Resp: (!) 24 (!) 21  Temp: 98.1 F (36.7 C)   SpO2: 100% 100%   Physical Exam Vitals and nursing note reviewed.  Constitutional:      General: She is not in acute distress.    Appearance: She is well-developed.  HENT:     Head: Normocephalic and atraumatic.     Right Ear: External ear normal.     Left Ear: External ear normal.     Nose: Nose normal.  Eyes:     Conjunctiva/sclera: Conjunctivae normal.  Cardiovascular:     Rate and Rhythm: Normal rate and regular rhythm.     Heart sounds: No murmur heard.   Pulmonary:     Effort: Pulmonary effort is normal. Tachypnea present. No respiratory distress.     Breath sounds: Decreased breath sounds present.  Abdominal:     Palpations: Abdomen is soft.     Tenderness: There is no abdominal tenderness.  Musculoskeletal:     Cervical back: Neck supple.     Right lower leg: Edema present.     Left lower leg: Edema present.  Skin:    General: Skin is warm and dry.  Neurological:     Mental Status: She is alert and oriented to person, place, and time.  Psychiatric:  Mood and Affect: Mood normal.       ____________________________________________   LABS (all labs ordered are listed, but only abnormal results are displayed)  Labs Reviewed  CBC - Abnormal; Notable for the following components:      Result Value   RBC 2.72 (*)    Hemoglobin 8.8 (*)    HCT 25.6 (*)    RDW 16.7 (*)    Platelets 99 (*)    All other components within normal limits  COMPREHENSIVE METABOLIC PANEL - Abnormal; Notable for the following components:   Potassium 3.4 (*)    Chloride 115 (*)    CO2 20 (*)    Glucose, Bld 142 (*)    BUN 90 (*)    Creatinine, Ser 3.81 (*)    GFR calc non Af Amer 10 (*)    GFR calc Af Amer 12 (*)    All other components within normal limits  BRAIN NATRIURETIC PEPTIDE - Abnormal; Notable for the following components:   B Natriuretic Peptide 378.3 (*)    All other components within normal limits  BLOOD GAS, VENOUS - Abnormal; Notable for the following components:   pCO2, Ven 35 (*)    pO2, Ven 57.0 (*)    Bicarbonate 17.2 (*)    Acid-base deficit 8.4 (*)    All other components within normal limits  TROPONIN I (HIGH SENSITIVITY) - Abnormal; Notable for the following components:   Troponin I (High Sensitivity) 29 (*)    All other components within normal limits  RESPIRATORY PANEL BY RT PCR (FLU A&B, COVID)  MAGNESIUM  PROCALCITONIN  LACTIC ACID, PLASMA  LACTIC ACID, PLASMA   ____________________________________________  EKG  Sinus rhythm with a ventricular rate of 76, normal axis, prolonged QTc interval at 505 with ST changes in the inferior and lateral leads that are nonspecific.  Overall somewhat poor tracing. ____________________________________________  RADIOLOGY  ED MD interpretation: Pulmonary congestion without clear focal consolidation, effusion, pneumothorax, or other clear acute intrathoracic process.  Official radiology report(s): DG Chest 1 View  Result Date: 04/17/2020 CLINICAL DATA:  Shortness of breath and edema EXAM: CHEST  1 VIEW COMPARISON:   May 25, 2017 FINDINGS: The heart size and mediastinal contours are unchanged with mild cardiomegaly. Aortic knob calcifications are seen. Overlying median sternotomy wires are present. There is prominence of the central pulmonary vasculature. No large airspace consolidation or pleural effusion. No acute osseous abnormality. IMPRESSION: Mild cardiomegaly and pulmonary vascular congestion Electronically Signed   By: Prudencio Pair M.D.   On: 04/17/2020 22:27    ____________________________________________   PROCEDURES  Procedure(s) performed (including Critical Care):  .1-3 Lead EKG Interpretation Performed by: Lucrezia Starch, MD Authorized by: Lucrezia Starch, MD     Interpretation: normal     ECG rate assessment: normal     Rhythm: sinus rhythm     Ectopy: none     Conduction: normal       ____________________________________________   INITIAL IMPRESSION / ASSESSMENT AND PLAN / ED COURSE        Patient presents with [place] to history exam for assessment of shortness of breath that began today associate with some diarrhea.  Patient is tachypneic with otherwise stable vital signs on 2 L placed for comfort on arrival.  Exam as above remarkable for some significant edema and decreased bilateral breath sounds and tachypnea.  Overall patient's history, exam, and initial ED work-up is concerning for acute onset shortness of breath likely related to acute on chronic heart  failure.  This is in the setting of significantly progressing CKD.  Presentation is not consistent with dissection as patient has no chest pain and her mediastinum does not appear widened on chest x-ray.  While she has some nonspecific changes on her EKG and her troponin is nonelevated will lower suspicion for acute thrombosis of time in this is more likely related to some mild demand ischemia versus her CKD although we will plan to obtain a second troponin.  Lower suspicion for PE as patient appears grossly volume  overloaded and patient is not hypoxic on room air.  She was given 2 L nasal cannula for comfort.  VBG shows mild metabolic acidosis with a pH of 7.3 and a bicarb of 17 and a PCO2 of 35.  No evidence of hypercarbic respiratory failure.  Covid sent.  CMP shows a K of 3.4 as well as a creatinine of 3.18 which is slightly elevated from 3.61 obtained 1 month ago although on review this has been uptrending over the last several months.  BNP is also elevated at 90.  No other significant metabolic derangements.  CBC shows a hemoglobin of 8.8 which is close to patient's baseline of 9.5 and evidence of thrombocytopenia with platelets of 99 this is also close to patient's baseline.  Low suspicion for acute symptomatic anemia given the level of hemoglobin and no history of bleeding.  Patient initially had a low blood pressure reading repeat readings showed slightly hypertensive.  Believe this is more likely given overall clinical picture.  IV Lasix ordered.  I will plan to admit to hospital service for further evaluation management.   ____________________________________________   FINAL CLINICAL IMPRESSION(S) / ED DIAGNOSES  Final diagnoses:  Acute on chronic congestive heart failure, unspecified heart failure type (HCC)  Hypokalemia  Low hemoglobin  Chronic kidney disease, unspecified CKD stage  Thrombocytopenia (HCC)    Medications  furosemide (LASIX) injection 80 mg (has no administration in time range)  potassium chloride SA (KLOR-CON) CR tablet 40 mEq (has no administration in time range)     ED Discharge Orders    None       Note:  This document was prepared using Dragon voice recognition software and may include unintentional dictation errors.   Lucrezia Starch, MD 04/17/20 [phone]

## 2020-04-18 ENCOUNTER — Encounter: Payer: Self-pay | Admitting: Family Medicine

## 2020-04-18 ENCOUNTER — Inpatient Hospital Stay
Admit: 2020-04-18 | Discharge: 2020-04-18 | Disposition: A | Discharge status: Home / Self Care | Payer: Medicare Other | Attending: Family Medicine | Admitting: Family Medicine

## 2020-04-18 DIAGNOSIS — N185 Chronic kidney disease, stage 5: Secondary | ICD-10-CM

## 2020-04-18 DIAGNOSIS — N189 Chronic kidney disease, unspecified: Secondary | ICD-10-CM | POA: Diagnosis not present

## 2020-04-18 DIAGNOSIS — I5031 Acute diastolic (congestive) heart failure: Secondary | ICD-10-CM

## 2020-04-18 DIAGNOSIS — Z862 Personal history of diseases of the blood and blood-forming organs and certain disorders involving the immune mechanism: Secondary | ICD-10-CM

## 2020-04-18 DIAGNOSIS — E876 Hypokalemia: Secondary | ICD-10-CM | POA: Diagnosis not present

## 2020-04-18 LAB — ECHOCARDIOGRAM COMPLETE
AR max vel: 1.32 cm2
AV Area VTI: 1.37 cm2
AV Area mean vel: 1.32 cm2
AV Mean grad: 8 mmHg
AV Peak grad: 16.2 mmHg
Ao pk vel: 2.01 m/s
Area-P 1/2: 3.99 cm2
Height: 62 in
S' Lateral: 2.55 cm
Weight: 2800 oz

## 2020-04-18 LAB — BASIC METABOLIC PANEL
Anion gap: 8 (ref 5–15)
BUN: 90 mg/dL — ABNORMAL HIGH (ref 8–23)
CO2: 17 mmol/L — ABNORMAL LOW (ref 22–32)
Calcium: 8.8 mg/dL — ABNORMAL LOW (ref 8.9–10.3)
Chloride: 117 mmol/L — ABNORMAL HIGH (ref 98–111)
Creatinine, Ser: 3.7 mg/dL — ABNORMAL HIGH (ref 0.44–1.00)
GFR calc Af Amer: 12 mL/min — ABNORMAL LOW (ref >60)
GFR calc non Af Amer: 10 mL/min — ABNORMAL LOW (ref >60)
Glucose, Bld: 98 mg/dL (ref 70–99)
Potassium: 3.5 mmol/L (ref 3.5–5.1)
Sodium: 142 mmol/L (ref 135–145)

## 2020-04-18 LAB — GASTROINTESTINAL PANEL BY PCR, STOOL (REPLACES STOOL CULTURE)

## 2020-04-18 LAB — PROCALCITONIN: Procalcitonin: <0.1 ng/mL

## 2020-04-18 LAB — TROPONIN I (HIGH SENSITIVITY): Troponin I (High Sensitivity): 38 ng/L — ABNORMAL HIGH (ref <18)

## 2020-04-18 LAB — LACTIC ACID, PLASMA: Lactic Acid, Venous: 1.4 mmol/L (ref 0.5–1.9)

## 2020-04-18 MED ORDER — TRIAMCINOLONE ACETONIDE 55 MCG/ACT NA AERO
2.0000 | INHALATION_SPRAY | Freq: Every day | NASAL | Status: DC | PRN
  Filled 2020-04-18: qty 21.6

## 2020-04-18 MED ORDER — GABAPENTIN 300 MG PO CAPS
300.0000 mg | ORAL_CAPSULE | Freq: Every day | ORAL | Status: DC
  Administered 2020-04-19 – 2020-04-21 (×3): 300 mg via ORAL
  Filled 2020-04-18 (×3): qty 1

## 2020-04-18 MED ORDER — ALLOPURINOL 100 MG PO TABS
100.0000 mg | ORAL_TABLET | Freq: Every day | ORAL | Status: DC
  Administered 2020-04-18 – 2020-04-22 (×5): 100 mg via ORAL
  Filled 2020-04-18 (×5): qty 1

## 2020-04-18 NOTE — Progress Notes (Signed)
1       PROGRESS NOTE    Katherine Hernandez  VEH:[id number] DOB: 05/03/1930 DOA: 04/17/2020 PCP: Tracie Harrier, MD   Brief Narrative:   Katherine Hernandez  is a 86 y.o. Caucasian femalewith a known history of stage V chronic kidney disease, diastolic CHF, coronary artery disease, type 2 diabetes mellitus, hypertension dyslipidemia admitted for acute onset of worsening dyspnea as well as orthopnea and paroxysmal nocturnal dyspnea. She has been having worsening lower extremity edema as well as dry cough and wheezing. She admitted to diarrhea which has been intermittent over the last several days, occasionally watery and loose. She would notice minimal amount of blood when she wipes. No bright red bleeding per rectum or melanotic stools.   While in the emergency room, blood pressure was 81/63 with respiratory rate of 24 and otherwise normal vital signs. Blood pressure later on was up to 171/60 then 168/61. CMP remarkable for mild hypokalemia of 3.4 BUN of 90 and creatinine 3.81 compared to 74 and 3.61 on 03/17/2020. CBC showed anemia close to her baseline with hemoglobin 8.8 and hematocrit 25.6. Chest x-ray showed mild cardiomegaly with pulmonary vascular congestion.  The patient was given 80 mg IV Lasix and 40 mEq p.o. potassium chloride.   Assessment & Plan:   Active Problems:   Acute CHF (congestive heart failure) ([place])  1. Acute on chronic diastolic CHF. -Continue IV Lasix 60 mg twice daily.  -Strict I's and O's, daily weight.  Monitor on telemetry -Cardiology consult with Dr. Clayborn Bigness  2. Acute kidney injury superimposed on chronic kidney disease, stage V.  This could be prerenal secondary to acute CHF and partly related to diarrhea. -Patient has uremic symptom will need permacath tomorrow to start planning for dialysis per nephrology -Vascular surgery aware, nephrology following  3. Hypokalemia. -Repleted and resolved  4.  Suspected GI bleeding with subsequent to mild acute  blood loss anemia on top of anemia of chronic kidney disease.  She has associated diarrhea. -Stable H&H.  No endovascular evaluation per GI  5. Essential hypertension. -continue Norvasc and Lopressor and hold off Cozaar given acute kidney injury.  6. Gout. -continue allopurinol.  7. Dyslipidemia. -continue TriCor.  8. Restless leg syndrome. - continue Requip.  9. GERD. -We will continue PPI therapy.  10. Type 2 diabetes mellitus. -Sliding scale insulin  11.  Secondary hyperparathyroidism: Due to underlying CKD   Body mass index is 32.01 kg/m.      DVT prophylaxis: heparin injection 5,000 Units Start: 04/17/20 2330     Code Status: Full Code Family Communication: Discussed with patient Disposition Plan: In next 4 to 5 days depending on nephrology evaluation and dialysis need. Status is: Inpatient  Remains inpatient appropriate because:Inpatient level of care appropriate due to severity of illness   Dispo: The patient is from: Home              Anticipated d/c is to: Home              Anticipated d/c date is: > 3 days              Patient currently is not medically stable to d/c.  Consultants:   Nephrology  Vascular surgery  GI  Cardiology   Subjective: Feels tired, loose stools.  Objective: Vitals:   04/18/20 0730 04/18/20 1000 04/18/20 1030 04/18/20 1100  BP: (!) 154/48 (!) 162/99 (!) 171/60 (!) 159/57  Pulse: 69 72 71 67  Resp: 17 20 16 18   Temp:  SpO2: 100% 100% 100% 100%  Weight:      Height:        Intake/Output Summary (Last 24 hours) at 04/18/2020 1453 Last data filed at 04/18/2020 1203 Gross per 24 hour  Intake --  Output 1450 ml  Net -1450 ml   Filed Weights   04/17/20 2158  Weight: 79.4 kg    Examination:  General exam: Appears calm and comfortable  Respiratory system: Clear to auscultation. Respiratory effort normal. Cardiovascular system: S1 & S2 heard, RRR. No JVD, murmurs, rubs, gallops or clicks. No  pedal edema. Gastrointestinal system: Abdomen is nondistended, soft and nontender. No organomegaly or masses felt. Normal bowel sounds heard. Central nervous system: Alert and oriented. No focal neurological deficits. Extremities: Symmetric 5 x 5 power. Skin: No rashes, lesions or ulcers Psychiatry: Judgement and insight appear normal. Mood & affect appropriate.     Data Reviewed: I have personally reviewed following labs and imaging studies  CBC: Recent Labs  Lab 04/17/20 2204  WBC 7.6  HGB 8.8*  HCT 25.6*  MCV 94.1  PLT 99*   Basic Metabolic Panel: Recent Labs  Lab 04/17/20 2204 04/18/20 0407  NA 142 142  K 3.4* 3.5  CL 115* 117*  CO2 20* 17*  GLUCOSE 142* 98  BUN 90* 90*  CREATININE 3.81* 3.70*  CALCIUM 9.5 8.8*  MG 1.9  --    GFR: Estimated Creatinine Clearance: 10.5 mL/min (A) (by C-G formula based on SCr of 3.7 mg/dL (H)). Liver Function Tests: Recent Labs  Lab 04/17/20 2204  AST 34  ALT 26  ALKPHOS 67  BILITOT 0.8  PROT 7.0  ALBUMIN 3.9   No results for input(s): LIPASE, AMYLASE in the last 168 hours. No results for input(s): AMMONIA in the last 168 hours. Coagulation Profile: No results for input(s): INR, PROTIME in the last 168 hours. Cardiac Enzymes: No results for input(s): CKTOTAL, CKMB, CKMBINDEX, TROPONINI in the last 168 hours. BNP (last 3 results) No results for input(s): PROBNP in the last 8760 hours. HbA1C: No results for input(s): HGBA1C in the last 72 hours. CBG: No results for input(s): GLUCAP in the last 168 hours. Lipid Profile: No results for input(s): CHOL, HDL, LDLCALC, TRIG, CHOLHDL, LDLDIRECT in the last 72 hours. Thyroid Function Tests: No results for input(s): TSH, T4TOTAL, FREET4, T3FREE, THYROIDAB in the last 72 hours. Anemia Panel: No results for input(s): VITAMINB12, FOLATE, FERRITIN, TIBC, IRON, RETICCTPCT in the last 72 hours. Sepsis Labs: Recent Labs  Lab 04/17/20 2204 04/18/20 0018  PROCALCITON <0.10  --     LATICACIDVEN 1.4 1.4    Recent Results (from the past 240 hour(s))  Respiratory Panel by RT PCR (Flu A&B, Covid) - Nasopharyngeal Swab     Status: None   Collection Time: 04/17/20 10:35 PM   Specimen: Nasopharyngeal Swab  Result Value Ref Range Status   SARS Coronavirus 2 by RT PCR NEGATIVE NEGATIVE Final    Comment: (NOTE) SARS-CoV-2 target nucleic acids are NOT DETECTED.  The SARS-CoV-2 RNA is generally detectable in upper respiratoy specimens during the acute phase of infection. The lowest concentration of SARS-CoV-2 viral copies this assay can detect is 131 copies/mL. A negative result does not preclude SARS-Cov-2 infection and should not be used as the sole basis for treatment or other patient management decisions. A negative result may occur with  improper specimen collection/handling, submission of specimen other than nasopharyngeal swab, presence of viral mutation(s) within the areas targeted by this assay, and inadequate number of viral  copies (<131 copies/mL). A negative result must be combined with clinical observations, patient history, and epidemiological information. The expected result is Negative.  Fact Sheet for Patients:  PinkCheek.be  Fact Sheet for Healthcare Providers:  GravelBags.it  This test is no t yet approved or cleared by the [place] FDA and  has been authorized for detection and/or diagnosis of SARS-CoV-2 by FDA under an Emergency Use Authorization (EUA). This EUA will remain  in effect (meaning this test can be used) for the duration of the COVID-19 declaration under Section 564(b)(1) of the Act, 21 U.S.C. section 360bbb-3(b)(1), unless the authorization is terminated or revoked sooner.     Influenza A by PCR NEGATIVE NEGATIVE Final   Influenza B by PCR NEGATIVE NEGATIVE Final    Comment: (NOTE) The Xpert Xpress SARS-CoV-2/FLU/RSV assay is intended as an aid in  the diagnosis of  influenza from Nasopharyngeal swab specimens and  should not be used as a sole basis for treatment. Nasal washings and  aspirates are unacceptable for Xpert Xpress SARS-CoV-2/FLU/RSV  testing.  Fact Sheet for Patients: PinkCheek.be  Fact Sheet for Healthcare Providers: GravelBags.it  This test is not yet approved or cleared by the [place] FDA and  has been authorized for detection and/or diagnosis of SARS-CoV-2 by  FDA under an Emergency Use Authorization (EUA). This EUA will remain  in effect (meaning this test can be used) for the duration of the  Covid-19 declaration under Section 564(b)(1) of the Act, 21  U.S.C. section 360bbb-3(b)(1), unless the authorization is  terminated or revoked. Performed at [facility], [address] [place], [place] [address]   Gastrointestinal Panel by PCR , Stool     Status: None   Collection Time: 04/18/20  9:49 AM   Specimen: Stool  Result Value Ref Range Status   Campylobacter species NOT DETECTED NOT DETECTED Final   Plesimonas shigelloides NOT DETECTED NOT DETECTED Final   Salmonella species NOT DETECTED NOT DETECTED Final   Yersinia enterocolitica NOT DETECTED NOT DETECTED Final   Vibrio species NOT DETECTED NOT DETECTED Final   Vibrio cholerae NOT DETECTED NOT DETECTED Final   Enteroaggregative E coli (EAEC) NOT DETECTED NOT DETECTED Final   Enteropathogenic E coli (EPEC) NOT DETECTED NOT DETECTED Final   Enterotoxigenic E coli (ETEC) NOT DETECTED NOT DETECTED Final   Shiga like toxin producing E coli (STEC) NOT DETECTED NOT DETECTED Final   Shigella/Enteroinvasive E coli (EIEC) NOT DETECTED NOT DETECTED Final   Cryptosporidium NOT DETECTED NOT DETECTED Final   Cyclospora cayetanensis NOT DETECTED NOT DETECTED Final   Entamoeba histolytica NOT DETECTED NOT DETECTED Final   Giardia lamblia NOT DETECTED NOT DETECTED Final   Adenovirus F40/41 NOT DETECTED NOT  DETECTED Final   Astrovirus NOT DETECTED NOT DETECTED Final   Norovirus GI/GII NOT DETECTED NOT DETECTED Final   Rotavirus A NOT DETECTED NOT DETECTED Final   Sapovirus (I, II, IV, and V) NOT DETECTED NOT DETECTED Final    Comment: Performed at [facility], [address] [place], [place] [address]         Radiology Studies: DG Chest 1 View  Result Date: 04/17/2020 CLINICAL DATA:  Shortness of breath and edema EXAM: CHEST  1 VIEW COMPARISON:  May 25, 2017 FINDINGS: The heart size and mediastinal contours are unchanged with mild cardiomegaly. Aortic knob calcifications are seen. Overlying median sternotomy wires are present. There is prominence of the central pulmonary vasculature. No large airspace consolidation or pleural effusion. No acute osseous  abnormality. IMPRESSION: Mild cardiomegaly and pulmonary vascular congestion Electronically Signed   By: Prudencio Pair M.D.   On: 04/17/2020 22:27   ECHOCARDIOGRAM COMPLETE  Result Date: 04/18/2020    ECHOCARDIOGRAM REPORT   Patient Name:   Katherine Hernandez [facility] Date of Exam: 04/18/2020 Medical Rec #:  [id number]      Height:       62.0 in Accession #:    [id number]     Weight:       175.0 lb Date of Birth:  1930-07-07      BSA:          1.806 m Patient Age:    17 years       BP:           134/61 mmHg Patient Gender: F              HR:           70 bpm. Exam Location:  [facility] Procedure: 2D Echo Indications:     CHF 428.31  History:         Patient has prior history of Echocardiogram examinations, most                  recent 05/26/2017.  Sonographer:     Arville Go RDCS Referring Phys:  [id number] [place] Diagnosing Phys: Yolonda Kida MD  Sonographer Comments: No subcostal window. IMPRESSIONS  1. Left ventricular ejection fraction, by estimation, is 50 to 55%. The left ventricle has low normal function. The left ventricle has no regional wall motion abnormalities. Left ventricular diastolic parameters were normal.  2. Right ventricular  systolic function is normal. The right ventricular size is normal.  3. The mitral valve is myxomatous. No evidence of mitral valve regurgitation. Mild mitral stenosis. Moderate to severe mitral annular calcification.  4. The aortic valve is grossly normal. Aortic valve regurgitation is not visualized. FINDINGS  Left Ventricle: Left ventricular ejection fraction, by estimation, is 50 to 55%. The left ventricle has low normal function. The left ventricle has no regional wall motion abnormalities. The left ventricular internal cavity size was normal in size. There is no left ventricular hypertrophy. Left ventricular diastolic parameters were normal. Right Ventricle: The right ventricular size is normal. No increase in right ventricular wall thickness. Right ventricular systolic function is normal. Left Atrium: Left atrial size was normal in size. Right Atrium: Right atrial size was normal in size. Pericardium: There is no evidence of pericardial effusion. Mitral Valve: The mitral valve is myxomatous. There is moderate calcification of the mitral valve leaflet(s). Mildly decreased mobility of the mitral valve leaflets. Moderate to severe mitral annular calcification. No evidence of mitral valve regurgitation. Mild mitral valve stenosis. MV peak gradient, 15.1 mmHg. The mean mitral valve gradient is 7.0 mmHg. Tricuspid Valve: The tricuspid valve is normal in structure. Tricuspid valve regurgitation is not demonstrated. Aortic Valve: The aortic valve is grossly normal. Aortic valve regurgitation is not visualized. Aortic valve mean gradient measures 8.0 mmHg. Aortic valve peak gradient measures 16.2 mmHg. Aortic valve area, by VTI measures 1.37 cm. Pulmonic Valve: The pulmonic valve was normal in structure. Pulmonic valve regurgitation is not visualized. Aorta: The ascending aorta was not well visualized. IAS/Shunts: No atrial level shunt detected by color flow Doppler.  LEFT VENTRICLE PLAX 2D LVIDd:         3.40 cm   Diastology LVIDs:         2.55 cm  LV e' medial:  4.79 cm/s LV PW:         1.61 cm  LV E/e' medial:  31.3 LV IVS:        1.33 cm  LV e' lateral:   7.72 cm/s LVOT diam:     1.90 cm  LV E/e' lateral: 19.4 LV SV:         68 LV SV Index:   38 LVOT Area:     2.84 cm  RIGHT VENTRICLE RV Basal diam:  2.74 cm RV S prime:     13.80 cm/s TAPSE (M-mode): 2.1 cm LEFT ATRIUM           Index       RIGHT ATRIUM           Index LA diam:      3.70 cm 2.05 cm/m  RA Area:     11.00 cm LA Vol (A2C): 36.4 ml 20.15 ml/m RA Volume:   21.90 ml  12.12 ml/m LA Vol (A4C): 50.0 ml 27.68 ml/m  AORTIC VALVE                    PULMONIC VALVE AV Area (Vmax):    1.32 cm     PV Vmax:       1.33 m/s AV Area (Vmean):   1.32 cm     PV Peak grad:  7.1 mmHg AV Area (VTI):     1.37 cm AV Vmax:           201.00 cm/s AV Vmean:          126.000 cm/s AV VTI:            0.496 m AV Peak Grad:      16.2 mmHg AV Mean Grad:      8.0 mmHg LVOT Vmax:         93.30 cm/s LVOT Vmean:        58.700 cm/s LVOT VTI:          0.239 m LVOT/AV VTI ratio: 0.48  AORTA Ao Root diam: 2.70 cm Ao Asc diam:  3.10 cm MITRAL VALVE                TRICUSPID VALVE MV Area (PHT): 3.99 cm     TV Peak grad:   30.0 mmHg MV Peak grad:  15.1 mmHg    TV Vmax:        2.74 m/s MV Mean grad:  7.0 mmHg MV Vmax:       1.94 m/s     SHUNTS MV Vmean:      127.0 cm/s   Systemic VTI:  0.24 m MV Decel Time: 190 msec     Systemic Diam: 1.90 cm MV E velocity: 150.00 cm/s MV A velocity: 140.00 cm/s MV E/A ratio:  1.07 Dwayne D Callwood MD Electronically signed by Yolonda Kida MD Signature Date/Time: 04/18/2020/11:49:38 AM    Final         Scheduled Meds: . allopurinol  100 mg Oral Daily  . amLODipine  5 mg Oral Daily  . furosemide  60 mg Intravenous Q12H  . [START ON 04/19/2020] gabapentin  300 mg Oral QHS  . heparin  5,000 Units Subcutaneous Q8H  . insulin glargine  48 Units Subcutaneous Daily  . iron polysaccharides  150 mg Oral Daily  . loratadine  10 mg Oral Daily  .  metoprolol tartrate  25 mg Oral BID  . pantoprazole  40 mg Oral Daily  . rOPINIRole  0.5 mg Oral QHS  . sertraline  50 mg Oral Daily  . sucralfate  1 g Oral BID  . [START ON 04/23/2020] Vitamin D (Ergocalciferol)  50,000 Units Oral Weekly   Continuous Infusions:   LOS: 1 day    Time spent: 35 minutes    Max Sane, MD [facility] Pager [phone]  If 7PM-7AM, please contact night-coverage [email or web address] Password [email or web address] 04/18/2020, 2:53 PM

## 2020-04-18 NOTE — ED Notes (Signed)
Ardith Dark, NP notified that pts troponin went from 29 to 35

## 2020-04-18 NOTE — Plan of Care (Signed)
  Problem: Education: Goal: Knowledge of General Education information will improve Description: Including pain rating scale, medication(s)/side effects and non-pharmacologic comfort measures Outcome: Progressing   Problem: Health Behavior/Discharge Planning: Goal: Ability to manage health-related needs will improve Outcome: Progressing   Problem: Clinical Measurements: Goal: Ability to maintain clinical measurements within normal limits will improve Outcome: Progressing Goal: Will remain free from infection Outcome: Progressing Goal: Diagnostic test results will improve Outcome: Progressing Goal: Respiratory complications will improve Outcome: Progressing Goal: Cardiovascular complication will be avoided Outcome: Progressing   Problem: Activity: Goal: Risk for activity intolerance will decrease Outcome: Progressing   Problem: Nutrition: Goal: Adequate nutrition will be maintained Outcome: Progressing   Problem: Coping: Goal: Level of anxiety will decrease Outcome: Progressing   Problem: Elimination: Goal: Will not experience complications related to bowel motility Outcome: Progressing Goal: Will not experience complications related to urinary retention Outcome: Progressing   Problem: Pain Managment: Goal: General experience of comfort will improve Outcome: Progressing   Problem: Safety: Goal: Ability to remain free from injury will improve Outcome: Progressing   Problem: Skin Integrity: Goal: Risk for impaired skin integrity will decrease Outcome: Progressing   Problem: Education: Goal: Knowledge of disease and its progression will improve Outcome: Progressing   Problem: Health Behavior/Discharge Planning: Goal: Ability to manage health-related needs will improve Outcome: Progressing   Problem: Clinical Measurements: Goal: Complications related to the disease process or treatment will be avoided or minimized Outcome: Progressing Goal: Dialysis access  will remain free of complications Outcome: Progressing   Problem: Activity: Goal: Activity intolerance will improve Outcome: Progressing   Problem: Fluid Volume: Goal: Fluid volume balance will be maintained or improved Outcome: Progressing   Problem: Nutritional: Goal: Ability to make appropriate dietary choices will improve Outcome: Progressing   Problem: Respiratory: Goal: Respiratory symptoms related to disease process will be avoided Outcome: Progressing   Problem: Self-Concept: Goal: Body image disturbance will be avoided or minimized Outcome: Progressing   Problem: Urinary Elimination: Goal: Progression of disease will be identified and treated Outcome: Progressing   Problem: Education: Goal: Ability to demonstrate management of disease process will improve Outcome: Progressing Goal: Ability to verbalize understanding of medication therapies will improve Outcome: Progressing Goal: Individualized Educational Video(s) Outcome: Progressing   Problem: Activity: Goal: Capacity to carry out activities will improve Outcome: Progressing   Problem: Cardiac: Goal: Ability to achieve and maintain adequate cardiopulmonary perfusion will improve Outcome: Progressing

## 2020-04-18 NOTE — ED Notes (Signed)
Pt noted to be laying on left arm with BP cuff on it.

## 2020-04-18 NOTE — Progress Notes (Signed)
[facility]  ROUNDING NOTE   Subjective:  Pt seen at bedside.  Well known to [place] from the office. Comes with weakness, fatigue, loose stools.   Has EGFR of 14 at baseline. We have been discussing HD as outpt.    Objective:  Vital signs in last 24 hours:  Temp:  [98.1 F (36.7 C)] 98.1 F (36.7 C) (10/02 2157) Pulse Rate:  [62-76] 67 (10/03 1100) Resp:  [14-24] 18 (10/03 1100) BP: (81-171)/(48-99) 159/57 (10/03 1100) SpO2:  [98 %-100 %] 100 % (10/03 1100) Weight:  [79.4 kg] 79.4 kg (10/02 2158)  Weight change:  Filed Weights   04/17/20 2158  Weight: 79.4 kg    Intake/Output: I/O last 3 completed shifts: In: -  Out: 300 [Urine:300]   Intake/Output this shift:  Total I/O In: -  Out: 1150 [Urine:1150]  Physical Exam: General:  No acute distress  Head:  Normocephalic, atraumatic. Moist oral mucosal membranes  Eyes:  Anicteric  Neck:  Supple  Lungs:   Basilar rales, normal effort  Heart:  S1S2 no rubs  Abdomen:   Soft, nontender, bowel sounds present  Extremities:  1+ peripheral edema.  Neurologic:  Awake, alert, following commands  Skin:  No lesions  Access:  None    Basic Metabolic Panel: Recent Labs  Lab 04/17/20 2204 04/18/20 0407  NA 142 142  K 3.4* 3.5  CL 115* 117*  CO2 20* 17*  GLUCOSE 142* 98  BUN 90* 90*  CREATININE 3.81* 3.70*  CALCIUM 9.5 8.8*  MG 1.9  --     Liver Function Tests: Recent Labs  Lab 04/17/20 2204  AST 34  ALT 26  ALKPHOS 67  BILITOT 0.8  PROT 7.0  ALBUMIN 3.9   No results for input(s): LIPASE, AMYLASE in the last 168 hours. No results for input(s): AMMONIA in the last 168 hours.  CBC: Recent Labs  Lab 04/17/20 2204  WBC 7.6  HGB 8.8*  HCT 25.6*  MCV 94.1  PLT 99*    Cardiac Enzymes: No results for input(s): CKTOTAL, CKMB, CKMBINDEX, TROPONINI in the last 168 hours.  BNP: Invalid input(s): POCBNP  CBG: No results for input(s): GLUCAP in the last 168 hours.  Microbiology: Results for  orders placed or performed during the hospital encounter of 04/17/20  Respiratory Panel by RT PCR (Flu A&B, Covid) - Nasopharyngeal Swab     Status: None   Collection Time: 04/17/20 10:35 PM   Specimen: Nasopharyngeal Swab  Result Value Ref Range Status   SARS Coronavirus 2 by RT PCR NEGATIVE NEGATIVE Final    Comment: (NOTE) SARS-CoV-2 target nucleic acids are NOT DETECTED.  The SARS-CoV-2 RNA is generally detectable in upper respiratoy specimens during the acute phase of infection. The lowest concentration of SARS-CoV-2 viral copies this assay can detect is 131 copies/mL. A negative result does not preclude SARS-Cov-2 infection and should not be used as the sole basis for treatment or other patient management decisions. A negative result may occur with  improper specimen collection/handling, submission of specimen other than nasopharyngeal swab, presence of viral mutation(s) within the areas targeted by this assay, and inadequate number of viral copies (<131 copies/mL). A negative result must be combined with clinical observations, patient history, and epidemiological information. The expected result is Negative.  Fact Sheet for Patients:  PinkCheek.be  Fact Sheet for Healthcare Providers:  GravelBags.it  This test is no t yet approved or cleared by the [place] FDA and  has been authorized for detection and/or  diagnosis of SARS-CoV-2 by FDA under an Emergency Use Authorization (EUA). This EUA will remain  in effect (meaning this test can be used) for the duration of the COVID-19 declaration under Section 564(b)(1) of the Act, 21 U.S.C. section 360bbb-3(b)(1), unless the authorization is terminated or revoked sooner.     Influenza A by PCR NEGATIVE NEGATIVE Final   Influenza B by PCR NEGATIVE NEGATIVE Final    Comment: (NOTE) The Xpert Xpress SARS-CoV-2/FLU/RSV assay is intended as an aid in  the diagnosis of  influenza from Nasopharyngeal swab specimens and  should not be used as a sole basis for treatment. Nasal washings and  aspirates are unacceptable for Xpert Xpress SARS-CoV-2/FLU/RSV  testing.  Fact Sheet for Patients: PinkCheek.be  Fact Sheet for Healthcare Providers: GravelBags.it  This test is not yet approved or cleared by the [place] FDA and  has been authorized for detection and/or diagnosis of SARS-CoV-2 by  FDA under an Emergency Use Authorization (EUA). This EUA will remain  in effect (meaning this test can be used) for the duration of the  Covid-19 declaration under Section 564(b)(1) of the Act, 21  U.S.C. section 360bbb-3(b)(1), unless the authorization is  terminated or revoked. Performed at [facility], [address] [place], [place] [address]   Gastrointestinal Panel by PCR , Stool     Status: None   Collection Time: 04/18/20  9:49 AM   Specimen: Stool  Result Value Ref Range Status   Campylobacter species NOT DETECTED NOT DETECTED Final   Plesimonas shigelloides NOT DETECTED NOT DETECTED Final   Salmonella species NOT DETECTED NOT DETECTED Final   Yersinia enterocolitica NOT DETECTED NOT DETECTED Final   Vibrio species NOT DETECTED NOT DETECTED Final   Vibrio cholerae NOT DETECTED NOT DETECTED Final   Enteroaggregative E coli (EAEC) NOT DETECTED NOT DETECTED Final   Enteropathogenic E coli (EPEC) NOT DETECTED NOT DETECTED Final   Enterotoxigenic E coli (ETEC) NOT DETECTED NOT DETECTED Final   Shiga like toxin producing E coli (STEC) NOT DETECTED NOT DETECTED Final   Shigella/Enteroinvasive E coli (EIEC) NOT DETECTED NOT DETECTED Final   Cryptosporidium NOT DETECTED NOT DETECTED Final   Cyclospora cayetanensis NOT DETECTED NOT DETECTED Final   Entamoeba histolytica NOT DETECTED NOT DETECTED Final   Giardia lamblia NOT DETECTED NOT DETECTED Final   Adenovirus F40/41 NOT DETECTED NOT  DETECTED Final   Astrovirus NOT DETECTED NOT DETECTED Final   Norovirus GI/GII NOT DETECTED NOT DETECTED Final   Rotavirus A NOT DETECTED NOT DETECTED Final   Sapovirus (I, II, IV, and V) NOT DETECTED NOT DETECTED Final    Comment: Performed at [facility], [address] [place], [place] [address]    Coagulation Studies: No results for input(s): LABPROT, INR in the last 72 hours.  Urinalysis: No results for input(s): COLORURINE, LABSPEC, PHURINE, GLUCOSEU, HGBUR, BILIRUBINUR, KETONESUR, PROTEINUR, UROBILINOGEN, NITRITE, LEUKOCYTESUR in the last 72 hours.  Invalid input(s): APPERANCEUR    Imaging: DG Chest 1 View  Result Date: 04/17/2020 CLINICAL DATA:  Shortness of breath and edema EXAM: CHEST  1 VIEW COMPARISON:  May 25, 2017 FINDINGS: The heart size and mediastinal contours are unchanged with mild cardiomegaly. Aortic knob calcifications are seen. Overlying median sternotomy wires are present. There is prominence of the central pulmonary vasculature. No large airspace consolidation or pleural effusion. No acute osseous abnormality. IMPRESSION: Mild cardiomegaly and pulmonary vascular congestion Electronically Signed   By: Prudencio Pair M.D.   On: 04/17/2020 22:27  ECHOCARDIOGRAM COMPLETE  Result Date: 04/18/2020    ECHOCARDIOGRAM REPORT   Patient Name:   Katherine Hernandez [facility] Date of Exam: 04/18/2020 Medical Rec #:  [id number]      Height:       62.0 in Accession #:    [id number]     Weight:       175.0 lb Date of Birth:  08/08/1929      BSA:          1.806 m Patient Age:    22 years       BP:           134/61 mmHg Patient Gender: F              HR:           70 bpm. Exam Location:  [facility] Procedure: 2D Echo Indications:     CHF 428.31  History:         Patient has prior history of Echocardiogram examinations, most                  recent 05/26/2017.  Sonographer:     Arville Go RDCS Referring Phys:  [id number] [place] Diagnosing Phys: Yolonda Kida MD  Sonographer Comments:  No subcostal window. IMPRESSIONS  1. Left ventricular ejection fraction, by estimation, is 50 to 55%. The left ventricle has low normal function. The left ventricle has no regional wall motion abnormalities. Left ventricular diastolic parameters were normal.  2. Right ventricular systolic function is normal. The right ventricular size is normal.  3. The mitral valve is myxomatous. No evidence of mitral valve regurgitation. Mild mitral stenosis. Moderate to severe mitral annular calcification.  4. The aortic valve is grossly normal. Aortic valve regurgitation is not visualized. FINDINGS  Left Ventricle: Left ventricular ejection fraction, by estimation, is 50 to 55%. The left ventricle has low normal function. The left ventricle has no regional wall motion abnormalities. The left ventricular internal cavity size was normal in size. There is no left ventricular hypertrophy. Left ventricular diastolic parameters were normal. Right Ventricle: The right ventricular size is normal. No increase in right ventricular wall thickness. Right ventricular systolic function is normal. Left Atrium: Left atrial size was normal in size. Right Atrium: Right atrial size was normal in size. Pericardium: There is no evidence of pericardial effusion. Mitral Valve: The mitral valve is myxomatous. There is moderate calcification of the mitral valve leaflet(s). Mildly decreased mobility of the mitral valve leaflets. Moderate to severe mitral annular calcification. No evidence of mitral valve regurgitation. Mild mitral valve stenosis. MV peak gradient, 15.1 mmHg. The mean mitral valve gradient is 7.0 mmHg. Tricuspid Valve: The tricuspid valve is normal in structure. Tricuspid valve regurgitation is not demonstrated. Aortic Valve: The aortic valve is grossly normal. Aortic valve regurgitation is not visualized. Aortic valve mean gradient measures 8.0 mmHg. Aortic valve peak gradient measures 16.2 mmHg. Aortic valve area, by VTI measures 1.37  cm. Pulmonic Valve: The pulmonic valve was normal in structure. Pulmonic valve regurgitation is not visualized. Aorta: The ascending aorta was not well visualized. IAS/Shunts: No atrial level shunt detected by color flow Doppler.  LEFT VENTRICLE PLAX 2D LVIDd:         3.40 cm  Diastology LVIDs:         2.55 cm  LV e' medial:    4.79 cm/s LV PW:         1.61 cm  LV E/e' medial:  31.3 LV IVS:  1.33 cm  LV e' lateral:   7.72 cm/s LVOT diam:     1.90 cm  LV E/e' lateral: 19.4 LV SV:         68 LV SV Index:   38 LVOT Area:     2.84 cm  RIGHT VENTRICLE RV Basal diam:  2.74 cm RV S prime:     13.80 cm/s TAPSE (M-mode): 2.1 cm LEFT ATRIUM           Index       RIGHT ATRIUM           Index LA diam:      3.70 cm 2.05 cm/m  RA Area:     11.00 cm LA Vol (A2C): 36.4 ml 20.15 ml/m RA Volume:   21.90 ml  12.12 ml/m LA Vol (A4C): 50.0 ml 27.68 ml/m  AORTIC VALVE                    PULMONIC VALVE AV Area (Vmax):    1.32 cm     PV Vmax:       1.33 m/s AV Area (Vmean):   1.32 cm     PV Peak grad:  7.1 mmHg AV Area (VTI):     1.37 cm AV Vmax:           201.00 cm/s AV Vmean:          126.000 cm/s AV VTI:            0.496 m AV Peak Grad:      16.2 mmHg AV Mean Grad:      8.0 mmHg LVOT Vmax:         93.30 cm/s LVOT Vmean:        58.700 cm/s LVOT VTI:          0.239 m LVOT/AV VTI ratio: 0.48  AORTA Ao Root diam: 2.70 cm Ao Asc diam:  3.10 cm MITRAL VALVE                TRICUSPID VALVE MV Area (PHT): 3.99 cm     TV Peak grad:   30.0 mmHg MV Peak grad:  15.1 mmHg    TV Vmax:        2.74 m/s MV Mean grad:  7.0 mmHg MV Vmax:       1.94 m/s     SHUNTS MV Vmean:      127.0 cm/s   Systemic VTI:  0.24 m MV Decel Time: 190 msec     Systemic Diam: 1.90 cm MV E velocity: 150.00 cm/s MV A velocity: 140.00 cm/s MV E/A ratio:  1.07 Dwayne D Callwood MD Electronically signed by Yolonda Kida MD Signature Date/Time: 04/18/2020/11:49:38 AM    Final      Medications:    . allopurinol  100 mg Oral Daily  . amLODipine  5 mg Oral  Daily  . furosemide  60 mg Intravenous Q12H  . [START ON 04/19/2020] gabapentin  300 mg Oral QHS  . heparin  5,000 Units Subcutaneous Q8H  . insulin glargine  48 Units Subcutaneous Daily  . iron polysaccharides  150 mg Oral Daily  . loratadine  10 mg Oral Daily  . metoprolol tartrate  25 mg Oral BID  . pantoprazole  40 mg Oral Daily  . rOPINIRole  0.5 mg Oral QHS  . sertraline  50 mg Oral Daily  . sucralfate  1 g Oral BID  . [START ON 04/23/2020] Vitamin D (Ergocalciferol)  50,000  Units Oral Weekly   acetaminophen **OR** acetaminophen, albuterol, dicyclomine, LORazepam, nitroGLYCERIN, ondansetron **OR** ondansetron (ZOFRAN) IV, traMADol, traZODone, triamcinolone  Assessment/ Plan:  86 y.o. female with past medical history of myelodysplastic syndrome, chronic kidney disease stage V baseline EGFR 14, diabetes mellitus type 2, hypertension, gout, coronary artery disease status post PTCA and CABG, peripheral neuropathy, GERD, hyperlipidemia, diabetic retinopathy, restless leg syndrome, anemia of chronic kidney disease, secondary hyperparathyroidism who presents with weakness, fatigue, and loose stools.  1.  Chronic kidney disease stage V.  Patient exhibiting some uremic symptoms.  We had a long discussion today.  EGFR quite low at 10 at the moment.  She is willing to consider renal placement therapy.  As such we will consult vascular surgery for PermCath placement tomorrow with initiation of hemodialysis.  Risk, benefits, and alternatives discussed.  2.  Anemia of CKD: Hemoglobin currently 8.8.  We will likely start the patient on Epogen with dialysis treatments.  3.  Metabolic acidosis.  Serum bicarbonate low at 17.  Should correct with dialysis treatments.  4.  Secondary hyperparathyroidism.  We will more fully evaluate her bone mineral metabolism parameters over the course of the hospitalization.    LOS: 1 Ardyth Kelso 10/3/20212:25 PM

## 2020-04-18 NOTE — Consult Note (Signed)
Consultation  Referring Provider:   Eugenie Norrie   Admit date: 10/2 Consult date: 10/2         Reason for Consultation:     Blood on toilet paper, loose stools         HPI:   Katherine Hernandez is a 86 y.o. 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 Appears to be approaching need to initiate dialysis. I was consulted for report of small volume hematochezia after wiping and loose stools. Patient states that as far back as she can remember she has had a funny stomach and has episodes of waxing and waning diarrhea. She wiped her self a couple of times and noticed bright red blood on the toilet paper with the last time being over a week ago. She did not see any in her stool. She had an EGD/Colonoscopy in 2018 that was overall unrevealing besides ileocecal ulceration with path showing ischemia and right sided colitis but no left sided so this good be due to hypotension. She did have biopsy show SSA but no right sided polyp noted in report. A left sided polyp was a tubular adenoma. She has a chronic history of anemia with a baseline of around 10 which is likely multifactorial from CKD and MDS. Follows with Heme/Onc as an outpatient and gets periodic iron infusions.  Past Medical History:  Diagnosis Date  . Arthritis   . CAD (coronary artery disease)    Last nuclear 02/2009 EF 72% and neg for ischemia   . CHF (congestive heart failure) ([place])   . CKD (chronic kidney disease) 03/17/2020  . Coronary artery disease    CAD with PTCA(cfx)fx -1993,and CABG ,2004  . Diabetes mellitus without complication ([place])   . Diabetic nephropathy ([place])   . Diabetic peripheral neuropathy ([place])   . GERD (gastroesophageal reflux disease)   . Hyperlipidemia   . Hypertension   . Obesity   . Retinopathy, diabetic, background ([place])   . RLS (restless legs syndrome)   . Thrombocytopenia ([place])   . Urinary incontinence      Past Surgical History:  Procedure Laterality Date  . ABDOMINAL HYSTERECTOMY    . BONE MARROW BIOPSY    . BREAST BIOPSY Left 1970   surgical exc  . BREAST BIOPSY Right 09/13/2016   path pending x 2 areas  . CHOLECYSTECTOMY  1968  . COLONOSCOPY WITH PROPOFOL N/A 01/02/2017   Procedure: COLONOSCOPY WITH PROPOFOL;  Surgeon: Lollie Sails, MD;  Location[facility] ENDOSCOPY;  Service: Endoscopy;  Laterality: N/A;  . CORONARY ANGIOPLASTY    . CORONARY ARTERY BYPASS GRAFT  02/2003  . ery    . ESOPHAGOGASTRODUODENOSCOPY (EGD) WITH PROPOFOL N/A 01/02/2017   Procedure: ESOPHAGOGASTRODUODENOSCOPY (EGD) WITH PROPOFOL;  Surgeon: Lollie Sails, MD;  Location: [facility] ENDOSCOPY;  Service: Endoscopy;  Laterality: N/A;    Family History  Problem Relation Age of Onset  . Breast cancer Sister 16  . Breast cancer Sister   . Diabetes Mother   . Heart disease Mother   . Kidney disease Father   . Hypertension Father   . Fanconi anemia Sister     Social History   Tobacco Use  . Smoking status: Former Smoker    Packs/day: 1.00    Years: 15.00    Pack years: 15.00    Types: Cigarettes    Quit date: 07/17/1978    Years since quitting: 41.7  .  Smokeless tobacco: Never Used  . Tobacco comment: quit in year 1980  Vaping Use  . Vaping Use: Never used  Substance Use Topics  . Alcohol use: No  . Drug use: No    Prior to Admission medications   Medication Sig Start Date End Date Taking? Authorizing Provider  acetaminophen (TYLENOL 8 HOUR ARTHRITIS PAIN) 650 MG CR tablet Take 1,300 mg by mouth in the morning.   Yes [provider]  albuterol (PROVENTIL HFA;VENTOLIN HFA) 108 (90 Base) MCG/ACT inhaler Inhale 2 puffs every 6 (six) hours as needed into the lungs for wheezing or shortness of breath.   Yes [provider]  allopurinol (ZYLOPRIM) 100 MG tablet Take 200 mg by mouth daily.  08/06/17  Yes [provider]  amLODipine (NORVASC) 10 MG tablet Take 10 mg by mouth  daily.  02/26/20  Yes [provider]  cetirizine (ZYRTEC) 10 MG tablet Take 10 mg by mouth daily.   Yes [provider]  conjugated estrogens (PREMARIN) vaginal cream INSERT 0.5 GRAMS VAGINALLY  TWICE WEEKLY 10/15/19  Yes [provider]  Cyanocobalamin (B-12 COMPLIANCE INJECTION IJ) Inject as directed every 30 (thirty) days.   Yes [provider]  dicyclomine (BENTYL) 10 MG capsule Take 10 mg by mouth 4 (four) times daily as needed. 03/17/20  Yes [provider]  ergocalciferol (VITAMIN D2) 50000 units capsule Take 50,000 Units by mouth once a week.   Yes [provider]  fenofibrate (TRICOR) 48 MG tablet Take 48 mg by mouth daily.  10/13/19 10/12/20 Yes [provider]  furosemide (LASIX) 80 MG tablet Take 80 mg by mouth daily.   Yes [provider]  gabapentin (NEURONTIN) 300 MG capsule Take 300 mg by mouth 2 (two) times daily. Pt takes 1 capsule in the morning and 2 capsules at bedtime.   Yes [provider]  Insulin Glargine (LANTUS SOLOSTAR) 100 UNIT/ML Solostar Pen Inject 48 Units into the skin daily.    Yes [provider]  iron polysaccharides (NIFEREX) 150 MG capsule Take 150 mg by mouth daily.  10/13/19  Yes [provider]  liraglutide (VICTOZA) 18 MG/3ML SOPN Inject 1.8 mg into the skin daily.    Yes [provider]  LORazepam (ATIVAN) 0.5 MG tablet Take 0.5 mg by mouth daily as needed for anxiety.    Yes [provider]  losartan (COZAAR) 100 MG tablet Take 100 mg by mouth daily.   Yes [provider]  metoprolol tartrate (LOPRESSOR) 25 MG tablet Take 25 mg by mouth 2 (two) times daily.   Yes [provider]  nitroGLYCERIN (NITROSTAT) 0.4 MG SL tablet Place 0.4 mg under the tongue every 5 (five) minutes as needed for chest pain.    Yes [provider]  pantoprazole (PROTONIX) 40 MG tablet Take 40 mg by mouth daily.    Yes [provider]   rOPINIRole (REQUIP) 0.5 MG tablet Take 0.5 mg by mouth at bedtime.   Yes [provider]  sertraline (ZOLOFT) 50 MG tablet Take 50 mg by mouth daily.  10/23/17 04/17/20 Yes [provider]  sucralfate (CARAFATE) 1 g tablet Take 1 g by mouth 2 (two) times daily. 11/24/19  Yes [provider]  traMADol (ULTRAM) 50 MG tablet Take 50 mg by mouth every 12 (twelve) hours as needed.  01/09/18  Yes [provider]  triamcinolone (NASACORT) 55 MCG/ACT AERO nasal inhaler 2 sprays daily. 11/24/19  Yes [provider]    Current  Facility-Administered Medications  Medication Dose Route Frequency Provider Last Rate Last Admin  . acetaminophen (TYLENOL) tablet 650 mg  650 mg Oral Q6H PRN Mansy, Jan A, MD       Or  . acetaminophen (TYLENOL) suppository 650 mg  650 mg Rectal Q6H PRN Mansy, Jan A, MD      . albuterol (PROVENTIL) (2.5 MG/3ML) 0.083% nebulizer solution 3 mL  3 mL Inhalation Q6H PRN Mansy, Jan A, MD      . allopurinol (ZYLOPRIM) tablet 100 mg  100 mg Oral Daily Max Sane, MD   100 mg at 04/18/20 0944  . amLODipine (NORVASC) tablet 5 mg  5 mg Oral Daily Mansy, Jan A, MD   5 mg at 04/18/20 0944  . dicyclomine (BENTYL) capsule 10 mg  10 mg Oral QID PRN Mansy, Jan A, MD      . furosemide (LASIX) injection 60 mg  60 mg Intravenous Q12H Mansy, Jan A, MD   60 mg at 04/18/20 0947  . [START ON 04/19/2020] gabapentin (NEURONTIN) capsule 300 mg  300 mg Oral QHS Max Sane, MD      . heparin injection 5,000 Units  5,000 Units Subcutaneous Q8H Mansy, Arvella Merles, MD   5,000 Units at 04/18/20 0945  . insulin glargine (LANTUS) injection 48 Units  48 Units Subcutaneous Daily Mansy, Jan A, MD   48 Units at 04/18/20 0945  . iron polysaccharides (NIFEREX) capsule 150 mg  150 mg Oral Daily Mansy, Jan A, MD   150 mg at 04/18/20 0944  . loratadine (CLARITIN) tablet 10 mg  10 mg Oral Daily Mansy, Jan A, MD   10 mg at 04/18/20 0944  . LORazepam (ATIVAN) tablet 0.5 mg  0.5 mg Oral Q6H  PRN Mansy, Jan A, MD      . metoprolol tartrate (LOPRESSOR) tablet 25 mg  25 mg Oral BID Mansy, Jan A, MD   25 mg at 04/18/20 0944  . nitroGLYCERIN (NITROSTAT) SL tablet 0.4 mg  0.4 mg Sublingual Q5 min PRN Mansy, Jan A, MD      . ondansetron [facility]) tablet 4 mg  4 mg Oral Q6H PRN Mansy, Jan A, MD       Or  . ondansetron [facility]) injection 4 mg  4 mg Intravenous Q6H PRN Mansy, Jan A, MD      . pantoprazole (PROTONIX) EC tablet 40 mg  40 mg Oral Daily Mansy, Jan A, MD   40 mg at 04/18/20 0944  . rOPINIRole (REQUIP) tablet 0.5 mg  0.5 mg Oral QHS Mansy, Jan A, MD   0.5 mg at 04/18/20 0044  . sertraline (ZOLOFT) tablet 50 mg  50 mg Oral Daily Mansy, Jan A, MD   50 mg at 04/18/20 0945  . sucralfate (CARAFATE) tablet 1 g  1 g Oral BID Mansy, Jan A, MD   1 g at 04/18/20 0944  . traMADol (ULTRAM) tablet 50 mg  50 mg Oral Q12H PRN Mansy, Jan A, MD      . traZODone (DESYREL) tablet 25 mg  25 mg Oral QHS PRN Mansy, Jan A, MD      . triamcinolone (NASACORT) nasal inhaler 2 spray  2 spray Each Nare Daily Mansy, Arvella Merles, MD      . Derrill Memo ON 04/23/2020] Vitamin D (Ergocalciferol) (DRISDOL) capsule 50,000 Units  50,000 Units Oral Weekly Mansy, Arvella Merles, MD       Current Outpatient Medications  Medication Sig Dispense Refill  . acetaminophen (TYLENOL 8 HOUR ARTHRITIS PAIN)  650 MG CR tablet Take 1,300 mg by mouth in the morning.    Marland Kitchen albuterol (PROVENTIL HFA;VENTOLIN HFA) 108 (90 Base) MCG/ACT inhaler Inhale 2 puffs every 6 (six) hours as needed into the lungs for wheezing or shortness of breath.    . allopurinol (ZYLOPRIM) 100 MG tablet Take 200 mg by mouth daily.     Marland Kitchen amLODipine (NORVASC) 10 MG tablet Take 10 mg by mouth daily.     . cetirizine (ZYRTEC) 10 MG tablet Take 10 mg by mouth daily.    Marland Kitchen conjugated estrogens (PREMARIN) vaginal cream INSERT 0.5 GRAMS VAGINALLY  TWICE WEEKLY    . Cyanocobalamin (B-12 COMPLIANCE INJECTION IJ) Inject as directed every 30 (thirty) days.    Marland Kitchen dicyclomine (BENTYL) 10 MG  capsule Take 10 mg by mouth 4 (four) times daily as needed.    . ergocalciferol (VITAMIN D2) 50000 units capsule Take 50,000 Units by mouth once a week.    . fenofibrate (TRICOR) 48 MG tablet Take 48 mg by mouth daily.     . furosemide (LASIX) 80 MG tablet Take 80 mg by mouth daily.    Marland Kitchen gabapentin (NEURONTIN) 300 MG capsule Take 300 mg by mouth 2 (two) times daily. Pt takes 1 capsule in the morning and 2 capsules at bedtime.    . Insulin Glargine (LANTUS SOLOSTAR) 100 UNIT/ML Solostar Pen Inject 48 Units into the skin daily.     . iron polysaccharides (NIFEREX) 150 MG capsule Take 150 mg by mouth daily.     Marland Kitchen liraglutide (VICTOZA) 18 MG/3ML SOPN Inject 1.8 mg into the skin daily.     Marland Kitchen LORazepam (ATIVAN) 0.5 MG tablet Take 0.5 mg by mouth daily as needed for anxiety.     Marland Kitchen losartan (COZAAR) 100 MG tablet Take 100 mg by mouth daily.    . metoprolol tartrate (LOPRESSOR) 25 MG tablet Take 25 mg by mouth 2 (two) times daily.    . nitroGLYCERIN (NITROSTAT) 0.4 MG SL tablet Place 0.4 mg under the tongue every 5 (five) minutes as needed for chest pain.     . pantoprazole (PROTONIX) 40 MG tablet Take 40 mg by mouth daily.     Marland Kitchen rOPINIRole (REQUIP) 0.5 MG tablet Take 0.5 mg by mouth at bedtime.    . sertraline (ZOLOFT) 50 MG tablet Take 50 mg by mouth daily.     . sucralfate (CARAFATE) 1 g tablet Take 1 g by mouth 2 (two) times daily.    . traMADol (ULTRAM) 50 MG tablet Take 50 mg by mouth every 12 (twelve) hours as needed.     . triamcinolone (NASACORT) 55 MCG/ACT AERO nasal inhaler 2 sprays daily.      Allergies as of 04/17/2020 - Review Complete 04/17/2020  Allergen Reaction Noted  . Amoxicillin Diarrhea 01/06/2014  . Cefdinir Diarrhea 01/06/2014  . Doxycycline Diarrhea 01/06/2014  . Eliquis [apixaban] Other (See Comments) 07/03/2017  . Erythromycin Diarrhea 01/06/2014  . Lisinopril Cough 01/06/2014  . Macrodantin [nitrofurantoin] Diarrhea 01/06/2014  . Mobic [meloxicam]  01/06/2014  .  Statins Other (See Comments) 01/06/2014  . Sulfa antibiotics Hives 01/06/2014     Review of Systems:    All systems reviewed and negative except where noted in HPI.  Review of Systems  Constitutional: Positive for chills. Negative for fever.  Eyes: Negative for blurred vision.  Respiratory: Positive for shortness of breath.   Cardiovascular: Negative for chest pain.  Gastrointestinal: Positive for diarrhea. Negative for abdominal pain, blood in stool, constipation and melena.  Genitourinary: Negative for frequency.  Musculoskeletal: Positive for joint pain.  Skin: Negative for rash.  Neurological: Negative for focal weakness.  Psychiatric/Behavioral: Negative for substance abuse.  All other systems reviewed and are negative.     Physical Exam:  Vital signs in last 24 hours: Temp:  [98.1 F (36.7 C)] 98.1 F (36.7 C) (10/02 2157) Pulse Rate:  [62-76] 67 (10/03 1100) Resp:  [14-24] 18 (10/03 1100) BP: (81-171)/(48-99) 159/57 (10/03 1100) SpO2:  [98 %-100 %] 100 % (10/03 1100) Weight:  [79.4 kg] 79.4 kg (10/02 2158)   General:   Pleasant in NAD Head:  Normocephalic and atraumatic. ENT: PERRL Neck:  Supple; no masses felt Lungs:  No respiratory distress Abdomen:   Fsoft, nondistended, nontender. Msk:  Trace edema Neurologic:  Alert and  oriented x4;  Cranial nerves II-XII intact.  Skin:  Warm, dry, pink without significant lesions or rashes. Psych:  Alert and cooperative. Normal affect.  LAB RESULTS: Recent Labs    04/17/20 2204  WBC 7.6  HGB 8.8*  HCT 25.6*  PLT 99*   BMET Recent Labs    04/17/20 2204 04/18/20 0407  NA 142 142  K 3.4* 3.5  CL 115* 117*  CO2 20* 17*  GLUCOSE 142* 98  BUN 90* 90*  CREATININE 3.81* 3.70*  CALCIUM 9.5 8.8*   LFT Recent Labs    04/17/20 2204  PROT 7.0  ALBUMIN 3.9  AST 34  ALT 26  ALKPHOS 67  BILITOT 0.8   PT/INR No results for input(s): LABPROT, INR in the last 72 hours.  STUDIES: DG Chest 1 View  Result  Date: 04/17/2020 CLINICAL DATA:  Shortness of breath and edema EXAM: CHEST  1 VIEW COMPARISON:  May 25, 2017 FINDINGS: The heart size and mediastinal contours are unchanged with mild cardiomegaly. Aortic knob calcifications are seen. Overlying median sternotomy wires are present. There is prominence of the central pulmonary vasculature. No large airspace consolidation or pleural effusion. No acute osseous abnormality. IMPRESSION: Mild cardiomegaly and pulmonary vascular congestion Electronically Signed   By: Prudencio Pair M.D.   On: 04/17/2020 22:27       Impression / Plan:   86 y/o lady with history of CKD 5, MDS, and other chronic medical problems who is here for shortness and breath and swelling with worsening kidney function, possibly approaching need for dialysis. Consulted for small volume hematochezia (only on toilet paper) and loose stools. Hematochezia likely due to benign anal outlet pathology such as hemorrhoids. History of patient sounds likes she has IBS-D versus medication induced loose stools. Anemia likely secondary to chronic medical issues such as renal disease.  - agree with stool studies to rule out infections - if infection negative, can use stool bulkers such as fiber and immodium PRN - patient will need outpatient follow-up with GI to discuss possible repeat colonoscopy - will follow peripherally  Please call with any questions or concerns  C. Raylene Miyamoto MD, MPH [place]

## 2020-04-18 NOTE — ED Notes (Signed)
Pt is laying in bed. Pt states she got up and went to the bathroom with another RN. Hunter, RN stated he got pt up and she had a BM and also urinated. Pt has purewick back in place.  Pt laying on her right side.

## 2020-04-18 NOTE — Consult Note (Signed)
CARDIOLOGY CONSULT NOTE               Patient ID: Katherine Hernandez MRN: [id number] DOB/AGE: September 12, 1929 86 y.o.  Admit date: 04/17/2020 Referring Physician Dr. Garner Gavel hospitalist Primary Physician Dr. Ginette Pitman primary Primary Cardiologist unknown Reason for Consultation SOB  HPI: Patient presents with stage IV kidney disease diastolic congestive heart failure coronary type 2 diabetes hypertension dyslipidemia presented with worsening dyspnea shortness of breath weakness PND and orthopnea denies any chest pain no fever chills or sweats so came to the emergency room for evaluation was admitted for dyspnea  Review of systems complete and found to be negative unless listed above     Past Medical History:  Diagnosis Date  . Arthritis   . CAD (coronary artery disease)    Last nuclear 02/2009 EF 72% and neg for ischemia   . CHF (congestive heart failure) ([place])   . CKD (chronic kidney disease) 03/17/2020  . Coronary artery disease    CAD with PTCA(cfx)fx -1993,and CABG ,2004  . Diabetes mellitus without complication ([place])   . Diabetic nephropathy ([place])   . Diabetic peripheral neuropathy ([place])   . GERD (gastroesophageal reflux disease)   . Hyperlipidemia   . Hypertension   . Obesity   . Retinopathy, diabetic, background ([place])   . RLS (restless legs syndrome)   . Thrombocytopenia ([place])   . Urinary incontinence     Past Surgical History:  Procedure Laterality Date  . ABDOMINAL HYSTERECTOMY    . BONE MARROW BIOPSY    . BREAST BIOPSY Left 1970   surgical exc  . BREAST BIOPSY Right 09/13/2016   path pending x 2 areas  . CHOLECYSTECTOMY  1968  . COLONOSCOPY WITH PROPOFOL N/A 01/02/2017   Procedure: COLONOSCOPY WITH PROPOFOL;  Surgeon: Lollie Sails, MD;  Location: [facility] ENDOSCOPY;  Service: Endoscopy;  Laterality: N/A;  . CORONARY ANGIOPLASTY    . CORONARY ARTERY BYPASS GRAFT  02/2003  . ery    . ESOPHAGOGASTRODUODENOSCOPY (EGD) WITH PROPOFOL N/A 01/02/2017   Procedure:  ESOPHAGOGASTRODUODENOSCOPY (EGD) WITH PROPOFOL;  Surgeon: Lollie Sails, MD;  Location: [facility] ENDOSCOPY;  Service: Endoscopy;  Laterality: N/A;    (Not in a hospital admission)  Social History   Socioeconomic History  . Marital status: Widowed    Spouse name: Not on file  . Number of children: 3  . Years of education: 8  . Highest education level: 8th grade  Occupational History  . Occupation: retired  Tobacco Use  . Smoking status: Former Smoker    Packs/day: 1.00    Years: 15.00    Pack years: 15.00    Types: Cigarettes    Quit date: 07/17/1978    Years since quitting: 41.7  . Smokeless tobacco: Never Used  . Tobacco comment: quit in year 1980  Vaping Use  . Vaping Use: Never used  Substance and Sexual Activity  . Alcohol use: No  . Drug use: No  . Sexual activity: Never  Other Topics Concern  . Not on file  Social History Narrative   History of smoking cigarettes: Former smoker   Quit in year : 1980   Pack-year Hx: 40   No alcohol.   Caffeine :yes    Diet : no   Exercise :no   Occupation : unemployed, retired    No Education   Martial status: single,widowed   LV systolic Dysfunction   EF less than 40 >no   Social Determinants of Radio broadcast assistant  Strain:   . Difficulty of Paying Living Expenses: Not on file  Food Insecurity:   . Worried About Charity fundraiser in the Last Year: Not on file  . Ran Out of Food in the Last Year: Not on file  Transportation Needs:   . Lack of Transportation (Medical): Not on file  . Lack of Transportation (Non-Medical): Not on file  Physical Activity:   . Days of Exercise per Week: Not on file  . Minutes of Exercise per Session: Not on file  Stress:   . Feeling of Stress : Not on file  Social Connections:   . Frequency of Communication with Friends and Family: Not on file  . Frequency of Social Gatherings with Friends and Family: Not on file  . Attends Religious Services: Not on file  . Active Member of  Clubs or Organizations: Not on file  . Attends Archivist Meetings: Not on file  . Marital Status: Not on file  Intimate Partner Violence:   . Fear of Current or Ex-Partner: Not on file  . Emotionally Abused: Not on file  . Physically Abused: Not on file  . Sexually Abused: Not on file    Family History  Problem Relation Age of Onset  . Breast cancer Sister 71  . Breast cancer Sister   . Diabetes Mother   . Heart disease Mother   . Kidney disease Father   . Hypertension Father   . Fanconi anemia Sister       Review of systems complete and found to be negative unless listed above      PHYSICAL EXAM  General: Well developed, well nourished, in no acute distress HEENT:  Normocephalic and atramatic Neck:  No JVD.  Lungs: Clear bilaterally to auscultation and percussion. Heart: HRRR . Normal S1 and S2 without gallops or murmurs.  Abdomen: Bowel sounds are positive, abdomen soft and non-tender  Msk:  Back normal, normal gait. Normal strength and tone for age. Extremities: No clubbing, cyanosis or edema.   Neuro: Alert and oriented X 3. Psych:  Good affect, responds appropriately  Labs:   Lab Results  Component Value Date   WBC 7.6 04/17/2020   HGB 8.8 (L) 04/17/2020   HCT 25.6 (L) 04/17/2020   MCV 94.1 04/17/2020   PLT 99 (L) 04/17/2020    Recent Labs  Lab 04/17/20 2204 04/17/20 2204 04/18/20 0407  NA 142   < > 142  K 3.4*   < > 3.5  CL 115*   < > 117*  CO2 20*   < > 17*  BUN 90*   < > 90*  CREATININE 3.81*   < > 3.70*  CALCIUM 9.5   < > 8.8*  PROT 7.0  --   --   BILITOT 0.8  --   --   ALKPHOS 67  --   --   ALT 26  --   --   AST 34  --   --   GLUCOSE 142*   < > 98   < > = values in this interval not displayed.   Lab Results  Component Value Date   CKTOTAL 88 06/09/2012   CKMB 1.7 06/09/2012   TROPONINI 0.05 (HH) 05/26/2017   No results found for: CHOL No results found for: HDL No results found for: LDLCALC No results found for: TRIG No  results found for: CHOLHDL No results found for: LDLDIRECT    Radiology: DG Chest 1 View  Result Date: 04/17/2020  CLINICAL DATA:  Shortness of breath and edema EXAM: CHEST  1 VIEW COMPARISON:  May 25, 2017 FINDINGS: The heart size and mediastinal contours are unchanged with mild cardiomegaly. Aortic knob calcifications are seen. Overlying median sternotomy wires are present. There is prominence of the central pulmonary vasculature. No large airspace consolidation or pleural effusion. No acute osseous abnormality. IMPRESSION: Mild cardiomegaly and pulmonary vascular congestion Electronically Signed   By: Prudencio Pair M.D.   On: 04/17/2020 22:27    EKG: Normal sinus rhythm nonspecific ST-T wave changes  ASSESSMENT AND PLAN:  Shortness of breath Dyspnea Acute on chronic respiratory failure Congestive heart failure diastolic dysfunction Diabetes type 2 Chronic renal insufficiency stage IV Hypertension Dyslipidemia Hypokalemia Anemia possible lower GI bleeding . Plan Agree with admit to telemetry follow-up EKGs troponins Recommend supplemental oxygen respiratory support Agree with treatment for diastolic congestive heart failure Echocardiogram for assessment of left ventricular function wall motion Severe renal insufficiency recommend nephrology input consider hemodialysis Correct electrolytes Possible GI bleed anemia consult GI Agree with hypertension management and control consider beta-blocker Cardiac cath is not an option because of severe renal insufficiency   Signed: Yolonda Kida MD 04/18/2020, 9:28 AM

## 2020-04-18 NOTE — ED Notes (Signed)
Pt ambulatory to the restroom. Pt had small BM and urinated at this time

## 2020-04-18 NOTE — Progress Notes (Signed)
*  PRELIMINARY RESULTS* Echocardiogram 2D Echocardiogram has been performed.  Lavell Luster Angelita Harnack 04/18/2020, 8:07 AM

## 2020-04-18 NOTE — ED Notes (Signed)
Bedside commode placed in room. 

## 2020-04-19 ENCOUNTER — Encounter: Payer: Self-pay | Admitting: Family Medicine

## 2020-04-19 ENCOUNTER — Inpatient Hospital Stay
Admission: EM | Disposition: A | Discharge status: Skilled Nursing Facility | Payer: Self-pay | Source: Home / Self Care | Attending: Internal Medicine

## 2020-04-19 DIAGNOSIS — E876 Hypokalemia: Secondary | ICD-10-CM

## 2020-04-19 DIAGNOSIS — I5031 Acute diastolic (congestive) heart failure: Secondary | ICD-10-CM | POA: Diagnosis not present

## 2020-04-19 DIAGNOSIS — I10 Essential (primary) hypertension: Secondary | ICD-10-CM | POA: Diagnosis not present

## 2020-04-19 DIAGNOSIS — I509 Heart failure, unspecified: Secondary | ICD-10-CM

## 2020-04-19 DIAGNOSIS — N189 Chronic kidney disease, unspecified: Secondary | ICD-10-CM

## 2020-04-19 DIAGNOSIS — N185 Chronic kidney disease, stage 5: Secondary | ICD-10-CM

## 2020-04-19 HISTORY — PX: DIALYSIS/PERMA CATHETER INSERTION: CATH118288

## 2020-04-19 LAB — BASIC METABOLIC PANEL
Anion gap: 7 (ref 5–15)
BUN: 81 mg/dL — ABNORMAL HIGH (ref 8–23)
CO2: 24 mmol/L (ref 22–32)
Calcium: 9.2 mg/dL (ref 8.9–10.3)
Chloride: 112 mmol/L — ABNORMAL HIGH (ref 98–111)
Creatinine, Ser: 3.22 mg/dL — ABNORMAL HIGH (ref 0.44–1.00)
GFR calc Af Amer: 14 mL/min — ABNORMAL LOW (ref >60)
GFR calc non Af Amer: 12 mL/min — ABNORMAL LOW (ref >60)
Glucose, Bld: 104 mg/dL — ABNORMAL HIGH (ref 70–99)
Potassium: 3.4 mmol/L — ABNORMAL LOW (ref 3.5–5.1)
Sodium: 143 mmol/L (ref 135–145)

## 2020-04-19 LAB — HEPATITIS B SURFACE ANTIBODY,QUALITATIVE: Hep B S Ab: NONREACTIVE

## 2020-04-19 LAB — CBC
HCT: 24.4 % — ABNORMAL LOW (ref 36.0–46.0)
Hemoglobin: 8.6 g/dL — ABNORMAL LOW (ref 12.0–15.0)
MCH: 33.1 pg (ref 26.0–34.0)
MCHC: 35.2 g/dL (ref 30.0–36.0)
MCV: 93.8 fL (ref 80.0–100.0)
Platelets: 70 10*3/uL — ABNORMAL LOW (ref 150–400)
RBC: 2.6 MIL/uL — ABNORMAL LOW (ref 3.87–5.11)
RDW: 16.4 % — ABNORMAL HIGH (ref 11.5–15.5)
WBC: 5.4 10*3/uL (ref 4.0–10.5)
nRBC: 0 % (ref 0.0–0.2)

## 2020-04-19 LAB — PHOSPHORUS: Phosphorus: 2.4 mg/dL — ABNORMAL LOW (ref 2.5–4.6)

## 2020-04-19 LAB — HEPATITIS B CORE ANTIBODY, IGM: Hep B C IgM: NONREACTIVE

## 2020-04-19 LAB — HEPATITIS B SURFACE ANTIGEN: Hepatitis B Surface Ag: NONREACTIVE

## 2020-04-19 LAB — GLUCOSE, CAPILLARY
Glucose-Capillary: 94 mg/dL (ref 70–99)
Glucose-Capillary: 102 mg/dL — ABNORMAL HIGH (ref 70–99)

## 2020-04-19 SURGERY — DIALYSIS/PERMA CATHETER INSERTION
Anesthesia: Choice

## 2020-04-19 MED ORDER — AMLODIPINE BESYLATE 10 MG PO TABS
10.0000 mg | ORAL_TABLET | Freq: Every day | ORAL | Status: DC
  Administered 2020-04-21 – 2020-04-22 (×2): 10 mg via ORAL
  Filled 2020-04-19 (×2): qty 1

## 2020-04-19 MED ORDER — SODIUM CHLORIDE 0.9 % IV SOLN: 100.0000 mL | INTRAVENOUS | Status: DC | PRN

## 2020-04-19 MED ORDER — CLINDAMYCIN PHOSPHATE 300 MG/50ML IV SOLN
INTRAVENOUS | Status: AC
  Administered 2020-04-19: 300 mg via INTRAVENOUS
  Filled 2020-04-19: qty 50

## 2020-04-19 MED ORDER — LIDOCAINE HCL (PF) 1 % IJ SOLN
5.0000 mL | INTRAMUSCULAR | Status: DC | PRN
  Filled 2020-04-19: qty 5

## 2020-04-19 MED ORDER — PENTAFLUOROPROP-TETRAFLUOROETH EX AERO
1.0000 "application " | INHALATION_SPRAY | CUTANEOUS | Status: DC | PRN
  Filled 2020-04-19: qty 30

## 2020-04-19 MED ORDER — CHLORHEXIDINE GLUCONATE CLOTH 2 % EX PADS
6.0000 | MEDICATED_PAD | Freq: Every day | CUTANEOUS | Status: DC
  Administered 2020-04-19 – 2020-04-22 (×3): 6 via TOPICAL

## 2020-04-19 MED ORDER — HEPARIN SODIUM (PORCINE) 1000 UNIT/ML DIALYSIS
1000.0000 [IU] | INTRAMUSCULAR | Status: DC | PRN
  Filled 2020-04-19: qty 1

## 2020-04-19 MED ORDER — MIDAZOLAM HCL 2 MG/2ML IJ SOLN
INTRAMUSCULAR | Status: AC
  Filled 2020-04-19: qty 2

## 2020-04-19 MED ORDER — ORAL CARE MOUTH RINSE
15.0000 mL | Freq: Two times a day (BID) | OROMUCOSAL | Status: DC
  Administered 2020-04-19 – 2020-04-20 (×3): 15 mL via OROMUCOSAL

## 2020-04-19 MED ORDER — FENTANYL CITRATE (PF) 100 MCG/2ML IJ SOLN
INTRAMUSCULAR | Status: AC
Start: 2020-04-19 — End: 2020-04-20
  Filled 2020-04-19: qty 2

## 2020-04-19 MED ORDER — MIDAZOLAM HCL 2 MG/2ML IJ SOLN
INTRAMUSCULAR | Status: DC | PRN
  Administered 2020-04-19: 1 mg via INTRAVENOUS

## 2020-04-19 MED ORDER — FENTANYL CITRATE (PF) 100 MCG/2ML IJ SOLN
INTRAMUSCULAR | Status: DC | PRN
Start: 2020-04-19 — End: 2020-04-19
  Administered 2020-04-19: 25 ug via INTRAVENOUS

## 2020-04-19 MED ORDER — LIDOCAINE-PRILOCAINE 2.5-2.5 % EX CREA
1.0000 "application " | TOPICAL_CREAM | CUTANEOUS | Status: DC | PRN
  Filled 2020-04-19: qty 5

## 2020-04-19 MED ORDER — CLINDAMYCIN PHOSPHATE 300 MG/50ML IV SOLN
300.0000 mg | Freq: Once | INTRAVENOUS | Status: AC
  Filled 2020-04-19: qty 50

## 2020-04-19 MED ORDER — ALTEPLASE 2 MG IJ SOLR: 2.0000 mg | Freq: Once | INTRAMUSCULAR | Status: DC | PRN

## 2020-04-19 SURGICAL SUPPLY — 13 items
ADH SKN CLS APL DERMABOND .7 (GAUZE/BANDAGES/DRESSINGS) ×1
BIOPATCH RED 1 DISK 7.0 (GAUZE/BANDAGES/DRESSINGS) ×1 IMPLANT
BIOPATCH RED 1IN DISK 7.0MM (GAUZE/BANDAGES/DRESSINGS) ×1
CATH PALINDROME-P 19CM W/VT (CATHETERS) ×2 IMPLANT
DERMABOND ADVANCED (GAUZE/BANDAGES/DRESSINGS) ×2
DERMABOND ADVANCED .7 DNX12 (GAUZE/BANDAGES/DRESSINGS) IMPLANT
PACK ANGIOGRAPHY (CUSTOM PROCEDURE TRAY) ×2 IMPLANT
SUT MNCRL 4-0 (SUTURE) ×3
SUT MNCRL 4-0 27XMFL (SUTURE) ×1
SUT VIC AB 3-0 CT1 27 (SUTURE) ×3
SUT VIC AB 3-0 CT1 TAPERPNT 27 (SUTURE) IMPLANT
SUTURE MNCRL 4-0 27XMF (SUTURE) IMPLANT
TOWEL OR 17X26 4PK STRL BLUE (TOWEL DISPOSABLE) ×2 IMPLANT

## 2020-04-19 NOTE — Progress Notes (Signed)
Heart Failure Nurse Navigator Note  HFpEF- 50-55%  Patient presented to the ED with complaints of worsening SOB, lower extremity edema.  Initial meeting with patient, daughter Katherine Hernandez is also present.  Comorbidities:  Coronary artery disease/CABG/Stenting Diabetes Hyperlipidemia Hypertension Obesity CKD stage V Atrial Fibrillation Anemia Gout  Medications:  Norvasc 5 mg daily Lasix 60 mg IV BID Metoprolol tartrate 25 mg BID  Labs:  Sodium 144, potassium 3.4, BUN 81, creatinine 3.22.  Weight- 77.5 kg(same for last two days)  Intake 120 ml Output 2650 ml  Assessment:   General- she is awake and alert offers no complaints.  HEENT-normocephalic, non icterics, no JVD  Cardiac- Heart tones regular, no murmur or rubs.  Chest-breath sounds clear to posterior auscultation.  Abdomen- rounded, soft.  Musculoskeletal- no lower extremity edema.  Neuro- speech is clear, moves all extremities.  Psych is pleasant and approproiate.   Meet with patient and her daughter, Katherine Hernandez.  She lives in [place], where she has resided for the last 9 years.   She states she does not weigh herself daily, encouraged to get back in that routine and to record. After emptying bladder and the same amount of clothes or in the nude.  Discussed low sodium diet and fluid restriction.  Went over zones magnet, also talked about early staitey, she and daughter voice understanding.  Given HF zones magnet and HF teaching booklet.  They do not have any further questions at this time.   Pricilla Riffle RN, CHFN

## 2020-04-19 NOTE — Consult Note (Signed)
[facility] Vascular Consult Note  MRN : [id number]  Katherine Hernandez is a 86 y.o. (1930/01/23) female who presents with chief complaint of  Chief Complaint  Patient presents with  . Shortness of Breath   History of Present Illness:  Katherine Hernandez is a 3 year oldCaucasian femalewith a known history of stage V chronic kidney disease, diastolic CHF, coronary artery disease, type 2 diabetes mellitus, hypertension dyslipidemia, presented to the emergency room with acute onset of worsening dyspnea that started todayas well as orthopnea and paroxysmal nocturnal dyspnea.   She has been having worsening lower extremity edema as well as dry cough and wheezing. She denies any fever or chills. She denies any nausea or vomiting or abdominal pain. She admitted to diarrhea which has been intermittent over the last several days, occasionally watery and loose. She would notice minimal amount of blood when she wipes. No bright red bleeding per rectum or melanotic stools. No headache or dizziness or blurred vision.  Patient with known chronic kidney disease now progressing to stage V requiring hemodialysis.  Patient does not have an adequate dialysis access to allow for hemodialysis.  Vascular surgery was consulted by Dr. Holley Raring for insertion of a PermCath.  Current Facility-Administered Medications  Medication Dose Route Frequency Provider Last Rate Last Admin  . acetaminophen (TYLENOL) tablet 650 mg  650 mg Oral Q6H PRN Mansy, Jan A, MD   650 mg at 04/18/20 2155   Or  . acetaminophen (TYLENOL) suppository 650 mg  650 mg Rectal Q6H PRN Mansy, Jan A, MD      . albuterol (PROVENTIL) (2.5 MG/3ML) 0.083% nebulizer solution 3 mL  3 mL Inhalation Q6H PRN Mansy, Jan A, MD      . allopurinol (ZYLOPRIM) tablet 100 mg  100 mg Oral Daily Max Sane, MD   100 mg at 04/18/20 0944  . amLODipine (NORVASC) tablet 5 mg  5 mg Oral Daily Mansy, Jan A, MD   5 mg at 04/18/20 0944  . Chlorhexidine  Gluconate Cloth 2 % PADS 6 each  6 each Topical Q0600 Lateef, Mun[place], MD      . dicyclomine (BENTYL) capsule 10 mg  10 mg Oral QID PRN Mansy, Jan A, MD      . furosemide (LASIX) injection 60 mg  60 mg Intravenous Q12H Mansy, Jan A, MD   60 mg at 04/19/20 8242  . gabapentin (NEURONTIN) capsule 300 mg  300 mg Oral QHS Max Sane, MD      . heparin injection 5,000 Units  5,000 Units Subcutaneous Q8H Mansy, Arvella Merles, MD   5,000 Units at 04/19/20 0028  . insulin glargine (LANTUS) injection 48 Units  48 Units Subcutaneous Daily Mansy, Jan A, MD   48 Units at 04/18/20 0945  . iron polysaccharides (NIFEREX) capsule 150 mg  150 mg Oral Daily Mansy, Jan A, MD   150 mg at 04/18/20 0944  . loratadine (CLARITIN) tablet 10 mg  10 mg Oral Daily Mansy, Jan A, MD   10 mg at 04/18/20 0944  . LORazepam (ATIVAN) tablet 0.5 mg  0.5 mg Oral Q6H PRN Mansy, Jan A, MD      . MEDLINE mouth rinse  15 mL Mouth Rinse BID Max Sane, MD   15 mL at 04/19/20 0027  . metoprolol tartrate (LOPRESSOR) tablet 25 mg  25 mg Oral BID Mansy, Jan A, MD   25 mg at 04/18/20 2145  . nitroGLYCERIN (NITROSTAT) SL tablet 0.4 mg  0.4 mg Sublingual  Q5 min PRN Mansy, Jan A, MD      . ondansetron [facility]) tablet 4 mg  4 mg Oral Q6H PRN Mansy, Jan A, MD       Or  . ondansetron [facility]) injection 4 mg  4 mg Intravenous Q6H PRN Mansy, Jan A, MD      . pantoprazole (PROTONIX) EC tablet 40 mg  40 mg Oral Daily Mansy, Jan A, MD   40 mg at 04/18/20 0944  . rOPINIRole (REQUIP) tablet 0.5 mg  0.5 mg Oral QHS Mansy, Jan A, MD   0.5 mg at 04/18/20 2144  . sertraline (ZOLOFT) tablet 50 mg  50 mg Oral Daily Mansy, Jan A, MD   50 mg at 04/18/20 0945  . sucralfate (CARAFATE) tablet 1 g  1 g Oral BID Mansy, Jan A, MD   1 g at 04/18/20 2145  . traMADol (ULTRAM) tablet 50 mg  50 mg Oral Q12H PRN Mansy, Jan A, MD      . traZODone (DESYREL) tablet 25 mg  25 mg Oral QHS PRN Mansy, Jan A, MD   25 mg at 04/18/20 2155  . triamcinolone (NASACORT) nasal inhaler 2 spray  2  spray Each Nare Daily PRN Max Sane, MD      . Derrill Memo ON 04/23/2020] Vitamin D (Ergocalciferol) (DRISDOL) capsule 50,000 Units  50,000 Units Oral Weekly Mansy, Arvella Merles, MD       Past Medical History:  Diagnosis Date  . Arthritis   . CAD (coronary artery disease)    Last nuclear 02/2009 EF 72% and neg for ischemia   . CHF (congestive heart failure) ([place])   . CKD (chronic kidney disease) 03/17/2020  . Coronary artery disease    CAD with PTCA(cfx)fx -1993,and CABG ,2004  . Diabetes mellitus without complication ([place])   . Diabetic nephropathy ([place])   . Diabetic peripheral neuropathy ([place])   . GERD (gastroesophageal reflux disease)   . Hyperlipidemia   . Hypertension   . Obesity   . Retinopathy, diabetic, background ([place])   . RLS (restless legs syndrome)   . Thrombocytopenia ([place])   . Urinary incontinence     Past Surgical History:  Procedure Laterality Date  . ABDOMINAL HYSTERECTOMY    . BONE MARROW BIOPSY    . BREAST BIOPSY Left 1970   surgical exc  . BREAST BIOPSY Right 09/13/2016   path pending x 2 areas  . CHOLECYSTECTOMY  1968  . COLONOSCOPY WITH PROPOFOL N/A 01/02/2017   Procedure: COLONOSCOPY WITH PROPOFOL;  Surgeon: Lollie Sails, MD;  Location[facility] ENDOSCOPY;  Service: Endoscopy;  Laterality: N/A;  . CORONARY ANGIOPLASTY    . CORONARY ARTERY BYPASS GRAFT  02/2003  . ery    . ESOPHAGOGASTRODUODENOSCOPY (EGD) WITH PROPOFOL N/A 01/02/2017   Procedure: ESOPHAGOGASTRODUODENOSCOPY (EGD) WITH PROPOFOL;  Surgeon: Lollie Sails, MD;  Location: [facility] ENDOSCOPY;  Service: Endoscopy;  Laterality: N/A;   Social History Social History   Tobacco Use  . Smoking status: Former Smoker    Packs/day: 1.00    Years: 15.00    Pack years: 15.00    Types: Cigarettes    Quit date: 07/17/1978    Years since quitting: 41.7  . Smokeless tobacco: Never Used  . Tobacco comment: quit in year 1980  Vaping Use  . Vaping Use: Never used  Substance Use Topics  . Alcohol use: No  . Drug  use: No   Family History Family History  Problem Relation Age of Onset  . Breast cancer  Sister 50  . Breast cancer Sister   . Diabetes Mother   . Heart disease Mother   . Kidney disease Father   . Hypertension Father   . Fanconi anemia Sister   Positive for renal disease.  Denies peripheral artery disease or venous disease.  Allergies  Allergen Reactions  . Amoxicillin Diarrhea  . Cefdinir Diarrhea  . Doxycycline Diarrhea  . Eliquis [Apixaban] Other (See Comments)    Dizziness   . Erythromycin Diarrhea  . Lisinopril Cough  . Macrodantin [Nitrofurantoin] Diarrhea  . Mobic [Meloxicam]     diarrhea  . Statins Other (See Comments)    Joint pain  . Sulfa Antibiotics Hives   REVIEW OF SYSTEMS (Negative unless checked)  Constitutional: _0 Weight loss  _1 Fever  _2 Chills Cardiac: _3 Chest pain   _4 Chest pressure   _5 Palpitations   _6 Shortness of breath when laying flat   _7 Shortness of breath at rest   _8 Shortness of breath with exertion. Vascular:  _9 Pain in legs with walking   _10 Pain in legs at rest   _11 Pain in legs when laying flat   _12 Claudication   _13 Pain in feet when walking  _14 Pain in feet at rest  _15 Pain in feet when laying flat   _16 History of DVT   _17 Phlebitis   _18 Swelling in legs   _19 Varicose veins   _20 Non-healing ulcers Pulmonary:   _21 Uses home oxygen   _22 Productive cough   _23 Hemoptysis   _24 Wheeze  _25 COPD   _26 Asthma Neurologic:  _27 Dizziness  _28 Blackouts   _29 Seizures   _30 History of stroke   _31 History of TIA  _32 Aphasia   _33 Temporary blindness   _34 Dysphagia   _35 Weakness or numbness in arms   _36 Weakness or numbness in legs Musculoskeletal:  _37 Arthritis   _38 Joint swelling   _39 Joint pain   _40 Low back pain Hematologic:  _41 Easy bruising  _42 Easy bleeding   _43 Hypercoagulable state   _44 Anemic  _45 Hepatitis Gastrointestinal:  _46 Blood in stool   _47 Vomiting blood  _48 Gastroesophageal reflux/heartburn   _49 Difficulty swallowing. Genitourinary:  _50 Chronic kidney disease   _51 Difficult  urination  _52 Frequent urination  _53 Burning with urination   _54 Blood in urine Skin:  _55 Rashes   _56 Ulcers   _57 Wounds Psychological:  _58 History of anxiety   _59  History of major depression.  Physical Examination  Vitals:   04/18/20 2030 04/18/20 2112 04/19/20 0325 04/19/20 0754  BP:  (!) 176/63 (!) 152/49 (!) 168/54  Pulse: 72 75 69 68  Resp: (!) _60 Temp:  98.6 F (37 C) 98.2 F (36.8 C) 97.6 F (36.4 C)  TempSrc:  Oral Oral Oral  SpO2: 100% 100% 100% 100%  Weight:  77.5 kg 77.5 kg   Height:  5' (1.524 m)     Body mass index is 33.37 kg/m. Gen:  WD/WN, NAD Head: [place]/AT, No temporalis wasting. Prominent temp pulse not noted. Ear/Nose/Throat: Hearing grossly intact, nares w/o erythema or drainage, oropharynx w/o Erythema/Exudate Eyes: Sclera non-icteric, conjunctiva clear Neck: Trachea midline.  No JVD.  Pulmonary:  Good air movement, respirations not labored, equal bilaterally.  Cardiac: RRR, normal S1, S2. Vascular:  Vessel Right Left  Radial Palpable Palpable  Ulnar Palpable Palpable  Brachial Palpable Palpable  Carotid Palpable, without bruit Palpable, without bruit  Aorta Not palpable N/A  Femoral Palpable Palpable  Popliteal Palpable Palpable  PT Palpable Palpable  DP Palpable Palpable   Gastrointestinal: soft, non-tender/non-distended. No guarding/reflex.  Musculoskeletal: M/S 5/5 throughout.  Extremities without ischemic changes.  No deformity or atrophy. No edema. Neurologic: Sensation grossly intact in  extremities.  Symmetrical.  Speech is fluent. Motor exam as listed above. Psychiatric: Judgment intact, Mood & affect appropriate for pt's clinical situation. Dermatologic: No rashes or ulcers noted.  No cellulitis or open wounds. Lymph : No Cervical, Axillary, or Inguinal lymphadenopathy.  CBC Lab Results  Component Value Date   WBC 5.4 04/19/2020   HGB 8.6 (L) 04/19/2020   HCT 24.4 (L) 04/19/2020   MCV 93.8 04/19/2020   PLT 70 (L) 04/19/2020    BMET    Component Value Date/Time   NA 143 04/19/2020 0510   NA 142 02/05/2020 1505   NA 137 06/09/2012 1315   K 3.4 (L) 04/19/2020 0510   K 3.8 06/09/2012 1315   CL 112 (H) 04/19/2020 0510   CL 102 06/09/2012 1315   CO2 24 04/19/2020 0510   CO2 27 06/09/2012 1315   GLUCOSE 104 (H) 04/19/2020 0510   GLUCOSE 147 (H) 06/09/2012 1315   BUN 81 (H) 04/19/2020 0510   BUN 46 (H) 02/05/2020 1505   BUN 28 (H) 06/09/2012 1315   CREATININE 3.22 (H) 04/19/2020 0510   CREATININE 1.12 06/09/2012 1315   CALCIUM 9.2 04/19/2020 0510   CALCIUM 9.8 06/09/2012 1315   GFRNONAA 12 (L) 04/19/2020 0510   GFRNONAA 46 (L) 06/09/2012 1315   GFRAA 14 (L) 04/19/2020 0510   GFRAA 54 (L) 06/09/2012 1315   Estimated Creatinine Clearance: 11.3 mL/min (A) (by C-G formula based on SCr of 3.22 mg/dL (H)).  COAG Lab Results  Component Value Date   INR 1.19 10/31/2017   Radiology DG Chest 1 View  Result Date: 04/17/2020 CLINICAL DATA:  Shortness of breath and edema EXAM: CHEST  1 VIEW COMPARISON:  May 25, 2017 FINDINGS: The heart size and mediastinal contours are unchanged with mild cardiomegaly. Aortic knob calcifications are seen. Overlying median sternotomy wires are present. There is prominence of the central pulmonary vasculature. No large airspace consolidation or pleural effusion. No acute osseous abnormality. IMPRESSION: Mild cardiomegaly and pulmonary vascular congestion Electronically Signed   By: Prudencio Pair M.D.   On: 04/17/2020 22:27   ECHOCARDIOGRAM COMPLETE  Result Date: 04/18/2020    ECHOCARDIOGRAM REPORT   Patient Name:   Katherine Hernandez [facility] Date of Exam: 04/18/2020 Medical Rec #:  [id number]      Height:       62.0 in Accession #:    [id number]     Weight:       175.0 lb Date of Birth:  10-Jun-1930      BSA:          1.806 m Patient Age:    59 years       BP:           134/61 mmHg Patient Gender: F              HR:           70 bpm. Exam Location:  [facility] Procedure: 2D Echo Indications:     CHF  428.31  History:         Patient has prior history of Echocardiogram examinations, most                  recent 05/26/2017.  Sonographer:     Arville Go RDCS Referring Phys:  [id number] [place] Diagnosing Phys: Yolonda Kida MD  Sonographer Comments: No subcostal window. IMPRESSIONS  1. Left ventricular ejection fraction, by estimation, is 50 to 55%. The left ventricle has low normal function. The left ventricle  has no regional wall motion abnormalities. Left ventricular diastolic parameters were normal.  2. Right ventricular systolic function is normal. The right ventricular size is normal.  3. The mitral valve is myxomatous. No evidence of mitral valve regurgitation. Mild mitral stenosis. Moderate to severe mitral annular calcification.  4. The aortic valve is grossly normal. Aortic valve regurgitation is not visualized. FINDINGS  Left Ventricle: Left ventricular ejection fraction, by estimation, is 50 to 55%. The left ventricle has low normal function. The left ventricle has no regional wall motion abnormalities. The left ventricular internal cavity size was normal in size. There is no left ventricular hypertrophy. Left ventricular diastolic parameters were normal. Right Ventricle: The right ventricular size is normal. No increase in right ventricular wall thickness. Right ventricular systolic function is normal. Left Atrium: Left atrial size was normal in size. Right Atrium: Right atrial size was normal in size. Pericardium: There is no evidence of pericardial effusion. Mitral Valve: The mitral valve is myxomatous. There is moderate calcification of the mitral valve leaflet(s). Mildly decreased mobility of the mitral valve leaflets. Moderate to severe mitral annular calcification. No evidence of mitral valve regurgitation. Mild mitral valve stenosis. MV peak gradient, 15.1 mmHg. The mean mitral valve gradient is 7.0 mmHg. Tricuspid Valve: The tricuspid valve is normal in structure. Tricuspid valve  regurgitation is not demonstrated. Aortic Valve: The aortic valve is grossly normal. Aortic valve regurgitation is not visualized. Aortic valve mean gradient measures 8.0 mmHg. Aortic valve peak gradient measures 16.2 mmHg. Aortic valve area, by VTI measures 1.37 cm. Pulmonic Valve: The pulmonic valve was normal in structure. Pulmonic valve regurgitation is not visualized. Aorta: The ascending aorta was not well visualized. IAS/Shunts: No atrial level shunt detected by color flow Doppler.  LEFT VENTRICLE PLAX 2D LVIDd:         3.40 cm  Diastology LVIDs:         2.55 cm  LV e' medial:    4.79 cm/s LV PW:         1.61 cm  LV E/e' medial:  31.3 LV IVS:        1.33 cm  LV e' lateral:   7.72 cm/s LVOT diam:     1.90 cm  LV E/e' lateral: 19.4 LV SV:         68 LV SV Index:   38 LVOT Area:     2.84 cm  RIGHT VENTRICLE RV Basal diam:  2.74 cm RV S prime:     13.80 cm/s TAPSE (M-mode): 2.1 cm LEFT ATRIUM           Index       RIGHT ATRIUM           Index LA diam:      3.70 cm 2.05 cm/m  RA Area:     11.00 cm LA Vol (A2C): 36.4 ml 20.15 ml/m RA Volume:   21.90 ml  12.12 ml/m LA Vol (A4C): 50.0 ml 27.68 ml/m  AORTIC VALVE                    PULMONIC VALVE AV Area (Vmax):    1.32 cm     PV Vmax:       1.33 m/s AV Area (Vmean):   1.32 cm     PV Peak grad:  7.1 mmHg AV Area (VTI):     1.37 cm AV Vmax:           201.00 cm/s AV Vmean:  126.000 cm/s AV VTI:            0.496 m AV Peak Grad:      16.2 mmHg AV Mean Grad:      8.0 mmHg LVOT Vmax:         93.30 cm/s LVOT Vmean:        58.700 cm/s LVOT VTI:          0.239 m LVOT/AV VTI ratio: 0.48  AORTA Ao Root diam: 2.70 cm Ao Asc diam:  3.10 cm MITRAL VALVE                TRICUSPID VALVE MV Area (PHT): 3.99 cm     TV Peak grad:   30.0 mmHg MV Peak grad:  15.1 mmHg    TV Vmax:        2.74 m/s MV Mean grad:  7.0 mmHg MV Vmax:       1.94 m/s     SHUNTS MV Vmean:      127.0 cm/s   Systemic VTI:  0.24 m MV Decel Time: 190 msec     Systemic Diam: 1.90 cm MV E velocity:  150.00 cm/s MV A velocity: 140.00 cm/s MV E/A ratio:  1.07 Dwayne D Callwood MD Electronically signed by Yolonda Kida MD Signature Date/Time: 04/18/2020/11:49:38 AM    Final    Assessment/Plan Katherine Hernandez is a 82 year oldCaucasian femalewith a known history of stage V chronic kidney disease, diastolic CHF, coronary artery disease, type 2 diabetes mellitus, hypertension dyslipidemia, presented to the emergency room with acute onset of worsening dyspnea that started todayas well as orthopnea and paroxysmal nocturnal dyspnea.  With worsening chronic renal disease.  1.  Acute on chronic kidney disease: Kidney disease now progressing to stage V.  Patient is exhibiting uremic symptoms and nephrology would like to initiate dialysis at this time.  Patient does not have an adequate dialysis access.  Recommend placing a PermCath to allow the patient to dialyze in the inpatient outpatient setting.  Procedure, risks and benefits were explained to the patient and her daughter was at the bedside.  All questions were answered.  Patient and daughter wish to proceed.  2.  Anemia: Followed by her PCP and nephrologist.  3.  Diabetes: On appropriate medications. Encouraged good control as its slows the progression of atherosclerotic disease  4.  Hyperlipidemia: On Tricor for medical management Encouraged good control as its slows the progression of atherosclerotic disease  Discussed with Dr. Mayme Genta, PA-C  04/19/2020 10:42 AM  This note was created with Dragon medical transcription system.  Any error is purely unintentional

## 2020-04-19 NOTE — H&P (View-Only) (Signed)
[facility] Vascular Consult Note  MRN : [id number]  Katherine Hernandez is a 86 y.o. (1930/01/23) female who presents with chief complaint of  Chief Complaint  Patient presents with  . Shortness of Breath   History of Present Illness:  Katherine Hernandez is a 3 year oldCaucasian femalewith a known history of stage V chronic kidney disease, diastolic CHF, coronary artery disease, type 2 diabetes mellitus, hypertension dyslipidemia, presented to the emergency room with acute onset of worsening dyspnea that started todayas well as orthopnea and paroxysmal nocturnal dyspnea.   She has been having worsening lower extremity edema as well as dry cough and wheezing. She denies any fever or chills. She denies any nausea or vomiting or abdominal pain. She admitted to diarrhea which has been intermittent over the last several days, occasionally watery and loose. She would notice minimal amount of blood when she wipes. No bright red bleeding per rectum or melanotic stools. No headache or dizziness or blurred vision.  Patient with known chronic kidney disease now progressing to stage V requiring hemodialysis.  Patient does not have an adequate dialysis access to allow for hemodialysis.  Vascular surgery was consulted by Dr. Holley Raring for insertion of a PermCath.  Current Facility-Administered Medications  Medication Dose Route Frequency Provider Last Rate Last Admin  . acetaminophen (TYLENOL) tablet 650 mg  650 mg Oral Q6H PRN Mansy, Jan A, MD   650 mg at 04/18/20 2155   Or  . acetaminophen (TYLENOL) suppository 650 mg  650 mg Rectal Q6H PRN Mansy, Jan A, MD      . albuterol (PROVENTIL) (2.5 MG/3ML) 0.083% nebulizer solution 3 mL  3 mL Inhalation Q6H PRN Mansy, Jan A, MD      . allopurinol (ZYLOPRIM) tablet 100 mg  100 mg Oral Daily Max Sane, MD   100 mg at 04/18/20 0944  . amLODipine (NORVASC) tablet 5 mg  5 mg Oral Daily Mansy, Jan A, MD   5 mg at 04/18/20 0944  . Chlorhexidine  Gluconate Cloth 2 % PADS 6 each  6 each Topical Q0600 Lateef, Munsoor, MD      . dicyclomine (BENTYL) capsule 10 mg  10 mg Oral QID PRN Mansy, Jan A, MD      . furosemide (LASIX) injection 60 mg  60 mg Intravenous Q12H Mansy, Jan A, MD   60 mg at 04/19/20 8242  . gabapentin (NEURONTIN) capsule 300 mg  300 mg Oral QHS Max Sane, MD      . heparin injection 5,000 Units  5,000 Units Subcutaneous Q8H Mansy, Arvella Merles, MD   5,000 Units at 04/19/20 0028  . insulin glargine (LANTUS) injection 48 Units  48 Units Subcutaneous Daily Mansy, Jan A, MD   48 Units at 04/18/20 0945  . iron polysaccharides (NIFEREX) capsule 150 mg  150 mg Oral Daily Mansy, Jan A, MD   150 mg at 04/18/20 0944  . loratadine (CLARITIN) tablet 10 mg  10 mg Oral Daily Mansy, Jan A, MD   10 mg at 04/18/20 0944  . LORazepam (ATIVAN) tablet 0.5 mg  0.5 mg Oral Q6H PRN Mansy, Jan A, MD      . MEDLINE mouth rinse  15 mL Mouth Rinse BID Max Sane, MD   15 mL at 04/19/20 0027  . metoprolol tartrate (LOPRESSOR) tablet 25 mg  25 mg Oral BID Mansy, Jan A, MD   25 mg at 04/18/20 2145  . nitroGLYCERIN (NITROSTAT) SL tablet 0.4 mg  0.4 mg Sublingual  Q5 min PRN Mansy, Jan A, MD      . ondansetron [facility]) tablet 4 mg  4 mg Oral Q6H PRN Mansy, Jan A, MD       Or  . ondansetron [facility]) injection 4 mg  4 mg Intravenous Q6H PRN Mansy, Jan A, MD      . pantoprazole (PROTONIX) EC tablet 40 mg  40 mg Oral Daily Mansy, Jan A, MD   40 mg at 04/18/20 0944  . rOPINIRole (REQUIP) tablet 0.5 mg  0.5 mg Oral QHS Mansy, Jan A, MD   0.5 mg at 04/18/20 2144  . sertraline (ZOLOFT) tablet 50 mg  50 mg Oral Daily Mansy, Jan A, MD   50 mg at 04/18/20 0945  . sucralfate (CARAFATE) tablet 1 g  1 g Oral BID Mansy, Jan A, MD   1 g at 04/18/20 2145  . traMADol (ULTRAM) tablet 50 mg  50 mg Oral Q12H PRN Mansy, Jan A, MD      . traZODone (DESYREL) tablet 25 mg  25 mg Oral QHS PRN Mansy, Jan A, MD   25 mg at 04/18/20 2155  . triamcinolone (NASACORT) nasal inhaler 2 spray  2  spray Each Nare Daily PRN Max Sane, MD      . Derrill Memo ON 04/23/2020] Vitamin D (Ergocalciferol) (DRISDOL) capsule 50,000 Units  50,000 Units Oral Weekly Mansy, Arvella Merles, MD       Past Medical History:  Diagnosis Date  . Arthritis   . CAD (coronary artery disease)    Last nuclear 02/2009 EF 72% and neg for ischemia   . CHF (congestive heart failure) ([place])   . CKD (chronic kidney disease) 03/17/2020  . Coronary artery disease    CAD with PTCA(cfx)fx -1993,and CABG ,2004  . Diabetes mellitus without complication ([place])   . Diabetic nephropathy ([place])   . Diabetic peripheral neuropathy ([place])   . GERD (gastroesophageal reflux disease)   . Hyperlipidemia   . Hypertension   . Obesity   . Retinopathy, diabetic, background ([place])   . RLS (restless legs syndrome)   . Thrombocytopenia ([place])   . Urinary incontinence     Past Surgical History:  Procedure Laterality Date  . ABDOMINAL HYSTERECTOMY    . BONE MARROW BIOPSY    . BREAST BIOPSY Left 1970   surgical exc  . BREAST BIOPSY Right 09/13/2016   path pending x 2 areas  . CHOLECYSTECTOMY  1968  . COLONOSCOPY WITH PROPOFOL N/A 01/02/2017   Procedure: COLONOSCOPY WITH PROPOFOL;  Surgeon: Lollie Sails, MD;  Location: [facility] ENDOSCOPY;  Service: Endoscopy;  Laterality: N/A;  . CORONARY ANGIOPLASTY    . CORONARY ARTERY BYPASS GRAFT  02/2003  . ery    . ESOPHAGOGASTRODUODENOSCOPY (EGD) WITH PROPOFOL N/A 01/02/2017   Procedure: ESOPHAGOGASTRODUODENOSCOPY (EGD) WITH PROPOFOL;  Surgeon: Lollie Sails, MD;  Location: [facility] ENDOSCOPY;  Service: Endoscopy;  Laterality: N/A;   Social History Social History   Tobacco Use  . Smoking status: Former Smoker    Packs/day: 1.00    Years: 15.00    Pack years: 15.00    Types: Cigarettes    Quit date: 07/17/1978    Years since quitting: 41.7  . Smokeless tobacco: Never Used  . Tobacco comment: quit in year 1980  Vaping Use  . Vaping Use: Never used  Substance Use Topics  . Alcohol use: No  . Drug  use: No   Family History Family History  Problem Relation Age of Onset  . Breast cancer  Sister 50  . Breast cancer Sister   . Diabetes Mother   . Heart disease Mother   . Kidney disease Father   . Hypertension Father   . Fanconi anemia Sister   Positive for renal disease.  Denies peripheral artery disease or venous disease.  Allergies  Allergen Reactions  . Amoxicillin Diarrhea  . Cefdinir Diarrhea  . Doxycycline Diarrhea  . Eliquis [Apixaban] Other (See Comments)    Dizziness   . Erythromycin Diarrhea  . Lisinopril Cough  . Macrodantin [Nitrofurantoin] Diarrhea  . Mobic [Meloxicam]     diarrhea  . Statins Other (See Comments)    Joint pain  . Sulfa Antibiotics Hives   REVIEW OF SYSTEMS (Negative unless checked)  Constitutional: _0 Weight loss  _1 Fever  _2 Chills Cardiac: _3 Chest pain   _4 Chest pressure   _5 Palpitations   _6 Shortness of breath when laying flat   _7 Shortness of breath at rest   _8 Shortness of breath with exertion. Vascular:  _9 Pain in legs with walking   _10 Pain in legs at rest   _11 Pain in legs when laying flat   _12 Claudication   _13 Pain in feet when walking  _14 Pain in feet at rest  _15 Pain in feet when laying flat   _16 History of DVT   _17 Phlebitis   _18 Swelling in legs   _19 Varicose veins   _20 Non-healing ulcers Pulmonary:   _21 Uses home oxygen   _22 Productive cough   _23 Hemoptysis   _24 Wheeze  _25 COPD   _26 Asthma Neurologic:  _27 Dizziness  _28 Blackouts   _29 Seizures   _30 History of stroke   _31 History of TIA  _32 Aphasia   _33 Temporary blindness   _34 Dysphagia   _35 Weakness or numbness in arms   _36 Weakness or numbness in legs Musculoskeletal:  _37 Arthritis   _38 Joint swelling   _39 Joint pain   _40 Low back pain Hematologic:  _41 Easy bruising  _42 Easy bleeding   _43 Hypercoagulable state   _44 Anemic  _45 Hepatitis Gastrointestinal:  _46 Blood in stool   _47 Vomiting blood  _48 Gastroesophageal reflux/heartburn   _49 Difficulty swallowing. Genitourinary:  _50 Chronic kidney disease   _51 Difficult  urination  _52 Frequent urination  _53 Burning with urination   _54 Blood in urine Skin:  _55 Rashes   _56 Ulcers   _57 Wounds Psychological:  _58 History of anxiety   _59  History of major depression.  Physical Examination  Vitals:   04/18/20 2030 04/18/20 2112 04/19/20 0325 04/19/20 0754  BP:  (!) 176/63 (!) 152/49 (!) 168/54  Pulse: 72 75 69 68  Resp: (!) _60 Temp:  98.6 F (37 C) 98.2 F (36.8 C) 97.6 F (36.4 C)  TempSrc:  Oral Oral Oral  SpO2: 100% 100% 100% 100%  Weight:  77.5 kg 77.5 kg   Height:  5' (1.524 m)     Body mass index is 33.37 kg/m. Gen:  WD/WN, NAD Head: [place]/AT, No temporalis wasting. Prominent temp pulse not noted. Ear/Nose/Throat: Hearing grossly intact, nares w/o erythema or drainage, oropharynx w/o Erythema/Exudate Eyes: Sclera non-icteric, conjunctiva clear Neck: Trachea midline.  No JVD.  Pulmonary:  Good air movement, respirations not labored, equal bilaterally.  Cardiac: RRR, normal S1, S2. Vascular:  Vessel Right Left  Radial Palpable Palpable  Ulnar Palpable Palpable  Brachial Palpable Palpable  Carotid Palpable, without bruit Palpable, without bruit  Aorta Not palpable N/A  Femoral Palpable Palpable  Popliteal Palpable Palpable  PT Palpable Palpable  DP Palpable Palpable   Gastrointestinal: soft, non-tender/non-distended. No guarding/reflex.  Musculoskeletal: M/S 5/5 throughout.  Extremities without ischemic changes.  No deformity or atrophy. No edema. Neurologic: Sensation grossly intact in  extremities.  Symmetrical.  Speech is fluent. Motor exam as listed above. Psychiatric: Judgment intact, Mood & affect appropriate for pt's clinical situation. Dermatologic: No rashes or ulcers noted.  No cellulitis or open wounds. Lymph : No Cervical, Axillary, or Inguinal lymphadenopathy.  CBC Lab Results  Component Value Date   WBC 5.4 04/19/2020   HGB 8.6 (L) 04/19/2020   HCT 24.4 (L) 04/19/2020   MCV 93.8 04/19/2020   PLT 70 (L) 04/19/2020    BMET    Component Value Date/Time   NA 143 04/19/2020 0510   NA 142 02/05/2020 1505   NA 137 06/09/2012 1315   K 3.4 (L) 04/19/2020 0510   K 3.8 06/09/2012 1315   CL 112 (H) 04/19/2020 0510   CL 102 06/09/2012 1315   CO2 24 04/19/2020 0510   CO2 27 06/09/2012 1315   GLUCOSE 104 (H) 04/19/2020 0510   GLUCOSE 147 (H) 06/09/2012 1315   BUN 81 (H) 04/19/2020 0510   BUN 46 (H) 02/05/2020 1505   BUN 28 (H) 06/09/2012 1315   CREATININE 3.22 (H) 04/19/2020 0510   CREATININE 1.12 06/09/2012 1315   CALCIUM 9.2 04/19/2020 0510   CALCIUM 9.8 06/09/2012 1315   GFRNONAA 12 (L) 04/19/2020 0510   GFRNONAA 46 (L) 06/09/2012 1315   GFRAA 14 (L) 04/19/2020 0510   GFRAA 54 (L) 06/09/2012 1315   Estimated Creatinine Clearance: 11.3 mL/min (A) (by C-G formula based on SCr of 3.22 mg/dL (H)).  COAG Lab Results  Component Value Date   INR 1.19 10/31/2017   Radiology DG Chest 1 View  Result Date: 04/17/2020 CLINICAL DATA:  Shortness of breath and edema EXAM: CHEST  1 VIEW COMPARISON:  May 25, 2017 FINDINGS: The heart size and mediastinal contours are unchanged with mild cardiomegaly. Aortic knob calcifications are seen. Overlying median sternotomy wires are present. There is prominence of the central pulmonary vasculature. No large airspace consolidation or pleural effusion. No acute osseous abnormality. IMPRESSION: Mild cardiomegaly and pulmonary vascular congestion Electronically Signed   By: Prudencio Pair M.D.   On: 04/17/2020 22:27   ECHOCARDIOGRAM COMPLETE  Result Date: 04/18/2020    ECHOCARDIOGRAM REPORT   Patient Name:   Katherine Hernandez [facility] Date of Exam: 04/18/2020 Medical Rec #:  [id number]      Height:       62.0 in Accession #:    [id number]     Weight:       175.0 lb Date of Birth:  09-Oct-1929      BSA:          1.806 m Patient Age:    54 years       BP:           134/61 mmHg Patient Gender: F              HR:           70 bpm. Exam Location:  [facility] Procedure: 2D Echo Indications:     CHF  428.31  History:         Patient has prior history of Echocardiogram examinations, most                  recent 05/26/2017.  Sonographer:     Arville Go RDCS Referring Phys:  [id number] [place] Diagnosing Phys: Yolonda Kida MD  Sonographer Comments: No subcostal window. IMPRESSIONS  1. Left ventricular ejection fraction, by estimation, is 50 to 55%. The left ventricle has low normal function. The left ventricle  has no regional wall motion abnormalities. Left ventricular diastolic parameters were normal.  2. Right ventricular systolic function is normal. The right ventricular size is normal.  3. The mitral valve is myxomatous. No evidence of mitral valve regurgitation. Mild mitral stenosis. Moderate to severe mitral annular calcification.  4. The aortic valve is grossly normal. Aortic valve regurgitation is not visualized. FINDINGS  Left Ventricle: Left ventricular ejection fraction, by estimation, is 50 to 55%. The left ventricle has low normal function. The left ventricle has no regional wall motion abnormalities. The left ventricular internal cavity size was normal in size. There is no left ventricular hypertrophy. Left ventricular diastolic parameters were normal. Right Ventricle: The right ventricular size is normal. No increase in right ventricular wall thickness. Right ventricular systolic function is normal. Left Atrium: Left atrial size was normal in size. Right Atrium: Right atrial size was normal in size. Pericardium: There is no evidence of pericardial effusion. Mitral Valve: The mitral valve is myxomatous. There is moderate calcification of the mitral valve leaflet(s). Mildly decreased mobility of the mitral valve leaflets. Moderate to severe mitral annular calcification. No evidence of mitral valve regurgitation. Mild mitral valve stenosis. MV peak gradient, 15.1 mmHg. The mean mitral valve gradient is 7.0 mmHg. Tricuspid Valve: The tricuspid valve is normal in structure. Tricuspid valve  regurgitation is not demonstrated. Aortic Valve: The aortic valve is grossly normal. Aortic valve regurgitation is not visualized. Aortic valve mean gradient measures 8.0 mmHg. Aortic valve peak gradient measures 16.2 mmHg. Aortic valve area, by VTI measures 1.37 cm. Pulmonic Valve: The pulmonic valve was normal in structure. Pulmonic valve regurgitation is not visualized. Aorta: The ascending aorta was not well visualized. IAS/Shunts: No atrial level shunt detected by color flow Doppler.  LEFT VENTRICLE PLAX 2D LVIDd:         3.40 cm  Diastology LVIDs:         2.55 cm  LV e' medial:    4.79 cm/s LV PW:         1.61 cm  LV E/e' medial:  31.3 LV IVS:        1.33 cm  LV e' lateral:   7.72 cm/s LVOT diam:     1.90 cm  LV E/e' lateral: 19.4 LV SV:         68 LV SV Index:   38 LVOT Area:     2.84 cm  RIGHT VENTRICLE RV Basal diam:  2.74 cm RV S prime:     13.80 cm/s TAPSE (M-mode): 2.1 cm LEFT ATRIUM           Index       RIGHT ATRIUM           Index LA diam:      3.70 cm 2.05 cm/m  RA Area:     11.00 cm LA Vol (A2C): 36.4 ml 20.15 ml/m RA Volume:   21.90 ml  12.12 ml/m LA Vol (A4C): 50.0 ml 27.68 ml/m  AORTIC VALVE                    PULMONIC VALVE AV Area (Vmax):    1.32 cm     PV Vmax:       1.33 m/s AV Area (Vmean):   1.32 cm     PV Peak grad:  7.1 mmHg AV Area (VTI):     1.37 cm AV Vmax:           201.00 cm/s AV Vmean:  126.000 cm/s AV VTI:            0.496 m AV Peak Grad:      16.2 mmHg AV Mean Grad:      8.0 mmHg LVOT Vmax:         93.30 cm/s LVOT Vmean:        58.700 cm/s LVOT VTI:          0.239 m LVOT/AV VTI ratio: 0.48  AORTA Ao Root diam: 2.70 cm Ao Asc diam:  3.10 cm MITRAL VALVE                TRICUSPID VALVE MV Area (PHT): 3.99 cm     TV Peak grad:   30.0 mmHg MV Peak grad:  15.1 mmHg    TV Vmax:        2.74 m/s MV Mean grad:  7.0 mmHg MV Vmax:       1.94 m/s     SHUNTS MV Vmean:      127.0 cm/s   Systemic VTI:  0.24 m MV Decel Time: 190 msec     Systemic Diam: 1.90 cm MV E velocity:  150.00 cm/s MV A velocity: 140.00 cm/s MV E/A ratio:  1.07 Dwayne D Callwood MD Electronically signed by Yolonda Kida MD Signature Date/Time: 04/18/2020/11:49:38 AM    Final    Assessment/Plan Katherine Hernandez is a 82 year oldCaucasian femalewith a known history of stage V chronic kidney disease, diastolic CHF, coronary artery disease, type 2 diabetes mellitus, hypertension dyslipidemia, presented to the emergency room with acute onset of worsening dyspnea that started todayas well as orthopnea and paroxysmal nocturnal dyspnea.  With worsening chronic renal disease.  1.  Acute on chronic kidney disease: Kidney disease now progressing to stage V.  Patient is exhibiting uremic symptoms and nephrology would like to initiate dialysis at this time.  Patient does not have an adequate dialysis access.  Recommend placing a PermCath to allow the patient to dialyze in the inpatient outpatient setting.  Procedure, risks and benefits were explained to the patient and her daughter was at the bedside.  All questions were answered.  Patient and daughter wish to proceed.  2.  Anemia: Followed by her PCP and nephrologist.  3.  Diabetes: On appropriate medications. Encouraged good control as its slows the progression of atherosclerotic disease  4.  Hyperlipidemia: On Tricor for medical management Encouraged good control as its slows the progression of atherosclerotic disease  Discussed with Dr. Mayme Genta, PA-C  04/19/2020 10:42 AM  This note was created with Dragon medical transcription system.  Any error is purely unintentional

## 2020-04-19 NOTE — Progress Notes (Signed)
   04/19/20 1945  Assess: MEWS Score  Temp (!) 96.6 F (35.9 C)  BP (!) 109/56  Pulse Rate 66  Assess: MEWS Score  MEWS Temp 1  MEWS Systolic 0  MEWS Pulse 0  MEWS RR 0  MEWS LOC 1  MEWS Score 2  MEWS Score Color Yellow    Pt currently in dialysis.

## 2020-04-19 NOTE — Op Note (Signed)
OPERATIVE NOTE    PRE-OPERATIVE DIAGNOSIS: 1. ESRD   POST-OPERATIVE DIAGNOSIS: same as above  PROCEDURE: 1. Ultrasound guidance for vascular access to the right internal jugular vein 2. Fluoroscopic guidance for placement of catheter 3. Placement of a 19 cm tip to cuff tunneled hemodialysis catheter via the right internal jugular vein  SURGEON: Leotis Pain, MD  ANESTHESIA:  Local with Moderate conscious sedation for approximately 17 minutes using 1 mg of Versed and 25 mcg of Fentanyl  ESTIMATED BLOOD LOSS: 10 cc  FLUORO TIME: less than one minute  CONTRAST: none  FINDING(S): 1.  Patent right internal jugular vein  SPECIMEN(S):  None  INDICATIONS:   Katherine Hernandez is a 86 y.o.female who presents with renal failure.  The patient needs long term dialysis access for their ESRD, and a Permcath is necessary.  Risks and benefits are discussed and informed consent is obtained.    DESCRIPTION: After obtaining full informed written consent, the patient was brought back to the vascular suited. The patient's right neck and chest were sterilely prepped and draped in a sterile surgical field was created. Moderate conscious sedation was administered during a face to face encounter with the patient throughout the procedure with my supervision of the RN administering medicines and monitoring the patient's vital signs, pulse oximetry, telemetry and mental status throughout from the start of the procedure until the patient was taken to the recovery room.  The right internal jugular vein was visualized with ultrasound and found to be patent. It was then accessed under direct ultrasound guidance and a permanent image was recorded. A wire was placed. After skin nick and dilatation, the peel-away sheath was placed over the wire. I then turned my attention to an area under the clavicle. Approximately 1-2 fingerbreadths below the clavicle a small counterincision was created and tunneled from the subclavicular  incision to the access site. Using fluoroscopic guidance, a 19 centimeter tip to cuff tunneled hemodialysis catheter was selected, and tunneled from the subclavicular incision to the access site. It was then placed through the peel-away sheath and the peel-away sheath was removed. Using fluoroscopic guidance the catheter tips were parked in the right atrium. The appropriate distal connectors were placed. It withdrew blood well and flushed easily with heparinized saline and a concentrated heparin solution was then placed. It was secured to the chest wall with 2 Prolene sutures. The access incision was closed single 4-0 Monocryl. A 4-0 Monocryl pursestring suture was placed around the exit site. Sterile dressings were placed. The patient tolerated the procedure well and was taken to the recovery room in stable condition.  COMPLICATIONS: None  CONDITION: Stable  Leotis Pain, MD 04/19/2020 4:30 PM   This note was created with Dragon Medical transcription system. Any errors in dictation are purely unintentional.

## 2020-04-19 NOTE — Progress Notes (Signed)
1       PROGRESS NOTE    Katherine Hernandez  KGU:[id number] DOB: 14-Jun-1930 DOA: 04/17/2020 PCP: Tracie Harrier, MD   Brief Narrative:   Katherine Hernandez  is a 86 y.o. Caucasian femalewith a known history of stage V chronic kidney disease, diastolic CHF, coronary artery disease, type 2 diabetes mellitus, hypertension dyslipidemia admitted for acute onset of worsening dyspnea as well as orthopnea and paroxysmal nocturnal dyspnea. She has been having worsening lower extremity edema as well as dry cough and wheezing. She admitted to diarrhea which has been intermittent over the last several days, occasionally watery and loose. She would notice minimal amount of blood when she wipes. No bright red bleeding per rectum or melanotic stools.   While in the emergency room, blood pressure was 81/63 with respiratory rate of 24 and otherwise normal vital signs. Blood pressure later on was up to 171/60 then 168/61. CMP remarkable for mild hypokalemia of 3.4 BUN of 90 and creatinine 3.81 compared to 74 and 3.61 on 03/17/2020. CBC showed anemia close to her baseline with hemoglobin 8.8 and hematocrit 25.6. Chest x-ray showed mild cardiomegaly with pulmonary vascular congestion.  The patient was given 80 mg IV Lasix and 40 mEq p.o. potassium chloride.   Assessment & Plan:   Active Problems:   Acute CHF (congestive heart failure) ([place])  1. Acute on chronic diastolic CHF. -Continue IV Lasix 60 mg twice daily.  -Strict I's and O's, daily weight.  -3 L fluid balance -Cardiology Dr. Clayborn Bigness following  2. Acute kidney injury superimposed on chronic kidney disease, stage V.  This could be prerenal secondary to acute CHF and partly related to diarrhea. -Patient has uremic symptom will need permacath tomorrow to start planning for dialysis per nephrology -Vascular surgery aware, nephrology following  3. Hypokalemia. -Repleted and resolved  4.  Anemia of chronic kidney disease.  She has associated  diarrhea. -Stable H&H.  No endovascular evaluation per GI -hematochezia likely due to hemorrhoids  5. Essential hypertension. -continue Lopressor and hold off Cozaar given acute kidney injury. -Increase dose of Norvasc from 5->10 mg for better blood pressure control  6. Gout. -continue allopurinol.  7. Dyslipidemia. -continue TriCor.  8. Restless leg syndrome. - continue Requip.  9. GERD. -continue PPI therapy.  10. Type 2 diabetes mellitus. -Sliding scale insulin  11.  Secondary hyperparathyroidism: Due to underlying CKD  12.  Obesity Body mass index is 33.37 kg/m.      DVT prophylaxis: heparin injection 5,000 Units Start: 04/17/20 2330     Code Status: Full Code Family Communication: Discussed with patient, her daughter is updated bedside Disposition Plan: In next 4 to 5 days depending on nephrology evaluation and dialysis need. Status is: Inpatient  Remains inpatient appropriate because:Inpatient level of care appropriate due to severity of illness   Dispo: The patient is from: Home              Anticipated d/c is to: Home              Anticipated d/c date is: > 3 days              Patient currently is not medically stable to d/c.  Consultants:   Nephrology  Vascular surgery  GI  Cardiology   Subjective: No new complaints.  Hard of hearing.  Daughter at bedside.  Waiting to get permacath placed today.  Blood pressure high.  Objective: Vitals:   04/18/20 2112 04/19/20 0325 04/19/20 0754 04/19/20 1110  BP: (!) 176/63 (!) 152/49 (!) 168/54 (!) 170/56  Pulse: 75 69 68 68  Resp: 19 17 17 16   Temp: 98.6 F (37 C) 98.2 F (36.8 C) 97.6 F (36.4 C) 98.3 F (36.8 C)  TempSrc: Oral Oral Oral   SpO2: 100% 100% 100% 100%  Weight: 77.5 kg 77.5 kg    Height: 5' (1.524 m)       Intake/Output Summary (Last 24 hours) at 04/19/2020 1344 Last data filed at 04/19/2020 1028 Gross per 24 hour  Intake 120 ml  Output 1800 ml  Net -1680 ml    Filed Weights   04/17/20 2158 04/18/20 2112 04/19/20 0325  Weight: 79.4 kg 77.5 kg 77.5 kg    Examination:  General exam: Appears calm and comfortable  Respiratory system: Clear to auscultation. Respiratory effort normal. Cardiovascular system: S1 & S2 heard, RRR. No JVD, murmurs, rubs, gallops or clicks. No pedal edema. Gastrointestinal system: Abdomen is nondistended, soft and nontender. No organomegaly or masses felt. Normal bowel sounds heard. Central nervous system: Alert and oriented. No focal neurological deficits. Extremities: Symmetric 5 x 5 power. Skin: No rashes, lesions or ulcers Psychiatry: Judgement and insight appear normal. Mood & affect appropriate.     Data Reviewed: I have personally reviewed following labs and imaging studies  CBC: Recent Labs  Lab 04/17/20 2204 04/19/20 0510  WBC 7.6 5.4  HGB 8.8* 8.6*  HCT 25.6* 24.4*  MCV 94.1 93.8  PLT 99* 70*   Basic Metabolic Panel: Recent Labs  Lab 04/17/20 2204 04/18/20 0407 04/19/20 0510  NA 142 142 143  K 3.4* 3.5 3.4*  CL 115* 117* 112*  CO2 20* 17* 24  GLUCOSE 142* 98 104*  BUN 90* 90* 81*  CREATININE 3.81* 3.70* 3.22*  CALCIUM 9.5 8.8* 9.2  MG 1.9  --   --    GFR: Estimated Creatinine Clearance: 11.3 mL/min (A) (by C-G formula based on SCr of 3.22 mg/dL (H)). Liver Function Tests: Recent Labs  Lab 04/17/20 2204  AST 34  ALT 26  ALKPHOS 67  BILITOT 0.8  PROT 7.0  ALBUMIN 3.9   No results for input(s): LIPASE, AMYLASE in the last 168 hours. No results for input(s): AMMONIA in the last 168 hours. Coagulation Profile: No results for input(s): INR, PROTIME in the last 168 hours. Cardiac Enzymes: No results for input(s): CKTOTAL, CKMB, CKMBINDEX, TROPONINI in the last 168 hours. BNP (last 3 results) No results for input(s): PROBNP in the last 8760 hours. HbA1C: No results for input(s): HGBA1C in the last 72 hours. CBG: No results for input(s): GLUCAP in the last 168 hours. Lipid  Profile: No results for input(s): CHOL, HDL, LDLCALC, TRIG, CHOLHDL, LDLDIRECT in the last 72 hours. Thyroid Function Tests: No results for input(s): TSH, T4TOTAL, FREET4, T3FREE, THYROIDAB in the last 72 hours. Anemia Panel: No results for input(s): VITAMINB12, FOLATE, FERRITIN, TIBC, IRON, RETICCTPCT in the last 72 hours. Sepsis Labs: Recent Labs  Lab 04/17/20 2204 04/18/20 0018  PROCALCITON <0.10  --   LATICACIDVEN 1.4 1.4    Recent Results (from the past 240 hour(s))  Respiratory Panel by RT PCR (Flu A&B, Covid) - Nasopharyngeal Swab     Status: None   Collection Time: 04/17/20 10:35 PM   Specimen: Nasopharyngeal Swab  Result Value Ref Range Status   SARS Coronavirus 2 by RT PCR NEGATIVE NEGATIVE Final    Comment: (NOTE) SARS-CoV-2 target nucleic acids are NOT DETECTED.  The SARS-CoV-2 RNA is generally detectable in upper respiratoy  specimens during the acute phase of infection. The lowest concentration of SARS-CoV-2 viral copies this assay can detect is 131 copies/mL. A negative result does not preclude SARS-Cov-2 infection and should not be used as the sole basis for treatment or other patient management decisions. A negative result may occur with  improper specimen collection/handling, submission of specimen other than nasopharyngeal swab, presence of viral mutation(s) within the areas targeted by this assay, and inadequate number of viral copies (<131 copies/mL). A negative result must be combined with clinical observations, patient history, and epidemiological information. The expected result is Negative.  Fact Sheet for Patients:  PinkCheek.be  Fact Sheet for Healthcare Providers:  GravelBags.it  This test is no t yet approved or cleared by the [place] FDA and  has been authorized for detection and/or diagnosis of SARS-CoV-2 by FDA under an Emergency Use Authorization (EUA). This EUA will remain  in  effect (meaning this test can be used) for the duration of the COVID-19 declaration under Section 564(b)(1) of the Act, 21 U.S.C. section 360bbb-3(b)(1), unless the authorization is terminated or revoked sooner.     Influenza A by PCR NEGATIVE NEGATIVE Final   Influenza B by PCR NEGATIVE NEGATIVE Final    Comment: (NOTE) The Xpert Xpress SARS-CoV-2/FLU/RSV assay is intended as an aid in  the diagnosis of influenza from Nasopharyngeal swab specimens and  should not be used as a sole basis for treatment. Nasal washings and  aspirates are unacceptable for Xpert Xpress SARS-CoV-2/FLU/RSV  testing.  Fact Sheet for Patients: PinkCheek.be  Fact Sheet for Healthcare Providers: GravelBags.it  This test is not yet approved or cleared by the [place] FDA and  has been authorized for detection and/or diagnosis of SARS-CoV-2 by  FDA under an Emergency Use Authorization (EUA). This EUA will remain  in effect (meaning this test can be used) for the duration of the  Covid-19 declaration under Section 564(b)(1) of the Act, 21  U.S.C. section 360bbb-3(b)(1), unless the authorization is  terminated or revoked. Performed at [facility], [address] [place], [place] [address]   Gastrointestinal Panel by PCR , Stool     Status: None   Collection Time: 04/18/20  9:49 AM   Specimen: Stool  Result Value Ref Range Status   Campylobacter species NOT DETECTED NOT DETECTED Final   Plesimonas shigelloides NOT DETECTED NOT DETECTED Final   Salmonella species NOT DETECTED NOT DETECTED Final   Yersinia enterocolitica NOT DETECTED NOT DETECTED Final   Vibrio species NOT DETECTED NOT DETECTED Final   Vibrio cholerae NOT DETECTED NOT DETECTED Final   Enteroaggregative E coli (EAEC) NOT DETECTED NOT DETECTED Final   Enteropathogenic E coli (EPEC) NOT DETECTED NOT DETECTED Final   Enterotoxigenic E coli (ETEC) NOT DETECTED NOT DETECTED  Final   Shiga like toxin producing E coli (STEC) NOT DETECTED NOT DETECTED Final   Shigella/Enteroinvasive E coli (EIEC) NOT DETECTED NOT DETECTED Final   Cryptosporidium NOT DETECTED NOT DETECTED Final   Cyclospora cayetanensis NOT DETECTED NOT DETECTED Final   Entamoeba histolytica NOT DETECTED NOT DETECTED Final   Giardia lamblia NOT DETECTED NOT DETECTED Final   Adenovirus F40/41 NOT DETECTED NOT DETECTED Final   Astrovirus NOT DETECTED NOT DETECTED Final   Norovirus GI/GII NOT DETECTED NOT DETECTED Final   Rotavirus A NOT DETECTED NOT DETECTED Final   Sapovirus (I, II, IV, and V) NOT DETECTED NOT DETECTED Final    Comment: Performed at [facility], [address] [place], [place]  [address]         Radiology Studies: DG Chest 1 View  Result Date: 04/17/2020 CLINICAL DATA:  Shortness of breath and edema EXAM: CHEST  1 VIEW COMPARISON:  May 25, 2017 FINDINGS: The heart size and mediastinal contours are unchanged with mild cardiomegaly. Aortic knob calcifications are seen. Overlying median sternotomy wires are present. There is prominence of the central pulmonary vasculature. No large airspace consolidation or pleural effusion. No acute osseous abnormality. IMPRESSION: Mild cardiomegaly and pulmonary vascular congestion Electronically Signed   By: Prudencio Pair M.D.   On: 04/17/2020 22:27   ECHOCARDIOGRAM COMPLETE  Result Date: 04/18/2020    ECHOCARDIOGRAM REPORT   Patient Name:   JAE BRUCK [facility] Date of Exam: 04/18/2020 Medical Rec #:  [id number]      Height:       62.0 in Accession #:    [id number]     Weight:       175.0 lb Date of Birth:  1930/05/08      BSA:          1.806 m Patient Age:    62 years       BP:           134/61 mmHg Patient Gender: F              HR:           70 bpm. Exam Location:  [facility] Procedure: 2D Echo Indications:     CHF 428.31  History:         Patient has prior history of Echocardiogram examinations, most                  recent 05/26/2017.   Sonographer:     Arville Go RDCS Referring Phys:  [id number] [place] Diagnosing Phys: Yolonda Kida MD  Sonographer Comments: No subcostal window. IMPRESSIONS  1. Left ventricular ejection fraction, by estimation, is 50 to 55%. The left ventricle has low normal function. The left ventricle has no regional wall motion abnormalities. Left ventricular diastolic parameters were normal.  2. Right ventricular systolic function is normal. The right ventricular size is normal.  3. The mitral valve is myxomatous. No evidence of mitral valve regurgitation. Mild mitral stenosis. Moderate to severe mitral annular calcification.  4. The aortic valve is grossly normal. Aortic valve regurgitation is not visualized. FINDINGS  Left Ventricle: Left ventricular ejection fraction, by estimation, is 50 to 55%. The left ventricle has low normal function. The left ventricle has no regional wall motion abnormalities. The left ventricular internal cavity size was normal in size. There is no left ventricular hypertrophy. Left ventricular diastolic parameters were normal. Right Ventricle: The right ventricular size is normal. No increase in right ventricular wall thickness. Right ventricular systolic function is normal. Left Atrium: Left atrial size was normal in size. Right Atrium: Right atrial size was normal in size. Pericardium: There is no evidence of pericardial effusion. Mitral Valve: The mitral valve is myxomatous. There is moderate calcification of the mitral valve leaflet(s). Mildly decreased mobility of the mitral valve leaflets. Moderate to severe mitral annular calcification. No evidence of mitral valve regurgitation. Mild mitral valve stenosis. MV peak gradient, 15.1 mmHg. The mean mitral valve gradient is 7.0 mmHg. Tricuspid Valve: The tricuspid valve is normal in structure. Tricuspid valve regurgitation is not demonstrated. Aortic Valve: The aortic valve is grossly normal. Aortic valve regurgitation is not  visualized. Aortic valve mean gradient measures 8.0 mmHg. Aortic valve peak gradient measures  16.2 mmHg. Aortic valve area, by VTI measures 1.37 cm. Pulmonic Valve: The pulmonic valve was normal in structure. Pulmonic valve regurgitation is not visualized. Aorta: The ascending aorta was not well visualized. IAS/Shunts: No atrial level shunt detected by color flow Doppler.  LEFT VENTRICLE PLAX 2D LVIDd:         3.40 cm  Diastology LVIDs:         2.55 cm  LV e' medial:    4.79 cm/s LV PW:         1.61 cm  LV E/e' medial:  31.3 LV IVS:        1.33 cm  LV e' lateral:   7.72 cm/s LVOT diam:     1.90 cm  LV E/e' lateral: 19.4 LV SV:         68 LV SV Index:   38 LVOT Area:     2.84 cm  RIGHT VENTRICLE RV Basal diam:  2.74 cm RV S prime:     13.80 cm/s TAPSE (M-mode): 2.1 cm LEFT ATRIUM           Index       RIGHT ATRIUM           Index LA diam:      3.70 cm 2.05 cm/m  RA Area:     11.00 cm LA Vol (A2C): 36.4 ml 20.15 ml/m RA Volume:   21.90 ml  12.12 ml/m LA Vol (A4C): 50.0 ml 27.68 ml/m  AORTIC VALVE                    PULMONIC VALVE AV Area (Vmax):    1.32 cm     PV Vmax:       1.33 m/s AV Area (Vmean):   1.32 cm     PV Peak grad:  7.1 mmHg AV Area (VTI):     1.37 cm AV Vmax:           201.00 cm/s AV Vmean:          126.000 cm/s AV VTI:            0.496 m AV Peak Grad:      16.2 mmHg AV Mean Grad:      8.0 mmHg LVOT Vmax:         93.30 cm/s LVOT Vmean:        58.700 cm/s LVOT VTI:          0.239 m LVOT/AV VTI ratio: 0.48  AORTA Ao Root diam: 2.70 cm Ao Asc diam:  3.10 cm MITRAL VALVE                TRICUSPID VALVE MV Area (PHT): 3.99 cm     TV Peak grad:   30.0 mmHg MV Peak grad:  15.1 mmHg    TV Vmax:        2.74 m/s MV Mean grad:  7.0 mmHg MV Vmax:       1.94 m/s     SHUNTS MV Vmean:      127.0 cm/s   Systemic VTI:  0.24 m MV Decel Time: 190 msec     Systemic Diam: 1.90 cm MV E velocity: 150.00 cm/s MV A velocity: 140.00 cm/s MV E/A ratio:  1.07 Dwayne D Callwood MD Electronically signed by Yolonda Kida  MD Signature Date/Time: 04/18/2020/11:49:38 AM    Final         Scheduled Meds: . allopurinol  100 mg Oral Daily  . amLODipine  5 mg Oral Daily  . Chlorhexidine Gluconate Cloth  6 each Topical Q0600  . furosemide  60 mg Intravenous Q12H  . gabapentin  300 mg Oral QHS  . heparin  5,000 Units Subcutaneous Q8H  . insulin glargine  48 Units Subcutaneous Daily  . iron polysaccharides  150 mg Oral Daily  . loratadine  10 mg Oral Daily  . mouth rinse  15 mL Mouth Rinse BID  . metoprolol tartrate  25 mg Oral BID  . pantoprazole  40 mg Oral Daily  . rOPINIRole  0.5 mg Oral QHS  . sertraline  50 mg Oral Daily  . sucralfate  1 g Oral BID  . [START ON 04/23/2020] Vitamin D (Ergocalciferol)  50,000 Units Oral Weekly   Continuous Infusions: . sodium chloride    . sodium chloride       LOS: 2 days    Time spent: 35 minutes    Max Sane, MD [facility] Pager [phone]  If 7PM-7AM, please contact night-coverage [email or web address] Password [email or web address] 04/19/2020, 1:44 PM

## 2020-04-19 NOTE — Progress Notes (Signed)
Mobility Specialist - Progress Note   04/19/20 1229  Mobility  Activity Ambulated in room;Ambulated to bathroom  Level of Assistance Standby assist, set-up cues, supervision of patient - no hands on (CGA utilized for safety)  Assistive Device Four wheel walker  Distance Ambulated (ft) 50 ft  Mobility Response Tolerated well  Mobility performed by Mobility specialist  $Mobility charge 1 Mobility    Pt asleep in bed upon arrival. Pt easily awakens. Pt agreed to session. Pt transitioned from supine to sit w/ mod. Assist for UE. Pt ambulated 50' total in room and to bathroom using her personal 4WW w/ SBA. CGA utilized for safety. Pt was mod. independent w/ personal hygiene after using the bathroom. Pt had a BM and a urinal output of 200 mL. Pt had a steady gait t/o session. No LOB noted. Slight SOB, pt O2 >97% t/o session. Pt was on 2L O2 [place] t/o session. Overall, pt tolerated session very well. Pt pleasant t/o session. Pt left laying in bed w/ all needs placed in reach. Nurse entered the room at the end of session.    Harshika Mago Mobility Specialist  04/19/20, 12:37 PM

## 2020-04-19 NOTE — Progress Notes (Signed)
[facility]    SUBJECTIVE: Patient states to be doing reasonably well no chest pain still has some shortness of breath still has some trouble with her blood pressure no palpitations or tachycardia noted.  Patient ambulated  in the room to and from the bathroom   Vitals:   04/18/20 1100 04/18/20 2030 04/18/20 2112 04/19/20 0325  BP: (!) 159/57  (!) 176/63 (!) 152/49  Pulse: 67 72 75 69  Resp: 18 (!) 24 19 17   Temp:   98.6 F (37 C) 98.2 F (36.8 C)  TempSrc:   Oral Oral  SpO2: 100% 100% 100% 100%  Weight:   77.5 kg 77.5 kg  Height:   5' (1.524 m)      Intake/Output Summary (Last 24 hours) at 04/19/2020 0732 Last data filed at 04/19/2020 5361 Gross per 24 hour  Intake 120 ml  Output 2200 ml  Net -2080 ml      PHYSICAL EXAM  General: Well developed, well nourished, in no acute distress HEENT:  Normocephalic and atramatic Neck:  No JVD.  Lungs: Clear bilaterally to auscultation and percussion. Heart: HRRR . Normal S1 and S2 without gallops or murmurs.  Abdomen: Bowel sounds are positive, abdomen soft and non-tender  Msk:  Back normal, normal gait. Normal strength and tone for age. Extremities: No clubbing, cyanosis or edema.   Neuro: Alert and oriented X 3. Psych:  Good affect, responds appropriately   LABS: Basic Metabolic Panel: Recent Labs    04/17/20 2204 04/18/20 0407  NA 142 142  K 3.4* 3.5  CL 115* 117*  CO2 20* 17*  GLUCOSE 142* 98  BUN 90* 90*  CREATININE 3.81* 3.70*  CALCIUM 9.5 8.8*  MG 1.9  --    Liver Function Tests: Recent Labs    04/17/20 2204  AST 34  ALT 26  ALKPHOS 67  BILITOT 0.8  PROT 7.0  ALBUMIN 3.9   No results for input(s): LIPASE, AMYLASE in the last 72 hours. CBC: Recent Labs    04/17/20 2204  WBC 7.6  HGB 8.8*  HCT 25.6*  MCV 94.1  PLT 99*   Cardiac Enzymes: No results for input(s): CKTOTAL, CKMB, CKMBINDEX, TROPONINI in the last 72 hours. BNP: Invalid input(s): POCBNP D-Dimer: No results for input(s):  DDIMER in the last 72 hours. Hemoglobin A1C: No results for input(s): HGBA1C in the last 72 hours. Fasting Lipid Panel: No results for input(s): CHOL, HDL, LDLCALC, TRIG, CHOLHDL, LDLDIRECT in the last 72 hours. Thyroid Function Tests: No results for input(s): TSH, T4TOTAL, T3FREE, THYROIDAB in the last 72 hours.  Invalid input(s): FREET3 Anemia Panel: No results for input(s): VITAMINB12, FOLATE, FERRITIN, TIBC, IRON, RETICCTPCT in the last 72 hours.  DG Chest 1 View  Result Date: 04/17/2020 CLINICAL DATA:  Shortness of breath and edema EXAM: CHEST  1 VIEW COMPARISON:  May 25, 2017 FINDINGS: The heart size and mediastinal contours are unchanged with mild cardiomegaly. Aortic knob calcifications are seen. Overlying median sternotomy wires are present. There is prominence of the central pulmonary vasculature. No large airspace consolidation or pleural effusion. No acute osseous abnormality. IMPRESSION: Mild cardiomegaly and pulmonary vascular congestion Electronically Signed   By: Prudencio Pair M.D.   On: 04/17/2020 22:27   ECHOCARDIOGRAM COMPLETE  Result Date: 04/18/2020    ECHOCARDIOGRAM REPORT   Patient Name:   Katherine Hernandez [facility] Date of Exam: 04/18/2020 Medical Rec #:  [id number]      Height:       62.0 in Accession #:  [id number]     Weight:       175.0 lb Date of Birth:  [birth year]      BSA:          1.806 m Patient Age:    86 years       BP:           134/61 mmHg Patient Gender: F              HR:           70 bpm. Exam Location:  [facility] Procedure: 2D Echo Indications:     CHF 428.31  History:         Patient has prior history of Echocardiogram examinations, most                  recent 05/26/2017.  Sonographer:     Arville Go RDCS Referring Phys:  [id number] [place] Diagnosing Phys: Yolonda Kida MD  Sonographer Comments: No subcostal window. IMPRESSIONS  1. Left ventricular ejection fraction, by estimation, is 50 to 55%. The left ventricle has low normal function. The left ventricle  has no regional wall motion abnormalities. Left ventricular diastolic parameters were normal.  2. Right ventricular systolic function is normal. The right ventricular size is normal.  3. The mitral valve is myxomatous. No evidence of mitral valve regurgitation. Mild mitral stenosis. Moderate to severe mitral annular calcification.  4. The aortic valve is grossly normal. Aortic valve regurgitation is not visualized. FINDINGS  Left Ventricle: Left ventricular ejection fraction, by estimation, is 50 to 55%. The left ventricle has low normal function. The left ventricle has no regional wall motion abnormalities. The left ventricular internal cavity size was normal in size. There is no left ventricular hypertrophy. Left ventricular diastolic parameters were normal. Right Ventricle: The right ventricular size is normal. No increase in right ventricular wall thickness. Right ventricular systolic function is normal. Left Atrium: Left atrial size was normal in size. Right Atrium: Right atrial size was normal in size. Pericardium: There is no evidence of pericardial effusion. Mitral Valve: The mitral valve is myxomatous. There is moderate calcification of the mitral valve leaflet(s). Mildly decreased mobility of the mitral valve leaflets. Moderate to severe mitral annular calcification. No evidence of mitral valve regurgitation. Mild mitral valve stenosis. MV peak gradient, 15.1 mmHg. The mean mitral valve gradient is 7.0 mmHg. Tricuspid Valve: The tricuspid valve is normal in structure. Tricuspid valve regurgitation is not demonstrated. Aortic Valve: The aortic valve is grossly normal. Aortic valve regurgitation is not visualized. Aortic valve mean gradient measures 8.0 mmHg. Aortic valve peak gradient measures 16.2 mmHg. Aortic valve area, by VTI measures 1.37 cm. Pulmonic Valve: The pulmonic valve was normal in structure. Pulmonic valve regurgitation is not visualized. Aorta: The ascending aorta was not well visualized.  IAS/Shunts: No atrial level shunt detected by color flow Doppler.  LEFT VENTRICLE PLAX 2D LVIDd:         3.40 cm  Diastology LVIDs:         2.55 cm  LV e' medial:    4.79 cm/s LV PW:         1.61 cm  LV E/e' medial:  31.3 LV IVS:        1.33 cm  LV e' lateral:   7.72 cm/s LVOT diam:     1.90 cm  LV E/e' lateral: 19.4 LV SV:         68 LV SV Index:   38 LVOT Area:  2.84 cm  RIGHT VENTRICLE RV Basal diam:  2.74 cm RV S prime:     13.80 cm/s TAPSE (M-mode): 2.1 cm LEFT ATRIUM           Index       RIGHT ATRIUM           Index LA diam:      3.70 cm 2.05 cm/m  RA Area:     11.00 cm LA Vol (A2C): 36.4 ml 20.15 ml/m RA Volume:   21.90 ml  12.12 ml/m LA Vol (A4C): 50.0 ml 27.68 ml/m  AORTIC VALVE                    PULMONIC VALVE AV Area (Vmax):    1.32 cm     PV Vmax:       1.33 m/s AV Area (Vmean):   1.32 cm     PV Peak grad:  7.1 mmHg AV Area (VTI):     1.37 cm AV Vmax:           201.00 cm/s AV Vmean:          126.000 cm/s AV VTI:            0.496 m AV Peak Grad:      16.2 mmHg AV Mean Grad:      8.0 mmHg LVOT Vmax:         93.30 cm/s LVOT Vmean:        58.700 cm/s LVOT VTI:          0.239 m LVOT/AV VTI ratio: 0.48  AORTA Ao Root diam: 2.70 cm Ao Asc diam:  3.10 cm MITRAL VALVE                TRICUSPID VALVE MV Area (PHT): 3.99 cm     TV Peak grad:   30.0 mmHg MV Peak grad:  15.1 mmHg    TV Vmax:        2.74 m/s MV Mean grad:  7.0 mmHg MV Vmax:       1.94 m/s     SHUNTS MV Vmean:      127.0 cm/s   Systemic VTI:  0.24 m MV Decel Time: 190 msec     Systemic Diam: 1.90 cm MV E velocity: 150.00 cm/s MV A velocity: 140.00 cm/s MV E/A ratio:  1.07 Keilen Kahl D Torsha Lemus MD Electronically signed by Yolonda Kida MD Signature Date/Time: 04/18/2020/11:49:38 AM    Final      Echo preserved left ventricular function EF of at least 60%  TELEMETRY: Normal sinus rhythm nonspecific T changes rate of 65  ASSESSMENT AND PLAN:  Active Problems:   Acute CHF (congestive heart failure) (HCC) Acute on chronic renal  failure Shortness of breath dyspnea Congestive heart failure diastolic dysfunction Known coronary artery disease history of PCI and stent Diabetes type 2 uncomplicated GERD Hyperlipidemia Hypertension History of coronary bypass surgery distant past  Plan Agree with admit to telemetry follow-up EKGs troponins Agree with nephrology input for severe renal insufficiency Consider dialysis therapy to help with diuresis Continue blood pressure management and control Supplemental oxygen as necessary Maintain diabetes management and control Defer any invasive cardiac procedures because of significant renal insufficiency Conservative cardiac input at this point   Yolonda Kida, MD 04/19/2020 7:32 AM

## 2020-04-19 NOTE — Progress Notes (Signed)
[facility]  ROUNDING NOTE   Subjective:  Patient found sitting at the side of the bed, family member at the bedside.  Discussed need for dialysis with patient. Patient and family in agreement.  Will start dialysis today after permacath placement.   Objective:  Vital signs in last 24 hours:  Temp:  [97.6 F (36.4 C)-98.6 F (37 C)] 98.3 F (36.8 C) (10/04 1110) Pulse Rate:  [68-75] 68 (10/04 1110) Resp:  [16-24] 16 (10/04 1110) BP: (152-176)/(49-63) 170/56 (10/04 1110) SpO2:  [100 %] 100 % (10/04 1110) Weight:  [77.5 kg] 77.5 kg (10/04 0325)  Weight change: -1.905 kg Filed Weights   04/17/20 2158 04/18/20 2112 04/19/20 0325  Weight: 79.4 kg 77.5 kg 77.5 kg    Intake/Output: I/O last 3 completed shifts: In: 120 [P.O.:120] Out: 2950 [Urine:2950]   Intake/Output this shift:  Total I/O In: -  Out: 600 [Urine:600]  Physical Exam: General:  No acute distress,calm and  Cooperative, hard of hearing  Head:  Normocephalic, atraumatic. Moist oral mucosal membranes  Eyes:  Anicteric  Neck:  Supple  Lungs:   Normal and symmetric respiratory effort, lungs with crackles at the bases  Heart:  Regular,S1S2 no rubs  Abdomen:   Soft, nontender, nondistended  Extremities:  1+ bilateral lower extremity edema  Neurologic:  Awake, alert, speech clear and appropriate  Skin:  No lesions or rashes  Access:  Permcath placement today    Basic Metabolic Panel: Recent Labs  Lab 04/17/20 2204 04/18/20 0407 04/19/20 0510 04/19/20 0812  NA 142 142 143  --   K 3.4* 3.5 3.4*  --   CL 115* 117* 112*  --   CO2 20* 17* 24  --   GLUCOSE 142* 98 104*  --   BUN 90* 90* 81*  --   CREATININE 3.81* 3.70* 3.22*  --   CALCIUM 9.5 8.8* 9.2  --   MG 1.9  --   --   --   PHOS  --   --   --  2.4*    Liver Function Tests: Recent Labs  Lab 04/17/20 2204  AST 34  ALT 26  ALKPHOS 67  BILITOT 0.8  PROT 7.0  ALBUMIN 3.9   No results for input(s): LIPASE, AMYLASE in the last 168  hours. No results for input(s): AMMONIA in the last 168 hours.  CBC: Recent Labs  Lab 04/17/20 2204 04/19/20 0510  WBC 7.6 5.4  HGB 8.8* 8.6*  HCT 25.6* 24.4*  MCV 94.1 93.8  PLT 99* 70*    Cardiac Enzymes: No results for input(s): CKTOTAL, CKMB, CKMBINDEX, TROPONINI in the last 168 hours.  BNP: Invalid input(s): POCBNP  CBG: No results for input(s): GLUCAP in the last 168 hours.  Microbiology: Results for orders placed or performed during the hospital encounter of 04/17/20  Respiratory Panel by RT PCR (Flu A&B, Covid) - Nasopharyngeal Swab     Status: None   Collection Time: 04/17/20 10:35 PM   Specimen: Nasopharyngeal Swab  Result Value Ref Range Status   SARS Coronavirus 2 by RT PCR NEGATIVE NEGATIVE Final    Comment: (NOTE) SARS-CoV-2 target nucleic acids are NOT DETECTED.  The SARS-CoV-2 RNA is generally detectable in upper respiratoy specimens during the acute phase of infection. The lowest concentration of SARS-CoV-2 viral copies this assay can detect is 131 copies/mL. A negative result does not preclude SARS-Cov-2 infection and should not be used as the sole basis for treatment or other patient management decisions. A negative  result may occur with  improper specimen collection/handling, submission of specimen other than nasopharyngeal swab, presence of viral mutation(s) within the areas targeted by this assay, and inadequate number of viral copies (<131 copies/mL). A negative result must be combined with clinical observations, patient history, and epidemiological information. The expected result is Negative.  Fact Sheet for Patients:  PinkCheek.be  Fact Sheet for Healthcare Providers:  GravelBags.it  This test is no t yet approved or cleared by the [place] FDA and  has been authorized for detection and/or diagnosis of SARS-CoV-2 by FDA under an Emergency Use Authorization (EUA). This EUA  will remain  in effect (meaning this test can be used) for the duration of the COVID-19 declaration under Section 564(b)(1) of the Act, 21 U.S.C. section 360bbb-3(b)(1), unless the authorization is terminated or revoked sooner.     Influenza A by PCR NEGATIVE NEGATIVE Final   Influenza B by PCR NEGATIVE NEGATIVE Final    Comment: (NOTE) The Xpert Xpress SARS-CoV-2/FLU/RSV assay is intended as an aid in  the diagnosis of influenza from Nasopharyngeal swab specimens and  should not be used as a sole basis for treatment. Nasal washings and  aspirates are unacceptable for Xpert Xpress SARS-CoV-2/FLU/RSV  testing.  Fact Sheet for Patients: PinkCheek.be  Fact Sheet for Healthcare Providers: GravelBags.it  This test is not yet approved or cleared by the [place] FDA and  has been authorized for detection and/or diagnosis of SARS-CoV-2 by  FDA under an Emergency Use Authorization (EUA). This EUA will remain  in effect (meaning this test can be used) for the duration of the  Covid-19 declaration under Section 564(b)(1) of the Act, 21  U.S.C. section 360bbb-3(b)(1), unless the authorization is  terminated or revoked. Performed at [facility], [address] [place], [place] [address]   Gastrointestinal Panel by PCR , Stool     Status: None   Collection Time: 04/18/20  9:49 AM   Specimen: Stool  Result Value Ref Range Status   Campylobacter species NOT DETECTED NOT DETECTED Final   Plesimonas shigelloides NOT DETECTED NOT DETECTED Final   Salmonella species NOT DETECTED NOT DETECTED Final   Yersinia enterocolitica NOT DETECTED NOT DETECTED Final   Vibrio species NOT DETECTED NOT DETECTED Final   Vibrio cholerae NOT DETECTED NOT DETECTED Final   Enteroaggregative E coli (EAEC) NOT DETECTED NOT DETECTED Final   Enteropathogenic E coli (EPEC) NOT DETECTED NOT DETECTED Final   Enterotoxigenic E coli (ETEC) NOT  DETECTED NOT DETECTED Final   Shiga like toxin producing E coli (STEC) NOT DETECTED NOT DETECTED Final   Shigella/Enteroinvasive E coli (EIEC) NOT DETECTED NOT DETECTED Final   Cryptosporidium NOT DETECTED NOT DETECTED Final   Cyclospora cayetanensis NOT DETECTED NOT DETECTED Final   Entamoeba histolytica NOT DETECTED NOT DETECTED Final   Giardia lamblia NOT DETECTED NOT DETECTED Final   Adenovirus F40/41 NOT DETECTED NOT DETECTED Final   Astrovirus NOT DETECTED NOT DETECTED Final   Norovirus GI/GII NOT DETECTED NOT DETECTED Final   Rotavirus A NOT DETECTED NOT DETECTED Final   Sapovirus (I, II, IV, and V) NOT DETECTED NOT DETECTED Final    Comment: Performed at [facility], [address] [place], [place] [address]    Coagulation Studies: No results for input(s): LABPROT, INR in the last 72 hours.  Urinalysis: No results for input(s): COLORURINE, LABSPEC, PHURINE, GLUCOSEU, HGBUR, BILIRUBINUR, KETONESUR, PROTEINUR, UROBILINOGEN, NITRITE, LEUKOCYTESUR in the last 72 hours.  Invalid input(s): APPERANCEUR    Imaging:  DG Chest 1 View  Result Date: 04/17/2020 CLINICAL DATA:  Shortness of breath and edema EXAM: CHEST  1 VIEW COMPARISON:  May 25, 2017 FINDINGS: The heart size and mediastinal contours are unchanged with mild cardiomegaly. Aortic knob calcifications are seen. Overlying median sternotomy wires are present. There is prominence of the central pulmonary vasculature. No large airspace consolidation or pleural effusion. No acute osseous abnormality. IMPRESSION: Mild cardiomegaly and pulmonary vascular congestion Electronically Signed   By: Prudencio Pair M.D.   On: 04/17/2020 22:27   ECHOCARDIOGRAM COMPLETE  Result Date: 04/18/2020    ECHOCARDIOGRAM REPORT   Patient Name:   Katherine Hernandez [facility] Date of Exam: 04/18/2020 Medical Rec #:  [id number]      Height:       62.0 in Accession #:    [id number]     Weight:       175.0 lb Date of Birth:  [birth year]      BSA:          1.806  m Patient Age:    61 years       BP:           134/61 mmHg Patient Gender: F              HR:           70 bpm. Exam Location:  [facility] Procedure: 2D Echo Indications:     CHF 428.31  History:         Patient has prior history of Echocardiogram examinations, most                  recent 05/26/2017.  Sonographer:     Arville Go RDCS Referring Phys:  [id number] [place] Diagnosing Phys: Yolonda Kida MD  Sonographer Comments: No subcostal window. IMPRESSIONS  1. Left ventricular ejection fraction, by estimation, is 50 to 55%. The left ventricle has low normal function. The left ventricle has no regional wall motion abnormalities. Left ventricular diastolic parameters were normal.  2. Right ventricular systolic function is normal. The right ventricular size is normal.  3. The mitral valve is myxomatous. No evidence of mitral valve regurgitation. Mild mitral stenosis. Moderate to severe mitral annular calcification.  4. The aortic valve is grossly normal. Aortic valve regurgitation is not visualized. FINDINGS  Left Ventricle: Left ventricular ejection fraction, by estimation, is 50 to 55%. The left ventricle has low normal function. The left ventricle has no regional wall motion abnormalities. The left ventricular internal cavity size was normal in size. There is no left ventricular hypertrophy. Left ventricular diastolic parameters were normal. Right Ventricle: The right ventricular size is normal. No increase in right ventricular wall thickness. Right ventricular systolic function is normal. Left Atrium: Left atrial size was normal in size. Right Atrium: Right atrial size was normal in size. Pericardium: There is no evidence of pericardial effusion. Mitral Valve: The mitral valve is myxomatous. There is moderate calcification of the mitral valve leaflet(s). Mildly decreased mobility of the mitral valve leaflets. Moderate to severe mitral annular calcification. No evidence of mitral valve regurgitation. Mild  mitral valve stenosis. MV peak gradient, 15.1 mmHg. The mean mitral valve gradient is 7.0 mmHg. Tricuspid Valve: The tricuspid valve is normal in structure. Tricuspid valve regurgitation is not demonstrated. Aortic Valve: The aortic valve is grossly normal. Aortic valve regurgitation is not visualized. Aortic valve mean gradient measures 8.0 mmHg. Aortic valve peak gradient measures 16.2 mmHg. Aortic valve area, by VTI measures 1.37 cm. Pulmonic  Valve: The pulmonic valve was normal in structure. Pulmonic valve regurgitation is not visualized. Aorta: The ascending aorta was not well visualized. IAS/Shunts: No atrial level shunt detected by color flow Doppler.  LEFT VENTRICLE PLAX 2D LVIDd:         3.40 cm  Diastology LVIDs:         2.55 cm  LV e' medial:    4.79 cm/s LV PW:         1.61 cm  LV E/e' medial:  31.3 LV IVS:        1.33 cm  LV e' lateral:   7.72 cm/s LVOT diam:     1.90 cm  LV E/e' lateral: 19.4 LV SV:         68 LV SV Index:   38 LVOT Area:     2.84 cm  RIGHT VENTRICLE RV Basal diam:  2.74 cm RV S prime:     13.80 cm/s TAPSE (M-mode): 2.1 cm LEFT ATRIUM           Index       RIGHT ATRIUM           Index LA diam:      3.70 cm 2.05 cm/m  RA Area:     11.00 cm LA Vol (A2C): 36.4 ml 20.15 ml/m RA Volume:   21.90 ml  12.12 ml/m LA Vol (A4C): 50.0 ml 27.68 ml/m  AORTIC VALVE                    PULMONIC VALVE AV Area (Vmax):    1.32 cm     PV Vmax:       1.33 m/s AV Area (Vmean):   1.32 cm     PV Peak grad:  7.1 mmHg AV Area (VTI):     1.37 cm AV Vmax:           201.00 cm/s AV Vmean:          126.000 cm/s AV VTI:            0.496 m AV Peak Grad:      16.2 mmHg AV Mean Grad:      8.0 mmHg LVOT Vmax:         93.30 cm/s LVOT Vmean:        58.700 cm/s LVOT VTI:          0.239 m LVOT/AV VTI ratio: 0.48  AORTA Ao Root diam: 2.70 cm Ao Asc diam:  3.10 cm MITRAL VALVE                TRICUSPID VALVE MV Area (PHT): 3.99 cm     TV Peak grad:   30.0 mmHg MV Peak grad:  15.1 mmHg    TV Vmax:        2.74 m/s MV  Mean grad:  7.0 mmHg MV Vmax:       1.94 m/s     SHUNTS MV Vmean:      127.0 cm/s   Systemic VTI:  0.24 m MV Decel Time: 190 msec     Systemic Diam: 1.90 cm MV E velocity: 150.00 cm/s MV A velocity: 140.00 cm/s MV E/A ratio:  1.07 Dwayne D Callwood MD Electronically signed by Yolonda Kida MD Signature Date/Time: 04/18/2020/11:49:38 AM    Final      Medications:   . sodium chloride    . sodium chloride     . allopurinol  100 mg Oral Daily  . [START  ON 04/20/2020] amLODipine  10 mg Oral Daily  . Chlorhexidine Gluconate Cloth  6 each Topical Q0600  . furosemide  60 mg Intravenous Q12H  . gabapentin  300 mg Oral QHS  . heparin  5,000 Units Subcutaneous Q8H  . insulin glargine  48 Units Subcutaneous Daily  . iron polysaccharides  150 mg Oral Daily  . loratadine  10 mg Oral Daily  . mouth rinse  15 mL Mouth Rinse BID  . metoprolol tartrate  25 mg Oral BID  . pantoprazole  40 mg Oral Daily  . rOPINIRole  0.5 mg Oral QHS  . sertraline  50 mg Oral Daily  . sucralfate  1 g Oral BID  . [START ON 04/23/2020] Vitamin D (Ergocalciferol)  50,000 Units Oral Weekly   sodium chloride, sodium chloride, acetaminophen **OR** acetaminophen, albuterol, alteplase, dicyclomine, heparin, lidocaine (PF), lidocaine-prilocaine, LORazepam, nitroGLYCERIN, ondansetron **OR** ondansetron (ZOFRAN) IV, pentafluoroprop-tetrafluoroeth, traMADol, traZODone, triamcinolone  Assessment/ Plan:  86 y.o. female with past medical history of myelodysplastic syndrome, chronic kidney disease stage V baseline EGFR 14, diabetes mellitus type 2, hypertension, gout, coronary artery disease status post PTCA and CABG, peripheral neuropathy, GERD, hyperlipidemia, diabetic retinopathy, restless leg syndrome, anemia of chronic kidney disease, secondary hyperparathyroidism who presents with weakness, fatigue, and loose stools.  1.  ESRD Patient exhibiting some uremic symptoms.  Reinforced need for dialysis and the plan to place permacath  today. Will dialyze after the PermCath placement Patient and family in agreement Lab Results  Component Value Date   CREATININE 3.22 (H) 04/19/2020   CREATININE 3.70 (H) 04/18/2020   CREATININE 3.81 (H) 04/17/2020    2.  Anemia of CKD hemoglobin 8.6 today We will consider Epogen with dialysis if hemoglobin stays low  3.  Metabolic acidosis.   Bicarbonate 24 today We will continue monitoring  4.  Secondary hyperparathyroidism. Phosphorus 2.4 today Calcium 9.2    LOS: 2 Katherine Hernandez 10/4/20212:49 PM

## 2020-04-19 NOTE — Plan of Care (Signed)
  Problem: Education: Goal: Knowledge of General Education information will improve Description: Including pain rating scale, medication(s)/side effects and non-pharmacologic comfort measures Outcome: Progressing   Problem: Health Behavior/Discharge Planning: Goal: Ability to manage health-related needs will improve Outcome: Progressing   Problem: Clinical Measurements: Goal: Ability to maintain clinical measurements within normal limits will improve Outcome: Progressing Goal: Will remain free from infection Outcome: Progressing Goal: Diagnostic test results will improve Outcome: Progressing Goal: Respiratory complications will improve Outcome: Progressing Goal: Cardiovascular complication will be avoided Outcome: Progressing   Problem: Activity: Goal: Risk for activity intolerance will decrease Outcome: Progressing   Problem: Nutrition: Goal: Adequate nutrition will be maintained Outcome: Progressing   Problem: Coping: Goal: Level of anxiety will decrease Outcome: Progressing   Problem: Elimination: Goal: Will not experience complications related to bowel motility Outcome: Progressing Goal: Will not experience complications related to urinary retention Outcome: Progressing   Problem: Pain Managment: Goal: General experience of comfort will improve Outcome: Progressing   Problem: Safety: Goal: Ability to remain free from injury will improve Outcome: Progressing   Problem: Skin Integrity: Goal: Risk for impaired skin integrity will decrease Outcome: Progressing   Problem: Education: Goal: Knowledge of disease and its progression will improve Outcome: Progressing   Problem: Health Behavior/Discharge Planning: Goal: Ability to manage health-related needs will improve Outcome: Progressing   Problem: Clinical Measurements: Goal: Complications related to the disease process or treatment will be avoided or minimized Outcome: Progressing Goal: Dialysis access  will remain free of complications Outcome: Progressing   Problem: Activity: Goal: Activity intolerance will improve Outcome: Progressing   Problem: Fluid Volume: Goal: Fluid volume balance will be maintained or improved Outcome: Progressing   Problem: Nutritional: Goal: Ability to make appropriate dietary choices will improve Outcome: Progressing   Problem: Respiratory: Goal: Respiratory symptoms related to disease process will be avoided Outcome: Progressing   Problem: Self-Concept: Goal: Body image disturbance will be avoided or minimized Outcome: Progressing   Problem: Urinary Elimination: Goal: Progression of disease will be identified and treated Outcome: Progressing   Problem: Education: Goal: Ability to demonstrate management of disease process will improve Outcome: Progressing Goal: Ability to verbalize understanding of medication therapies will improve Outcome: Progressing Goal: Individualized Educational Video(s) Outcome: Progressing   Problem: Activity: Goal: Capacity to carry out activities will improve Outcome: Progressing   Problem: Cardiac: Goal: Ability to achieve and maintain adequate cardiopulmonary perfusion will improve Outcome: Progressing

## 2020-04-19 NOTE — Progress Notes (Signed)
   04/19/20 2204  Assess: MEWS Score  Temp 98.4 F (36.9 C)  BP (!) 141/44  Pulse Rate 66  Resp 19  SpO2 100 %  O2 Device Room Air  Assess: MEWS Score  MEWS Temp 0  MEWS Systolic 0  MEWS Pulse 0  MEWS RR 0  MEWS LOC 0  MEWS Score 0  MEWS Score Color Green    Pt completed w/ HD.

## 2020-04-19 NOTE — Interval H&P Note (Signed)
History and Physical Interval Note:  71/12/9676 9:38 PM  Katherine Hernandez  has presented today for surgery, with the diagnosis of ESRD.  The various methods of treatment have been discussed with the patient and family. After consideration of risks, benefits and other options for treatment, the patient has consented to  Procedure(s): DIALYSIS/PERMA CATHETER INSERTION (N/A) as a surgical intervention.  The patient's history has been reviewed, patient examined, no change in status, stable for surgery.  I have reviewed the patient's chart and labs.  Questions were answered to the patient's satisfaction.     Leotis Pain

## 2020-04-20 ENCOUNTER — Encounter: Payer: Self-pay | Admitting: Vascular Surgery

## 2020-04-20 DIAGNOSIS — I5031 Acute diastolic (congestive) heart failure: Secondary | ICD-10-CM | POA: Diagnosis not present

## 2020-04-20 DIAGNOSIS — I10 Essential (primary) hypertension: Secondary | ICD-10-CM | POA: Diagnosis not present

## 2020-04-20 DIAGNOSIS — N186 End stage renal disease: Secondary | ICD-10-CM | POA: Diagnosis not present

## 2020-04-20 DIAGNOSIS — E876 Hypokalemia: Secondary | ICD-10-CM | POA: Diagnosis not present

## 2020-04-20 LAB — GLUCOSE, CAPILLARY: Glucose-Capillary: 100 mg/dL — ABNORMAL HIGH (ref 70–99)

## 2020-04-20 LAB — PARATHYROID HORMONE, INTACT (NO CA): PTH: 58 pg/mL (ref 15–65)

## 2020-04-20 LAB — QUANTIFERON-TB GOLD PLUS (RQFGPL)
QuantiFERON Mitogen Value: >10 IU/mL
QuantiFERON Nil Value: 0.02 IU/mL
QuantiFERON TB1 Ag Value: 0.08 IU/mL
QuantiFERON TB2 Ag Value: 0.07 IU/mL

## 2020-04-20 LAB — QUANTIFERON-TB GOLD PLUS: QuantiFERON-TB Gold Plus: NEGATIVE

## 2020-04-20 LAB — CBC
HCT: 22.1 % — ABNORMAL LOW (ref 36.0–46.0)
Hemoglobin: 7.5 g/dL — ABNORMAL LOW (ref 12.0–15.0)
MCH: 32.2 pg (ref 26.0–34.0)
MCHC: 33.9 g/dL (ref 30.0–36.0)
MCV: 94.8 fL (ref 80.0–100.0)
Platelets: 65 10*3/uL — ABNORMAL LOW (ref 150–400)
RBC: 2.33 MIL/uL — ABNORMAL LOW (ref 3.87–5.11)
RDW: 16.1 % — ABNORMAL HIGH (ref 11.5–15.5)
WBC: 5.1 10*3/uL (ref 4.0–10.5)
nRBC: 0 % (ref 0.0–0.2)

## 2020-04-20 LAB — BASIC METABOLIC PANEL
Anion gap: 9 (ref 5–15)
BUN: 56 mg/dL — ABNORMAL HIGH (ref 8–23)
CO2: 25 mmol/L (ref 22–32)
Calcium: 8.7 mg/dL — ABNORMAL LOW (ref 8.9–10.3)
Chloride: 107 mmol/L (ref 98–111)
Creatinine, Ser: 2.46 mg/dL — ABNORMAL HIGH (ref 0.44–1.00)
GFR calc Af Amer: 20 mL/min — ABNORMAL LOW (ref >60)
GFR calc non Af Amer: 17 mL/min — ABNORMAL LOW (ref >60)
Glucose, Bld: 87 mg/dL (ref 70–99)
Potassium: 2.6 mmol/L — CL (ref 3.5–5.1)
Sodium: 141 mmol/L (ref 135–145)

## 2020-04-20 LAB — PHOSPHORUS: Phosphorus: 1.9 mg/dL — ABNORMAL LOW (ref 2.5–4.6)

## 2020-04-20 MED ORDER — POTASSIUM CHLORIDE CRYS ER 20 MEQ PO TBCR
40.0000 meq | EXTENDED_RELEASE_TABLET | Freq: Once | ORAL | Status: AC
  Administered 2020-04-20: 40 meq via ORAL
  Filled 2020-04-20: qty 2

## 2020-04-20 MED ORDER — EPOETIN ALFA 10000 UNIT/ML IJ SOLN
10000.0000 [IU] | INTRAMUSCULAR | Status: DC
  Administered 2020-04-21: 10000 [IU] via INTRAVENOUS

## 2020-04-20 MED ORDER — POTASSIUM CHLORIDE 10 MEQ/100ML IV SOLN
10.0000 meq | INTRAVENOUS | Status: AC
  Administered 2020-04-20 (×2): 10 meq via INTRAVENOUS
  Filled 2020-04-20 (×3): qty 100

## 2020-04-20 MED ORDER — K PHOS MONO-SOD PHOS DI & MONO 155-852-130 MG PO TABS
500.0000 mg | ORAL_TABLET | ORAL | Status: DC
  Filled 2020-04-20: qty 2

## 2020-04-20 NOTE — Progress Notes (Signed)
1       PROGRESS NOTE    Katherine Hernandez  VVZ:[id number] DOB: [birth year] DOA: 04/17/2020 PCP: Tracie Harrier, MD   Brief Narrative:   Katherine Hernandez  is a 86 y.o. Caucasian femalewith a known history of stage V chronic kidney disease, diastolic CHF, coronary artery disease, type 2 diabetes mellitus, hypertension dyslipidemia admitted for acute onset of worsening dyspnea as well as orthopnea and paroxysmal nocturnal dyspnea. She has been having worsening lower extremity edema as well as dry cough and wheezing. She admitted to diarrhea which has been intermittent over the last several days, occasionally watery and loose. She would notice minimal amount of blood when she wipes. No bright red bleeding per rectum or melanotic stools.   While in the emergency room, blood pressure was 81/63 with respiratory rate of 24 and otherwise normal vital signs. Blood pressure later on was up to 171/60 then 168/61. CMP remarkable for mild hypokalemia of 3.4 BUN of 90 and creatinine 3.81 compared to 74 and 3.61 on 03/17/2020. CBC showed anemia close to her baseline with hemoglobin 8.8 and hematocrit 25.6. Chest x-ray showed mild cardiomegaly with pulmonary vascular congestion.  The patient was given 80 mg IV Lasix and 40 mEq p.o. potassium chloride.   Assessment & Plan:   Active Problems:   Acute CHF (congestive heart failure) ([place])  1. Acute on chronic diastolic CHF. -Continue IV Lasix 60 mg twice daily. This is likely due to ESRD -Cardiology Dr. Clayborn Bigness following  2. ESRD on hemodialysis -Status post permacath on 10/4.  Patient received first dialysis session yesterday and getting second today  3. Hypokalemia. -Replete and recheck  4.  Anemia of chronic kidney disease.  She has associated diarrhea. -Stable H&H.  No endovascular evaluation per GI -hematochezia likely due to hemorrhoids  5. Essential hypertension. -continue Lopressor and Norvasc  6. Gout. -continue  allopurinol.  7. Dyslipidemia. -continue TriCor.  8. Restless leg syndrome. - continue Requip.  9. GERD. -continue PPI therapy.  10. Type 2 diabetes mellitus. -Sliding scale insulin  11.  Secondary hyperparathyroidism: Due to underlying CKD  12.  Obesity Body mass index is 31.4 kg/m.      DVT prophylaxis: heparin injection 5,000 Units Start: 04/17/20 2330     Code Status: Full Code Family Communication: Discussed with patient, her daughter was updated on 10/4 Disposition Plan: In next 2-3 days depending on nephrology evaluation and dialysis need. Status is: Inpatient  Remains inpatient appropriate because:Inpatient level of care appropriate due to severity of illness   Dispo: The patient is from: Home              Anticipated d/c is to: Home              Anticipated d/c date is: > 3 days              Patient currently is not medically stable to d/c.  Consultants:   Nephrology  Vascular surgery  GI  Cardiology   Subjective: Feeling nauseous.  Feels tired.  Did not sleep very well last night  Objective: Vitals:   04/20/20 1200 04/20/20 1315 04/20/20 1357 04/20/20 1403  BP: (!) 154/64   (!) 139/55  Pulse:    72  Resp:      Temp:  98.5 F (36.9 C)  98.8 F (37.1 C)  TempSrc:    Oral  SpO2:    99%  Weight:   72.9 kg   Height:  Intake/Output Summary (Last 24 hours) at 04/20/2020 1404 Last data filed at 04/20/2020 1315 Gross per 24 hour  Intake 480 ml  Output 1350 ml  Net -870 ml   Filed Weights   04/19/20 0325 04/20/20 0526 04/20/20 1357  Weight: 77.5 kg 74.7 kg 72.9 kg    Examination:  General exam: Appears calm and comfortable  Respiratory system: Clear to auscultation. Respiratory effort normal. Cardiovascular system: S1 & S2 heard, RRR. No JVD, murmurs, rubs, gallops or clicks. No pedal edema. Gastrointestinal system: Abdomen is nondistended, soft and nontender. No organomegaly or masses felt. Normal bowel sounds  heard. Central nervous system: Alert and oriented. No focal neurological deficits. Extremities: Symmetric 5 x 5 power. Skin: No rashes, lesions or ulcers Psychiatry: Judgement and insight appear normal. Mood & affect appropriate.  Dialysis access: Right IJ permacath   Data Reviewed: I have personally reviewed following labs and imaging studies  CBC: Recent Labs  Lab 04/17/20 2204 04/19/20 0510 04/20/20 0418  WBC 7.6 5.4 5.1  HGB 8.8* 8.6* 7.5*  HCT 25.6* 24.4* 22.1*  MCV 94.1 93.8 94.8  PLT 99* 70* 65*   Basic Metabolic Panel: Recent Labs  Lab 04/17/20 2204 04/18/20 0407 04/19/20 0510 04/19/20 0812 04/20/20 0418 04/20/20 1050  NA 142 142 143  --  141  --   K 3.4* 3.5 3.4*  --  2.6*  --   CL 115* 117* 112*  --  107  --   CO2 20* 17* 24  --  25  --   GLUCOSE 142* 98 104*  --  87  --   BUN 90* 90* 81*  --  56*  --   CREATININE 3.81* 3.70* 3.22*  --  2.46*  --   CALCIUM 9.5 8.8* 9.2  --  8.7*  --   MG 1.9  --   --   --   --   --   PHOS  --   --   --  2.4*  --  1.9*   GFR: Estimated Creatinine Clearance: 14.4 mL/min (A) (by C-G formula based on SCr of 2.46 mg/dL (H)). Liver Function Tests: Recent Labs  Lab 04/17/20 2204  AST 34  ALT 26  ALKPHOS 67  BILITOT 0.8  PROT 7.0  ALBUMIN 3.9   No results for input(s): LIPASE, AMYLASE in the last 168 hours. No results for input(s): AMMONIA in the last 168 hours. Coagulation Profile: No results for input(s): INR, PROTIME in the last 168 hours. Cardiac Enzymes: No results for input(s): CKTOTAL, CKMB, CKMBINDEX, TROPONINI in the last 168 hours. BNP (last 3 results) No results for input(s): PROBNP in the last 8760 hours. HbA1C: No results for input(s): HGBA1C in the last 72 hours. CBG: Recent Labs  Lab 04/19/20 1539 04/19/20 1703 04/20/20 0752  GLUCAP 94 102* 100*   Lipid Profile: No results for input(s): CHOL, HDL, LDLCALC, TRIG, CHOLHDL, LDLDIRECT in the last 72 hours. Thyroid Function Tests: No results for  input(s): TSH, T4TOTAL, FREET4, T3FREE, THYROIDAB in the last 72 hours. Anemia Panel: No results for input(s): VITAMINB12, FOLATE, FERRITIN, TIBC, IRON, RETICCTPCT in the last 72 hours. Sepsis Labs: Recent Labs  Lab 04/17/20 2204 04/18/20 0018  PROCALCITON <0.10  --   LATICACIDVEN 1.4 1.4    Recent Results (from the past 240 hour(s))  Respiratory Panel by RT PCR (Flu A&B, Covid) - Nasopharyngeal Swab     Status: None   Collection Time: 04/17/20 10:35 PM   Specimen: Nasopharyngeal Swab  Result Value Ref  Range Status   SARS Coronavirus 2 by RT PCR NEGATIVE NEGATIVE Final    Comment: (NOTE) SARS-CoV-2 target nucleic acids are NOT DETECTED.  The SARS-CoV-2 RNA is generally detectable in upper respiratoy specimens during the acute phase of infection. The lowest concentration of SARS-CoV-2 viral copies this assay can detect is 131 copies/mL. A negative result does not preclude SARS-Cov-2 infection and should not be used as the sole basis for treatment or other patient management decisions. A negative result may occur with  improper specimen collection/handling, submission of specimen other than nasopharyngeal swab, presence of viral mutation(s) within the areas targeted by this assay, and inadequate number of viral copies (<131 copies/mL). A negative result must be combined with clinical observations, patient history, and epidemiological information. The expected result is Negative.  Fact Sheet for Patients:  PinkCheek.be  Fact Sheet for Healthcare Providers:  GravelBags.it  This test is no t yet approved or cleared by the [place] FDA and  has been authorized for detection and/or diagnosis of SARS-CoV-2 by FDA under an Emergency Use Authorization (EUA). This EUA will remain  in effect (meaning this test can be used) for the duration of the COVID-19 declaration under Section 564(b)(1) of the Act, 21 U.S.C. section  360bbb-3(b)(1), unless the authorization is terminated or revoked sooner.     Influenza A by PCR NEGATIVE NEGATIVE Final   Influenza B by PCR NEGATIVE NEGATIVE Final    Comment: (NOTE) The Xpert Xpress SARS-CoV-2/FLU/RSV assay is intended as an aid in  the diagnosis of influenza from Nasopharyngeal swab specimens and  should not be used as a sole basis for treatment. Nasal washings and  aspirates are unacceptable for Xpert Xpress SARS-CoV-2/FLU/RSV  testing.  Fact Sheet for Patients: PinkCheek.be  Fact Sheet for Healthcare Providers: GravelBags.it  This test is not yet approved or cleared by the [place] FDA and  has been authorized for detection and/or diagnosis of SARS-CoV-2 by  FDA under an Emergency Use Authorization (EUA). This EUA will remain  in effect (meaning this test can be used) for the duration of the  Covid-19 declaration under Section 564(b)(1) of the Act, 21  U.S.C. section 360bbb-3(b)(1), unless the authorization is  terminated or revoked. Performed at [facility], [address] [place], [place] [address]   Gastrointestinal Panel by PCR , Stool     Status: None   Collection Time: 04/18/20  9:49 AM   Specimen: Stool  Result Value Ref Range Status   Campylobacter species NOT DETECTED NOT DETECTED Final   Plesimonas shigelloides NOT DETECTED NOT DETECTED Final   Salmonella species NOT DETECTED NOT DETECTED Final   Yersinia enterocolitica NOT DETECTED NOT DETECTED Final   Vibrio species NOT DETECTED NOT DETECTED Final   Vibrio cholerae NOT DETECTED NOT DETECTED Final   Enteroaggregative E coli (EAEC) NOT DETECTED NOT DETECTED Final   Enteropathogenic E coli (EPEC) NOT DETECTED NOT DETECTED Final   Enterotoxigenic E coli (ETEC) NOT DETECTED NOT DETECTED Final   Shiga like toxin producing E coli (STEC) NOT DETECTED NOT DETECTED Final   Shigella/Enteroinvasive E coli (EIEC) NOT DETECTED NOT  DETECTED Final   Cryptosporidium NOT DETECTED NOT DETECTED Final   Cyclospora cayetanensis NOT DETECTED NOT DETECTED Final   Entamoeba histolytica NOT DETECTED NOT DETECTED Final   Giardia lamblia NOT DETECTED NOT DETECTED Final   Adenovirus F40/41 NOT DETECTED NOT DETECTED Final   Astrovirus NOT DETECTED NOT DETECTED Final   Norovirus GI/GII NOT DETECTED NOT DETECTED Final  Rotavirus A NOT DETECTED NOT DETECTED Final   Sapovirus (I, II, IV, and V) NOT DETECTED NOT DETECTED Final    Comment: Performed at [facility], [address], [place], [place] [address]         Radiology Studies: PERIPHERAL VASCULAR CATHETERIZATION  Result Date: 04/19/2020 See op note       Scheduled Meds: . allopurinol  100 mg Oral Daily  . amLODipine  10 mg Oral Daily  . Chlorhexidine Gluconate Cloth  6 each Topical Q0600  . furosemide  60 mg Intravenous Q12H  . gabapentin  300 mg Oral QHS  . heparin  5,000 Units Subcutaneous Q8H  . insulin glargine  48 Units Subcutaneous Daily  . iron polysaccharides  150 mg Oral Daily  . loratadine  10 mg Oral Daily  . mouth rinse  15 mL Mouth Rinse BID  . metoprolol tartrate  25 mg Oral BID  . pantoprazole  40 mg Oral Daily  . rOPINIRole  0.5 mg Oral QHS  . sertraline  50 mg Oral Daily  . sucralfate  1 g Oral BID  . [START ON 04/23/2020] Vitamin D (Ergocalciferol)  50,000 Units Oral Weekly   Continuous Infusions: . sodium chloride    . sodium chloride       LOS: 3 days    Time spent: 35 minutes    Max Sane, MD [facility] Pager [phone]  If 7PM-7AM, please contact night-coverage [email or web address] Password [email or web address] 04/20/2020, 2:04 PM

## 2020-04-20 NOTE — Care Management Important Message (Signed)
Important Message  Patient Details  Name: Katherine Hernandez MRN: [id number] Date of Birth: [birth year]   Medicare Important Message Given:  Yes     Dannette Barbara 04/20/2020, 11:04 AM

## 2020-04-20 NOTE — Progress Notes (Signed)
Mobility Specialist - Progress Note   04/20/20 1418  Mobility  Activity Contraindicated/medical hold  Mobility performed by Mobility specialist    Session on hold d/t pt's K+ levels trending at 2.6 sitting outside safety guidelines for mobility. Will monitor and attempt session when appropriate.    Kathee Delton Mobility Specialist 04/20/20, 2:20 PM

## 2020-04-20 NOTE — Progress Notes (Signed)
NP Rufina Falco notified of critical result: Potassium 2.6. NP Ouma placed ordered for potassium replacement. Medications given as per NP orders. No further actions at this time, will continue to monitor.

## 2020-04-20 NOTE — Progress Notes (Signed)
[facility]  ROUNDING NOTE   Subjective:  Patient received her first dialysis treatment yesterday, tolerated well.  Planning for dialysis treatment today again.   Objective:  Vital signs in last 24 hours:  Temp:  [96.6 F (35.9 C)-98.8 F (37.1 C)] 97.9 F (36.6 C) (10/05 0750) Pulse Rate:  [52-105] 67 (10/05 1100) Resp:  [12-22] 20 (10/05 1100) BP: (99-169)/(38-88) 154/64 (10/05 1200) SpO2:  [97 %-100 %] 100 % (10/05 1100) Weight:  [74.7 kg] 74.7 kg (10/05 0526)  Weight change: -2.812 kg Filed Weights   04/18/20 2112 04/19/20 0325 04/20/20 0526  Weight: 77.5 kg 77.5 kg 74.7 kg    Intake/Output: I/O last 3 completed shifts: In: 360 [P.O.:360] Out: 3050 [Urine:3250]   Intake/Output this shift:  Total I/O In: 240 [P.O.:240] Out: 300 [Urine:300]  Physical Exam: General:  Pleasant, in no acute distress, family at bedside  Head:   Moist oral mucosal membranes  Eyes:  Anicteric  Neck:  Supple  Lungs:   Lungs clear bilaterally, normal symmetric respiratory effort  Heart:  S1,S2, no rubs or gallops  Abdomen:   Soft, nontender, nondistended  Extremities:  No peripheral edema  Neurologic:  Oriented  Skin:  No lesions or rashes  Access:  Right IJ PermCath    Basic Metabolic Panel: Recent Labs  Lab 04/17/20 2204 04/17/20 2204 04/18/20 0407 04/19/20 0510 04/19/20 0812 04/20/20 0418 04/20/20 1050  NA 142  --  142 143  --  141  --   K 3.4*  --  3.5 3.4*  --  2.6*  --   CL 115*  --  117* 112*  --  107  --   CO2 20*  --  17* 24  --  25  --   GLUCOSE 142*  --  98 104*  --  87  --   BUN 90*  --  90* 81*  --  56*  --   CREATININE 3.81*  --  3.70* 3.22*  --  2.46*  --   CALCIUM 9.5   < > 8.8* 9.2  --  8.7*  --   MG 1.9  --   --   --   --   --   --   PHOS  --   --   --   --  2.4*  --  1.9*   < > = values in this interval not displayed.    Liver Function Tests: Recent Labs  Lab 04/17/20 2204  AST 34  ALT 26  ALKPHOS 67  BILITOT 0.8  PROT 7.0   ALBUMIN 3.9   No results for input(s): LIPASE, AMYLASE in the last 168 hours. No results for input(s): AMMONIA in the last 168 hours.  CBC: Recent Labs  Lab 04/17/20 2204 04/19/20 0510 04/20/20 0418  WBC 7.6 5.4 5.1  HGB 8.8* 8.6* 7.5*  HCT 25.6* 24.4* 22.1*  MCV 94.1 93.8 94.8  PLT 99* 70* 65*    Cardiac Enzymes: No results for input(s): CKTOTAL, CKMB, CKMBINDEX, TROPONINI in the last 168 hours.  BNP: Invalid input(s): POCBNP  CBG: Recent Labs  Lab 04/19/20 1539 04/19/20 1703 04/20/20 0752  GLUCAP 94 102* 100*    Microbiology: Results for orders placed or performed during the hospital encounter of 04/17/20  Respiratory Panel by RT PCR (Flu A&B, Covid) - Nasopharyngeal Swab     Status: None   Collection Time: 04/17/20 10:35 PM   Specimen: Nasopharyngeal Swab  Result Value Ref Range Status   SARS Coronavirus  2 by RT PCR NEGATIVE NEGATIVE Final    Comment: (NOTE) SARS-CoV-2 target nucleic acids are NOT DETECTED.  The SARS-CoV-2 RNA is generally detectable in upper respiratoy specimens during the acute phase of infection. The lowest concentration of SARS-CoV-2 viral copies this assay can detect is 131 copies/mL. A negative result does not preclude SARS-Cov-2 infection and should not be used as the sole basis for treatment or other patient management decisions. A negative result may occur with  improper specimen collection/handling, submission of specimen other than nasopharyngeal swab, presence of viral mutation(s) within the areas targeted by this assay, and inadequate number of viral copies (<131 copies/mL). A negative result must be combined with clinical observations, patient history, and epidemiological information. The expected result is Negative.  Fact Sheet for Patients:  PinkCheek.be  Fact Sheet for Healthcare Providers:  GravelBags.it  This test is no t yet approved or cleared by the [place] FDA and  has been authorized for detection and/or diagnosis of SARS-CoV-2 by FDA under an Emergency Use Authorization (EUA). This EUA will remain  in effect (meaning this test can be used) for the duration of the COVID-19 declaration under Section 564(b)(1) of the Act, 21 U.S.C. section 360bbb-3(b)(1), unless the authorization is terminated or revoked sooner.     Influenza A by PCR NEGATIVE NEGATIVE Final   Influenza B by PCR NEGATIVE NEGATIVE Final    Comment: (NOTE) The Xpert Xpress SARS-CoV-2/FLU/RSV assay is intended as an aid in  the diagnosis of influenza from Nasopharyngeal swab specimens and  should not be used as a sole basis for treatment. Nasal washings and  aspirates are unacceptable for Xpert Xpress SARS-CoV-2/FLU/RSV  testing.  Fact Sheet for Patients: PinkCheek.be  Fact Sheet for Healthcare Providers: GravelBags.it  This test is not yet approved or cleared by the [place] FDA and  has been authorized for detection and/or diagnosis of SARS-CoV-2 by  FDA under an Emergency Use Authorization (EUA). This EUA will remain  in effect (meaning this test can be used) for the duration of the  Covid-19 declaration under Section 564(b)(1) of the Act, 21  U.S.C. section 360bbb-3(b)(1), unless the authorization is  terminated or revoked. Performed at [facility], [address] [place], [place] [address]   Gastrointestinal Panel by PCR , Stool     Status: None   Collection Time: 04/18/20  9:49 AM   Specimen: Stool  Result Value Ref Range Status   Campylobacter species NOT DETECTED NOT DETECTED Final   Plesimonas shigelloides NOT DETECTED NOT DETECTED Final   Salmonella species NOT DETECTED NOT DETECTED Final   Yersinia enterocolitica NOT DETECTED NOT DETECTED Final   Vibrio species NOT DETECTED NOT DETECTED Final   Vibrio cholerae NOT DETECTED NOT DETECTED Final   Enteroaggregative E coli  (EAEC) NOT DETECTED NOT DETECTED Final   Enteropathogenic E coli (EPEC) NOT DETECTED NOT DETECTED Final   Enterotoxigenic E coli (ETEC) NOT DETECTED NOT DETECTED Final   Shiga like toxin producing E coli (STEC) NOT DETECTED NOT DETECTED Final   Shigella/Enteroinvasive E coli (EIEC) NOT DETECTED NOT DETECTED Final   Cryptosporidium NOT DETECTED NOT DETECTED Final   Cyclospora cayetanensis NOT DETECTED NOT DETECTED Final   Entamoeba histolytica NOT DETECTED NOT DETECTED Final   Giardia lamblia NOT DETECTED NOT DETECTED Final   Adenovirus F40/41 NOT DETECTED NOT DETECTED Final   Astrovirus NOT DETECTED NOT DETECTED Final   Norovirus GI/GII NOT DETECTED NOT DETECTED Final   Rotavirus A NOT DETECTED NOT DETECTED  Final   Sapovirus (I, II, IV, and V) NOT DETECTED NOT DETECTED Final    Comment: Performed at [facility], [address] [place], [place] [address]    Coagulation Studies: No results for input(s): LABPROT, INR in the last 72 hours.  Urinalysis: No results for input(s): COLORURINE, LABSPEC, PHURINE, GLUCOSEU, HGBUR, BILIRUBINUR, KETONESUR, PROTEINUR, UROBILINOGEN, NITRITE, LEUKOCYTESUR in the last 72 hours.  Invalid input(s): APPERANCEUR    Imaging: PERIPHERAL VASCULAR CATHETERIZATION  Result Date: 04/19/2020 See op note    Medications:   . sodium chloride    . sodium chloride     . allopurinol  100 mg Oral Daily  . amLODipine  10 mg Oral Daily  . Chlorhexidine Gluconate Cloth  6 each Topical Q0600  . furosemide  60 mg Intravenous Q12H  . gabapentin  300 mg Oral QHS  . heparin  5,000 Units Subcutaneous Q8H  . insulin glargine  48 Units Subcutaneous Daily  . iron polysaccharides  150 mg Oral Daily  . loratadine  10 mg Oral Daily  . mouth rinse  15 mL Mouth Rinse BID  . metoprolol tartrate  25 mg Oral BID  . pantoprazole  40 mg Oral Daily  . rOPINIRole  0.5 mg Oral QHS  . sertraline  50 mg Oral Daily  . sucralfate  1 g Oral BID  . [START ON 04/23/2020]  Vitamin D (Ergocalciferol)  50,000 Units Oral Weekly   sodium chloride, sodium chloride, acetaminophen **OR** acetaminophen, albuterol, alteplase, dicyclomine, heparin, lidocaine (PF), lidocaine-prilocaine, LORazepam, nitroGLYCERIN, ondansetron **OR** ondansetron (ZOFRAN) IV, pentafluoroprop-tetrafluoroeth, traMADol, traZODone, triamcinolone  Assessment/ Plan:  86 y.o. female with past medical history of myelodysplastic syndrome, chronic kidney disease stage V baseline EGFR 14, diabetes mellitus type 2, hypertension, gout, coronary artery disease status post PTCA and CABG, peripheral neuropathy, GERD, hyperlipidemia, diabetic retinopathy, restless leg syndrome, anemia of chronic kidney disease, secondary hyperparathyroidism who presents with weakness, fatigue, and loose stools.  1.  ESRD Patient received dialysis for 2 hours yesterday Plan dialyzing again today   Lab Results  Component Value Date   CREATININE 2.46 (H) 04/20/2020   CREATININE 3.22 (H) 04/19/2020   CREATININE 3.70 (H) 04/18/2020    2.  Anemia of CKD hemoglobin 7.5 today Will start Epogen with dialysis Continue monitoring CBCs  3.  Metabolic acidosis.   Bicarbonate 25 today We will continue monitoring  4.  Secondary hyperparathyroidism. Phosphorus 1.9 Calcium 8.7 Will continue follow up with labs  5.Hypokalemia K+2.6 today Received supplemental Potassium this morning Will use 4K+bath for dialysis    LOS: 3 Brendin Situ 10/5/202112:10 PM

## 2020-04-20 NOTE — TOC Initial Note (Signed)
Transition of Care [facility]) - Initial/Assessment Note    Patient Details  Name: Katherine Hernandez MRN: [id number] Date of Birth: [birth year]  Transition of Care [facility]) CM/SW Contact:    Victorino Dike, RN Phone Number: 04/20/2020, 2:58 PM  Clinical Narrative:                  Patient lives at [facility] living.   She will be moving to the ALF after this admission, but will need to go to their SNF on discharge from facility.  Patient is also new HD patient.  Facility reports patient has to be M, W, F for HD to have transportation available.  Contacted HD Coordinator Estill Bamberg to giver her information and to share residence SW information with her.   Expected Discharge Plan: [place] Barriers to Discharge: Continued Medical Work up   Patient Goals and CMS Choice     Choice offered to / list presented to : Patient, Adult Children (Daughter Mariann Laster)  Expected Discharge Plan and Services Expected Discharge Plan: [place] In-house Referral: Clinical Social Work Discharge Planning Services: CM Consult Post Acute Care Choice: Dialysis Living arrangements for the past 2 months: [place]                                      Prior Living Arrangements/Services Living arrangements for the past 2 months: [place] Lives with:: Self Patient language and need for interpreter reviewed:: Yes Do you feel safe going back to the place where you live?: Yes      Need for Family Participation in Patient Care: Yes (Comment) Care giver support system in place?: Yes (comment) Current home services: Other (comment) ([place] that has access resources if needed.) Criminal Activity/Legal Involvement Pertinent to Current Situation/Hospitalization: No - Comment as needed  Activities of Daily Living Home Assistive Devices/Equipment: Environmental consultant (specify type) (Rollator) ADL Screening (condition at time  of admission) Patient's cognitive ability adequate to safely complete daily activities?: Yes Is the patient deaf or have difficulty hearing?: Yes Does the patient have difficulty seeing, even when wearing glasses/contacts?: No Does the patient have difficulty concentrating, remembering, or making decisions?: No Patient able to express need for assistance with ADLs?: Yes Does the patient have difficulty dressing or bathing?: No Independently performs ADLs?: Yes (appropriate for developmental age) Does the patient have difficulty walking or climbing stairs?: Yes Weakness of Legs: Both Weakness of Arms/Hands: Both  Permission Sought/Granted                  Emotional Assessment       Orientation: : Oriented to Self, Oriented to Place, Oriented to  Time, Oriented to Situation Alcohol / Substance Use: Not Applicable Psych Involvement: No (comment)  Admission diagnosis:  Hypokalemia [E87.6] Thrombocytopenia (HCC) [D69.6] Acute CHF (congestive heart failure) ([place]) [I50.9] Low hemoglobin [D64.9] Chronic kidney disease, unspecified CKD stage [N18.9] Acute on chronic congestive heart failure, unspecified heart failure type ([place]) [I50.9] Patient Active Problem List   Diagnosis Date Noted  . Acute CHF (congestive heart failure) ([place]) 04/17/2020  . CKD (chronic kidney disease) 03/17/2020  . MDS (myelodysplastic syndrome) ([place]) 12/03/2017  . Normocytic anemia 12/03/2017  . Thrombocytopenia ([place]) 12/03/2017  . HTN (hypertension) 06/07/2017  . Atrial fibrillation ([place]) 06/07/2017  . Diabetes ([place]) 06/07/2017  . Lymphedema 06/07/2017  . CHF (congestive heart failure) ([place]) 05/25/2017  PCP:  Tracie Harrier, MD Pharmacy:   [facility], [address], [address] [place], [place] [address] Phone: [phone] Fax: [phone]  [place], [place] [facility] [address] [place]  [address] Phone: [phone] Fax: [phone]     Social Determinants of Health (SDOH) Interventions    Readmission Risk Interventions Readmission Risk Prevention Plan 04/19/2020  Transportation Screening Complete  PCP or Specialist Appt within 3-5 Days Complete  HRI or [place] Complete  Social Work Consult for [place] Planning/Counseling Complete  Palliative Care Screening Complete  Medication Review Press photographer) Complete  Some recent data might be hidden

## 2020-04-21 DIAGNOSIS — I509 Heart failure, unspecified: Secondary | ICD-10-CM | POA: Diagnosis not present

## 2020-04-21 DIAGNOSIS — N186 End stage renal disease: Secondary | ICD-10-CM

## 2020-04-21 DIAGNOSIS — D696 Thrombocytopenia, unspecified: Secondary | ICD-10-CM

## 2020-04-21 LAB — HEPATITIS PANEL, ACUTE
HCV Ab: NONREACTIVE
Hep A IgM: UNDETERMINED — AB
Hep B C IgM: NONREACTIVE
Hepatitis B Surface Ag: NONREACTIVE

## 2020-04-21 LAB — BASIC METABOLIC PANEL
Anion gap: 8 (ref 5–15)
BUN: 38 mg/dL — ABNORMAL HIGH (ref 8–23)
CO2: 27 mmol/L (ref 22–32)
Calcium: 8.9 mg/dL (ref 8.9–10.3)
Chloride: 104 mmol/L (ref 98–111)
Creatinine, Ser: 2.31 mg/dL — ABNORMAL HIGH (ref 0.44–1.00)
GFR calc non Af Amer: 18 mL/min — ABNORMAL LOW (ref >60)
Glucose, Bld: 128 mg/dL — ABNORMAL HIGH (ref 70–99)
Potassium: 3.4 mmol/L — ABNORMAL LOW (ref 3.5–5.1)
Sodium: 139 mmol/L (ref 135–145)

## 2020-04-21 LAB — CBC
HCT: 23 % — ABNORMAL LOW (ref 36.0–46.0)
Hemoglobin: 7.7 g/dL — ABNORMAL LOW (ref 12.0–15.0)
MCH: 32.2 pg (ref 26.0–34.0)
MCHC: 33.5 g/dL (ref 30.0–36.0)
MCV: 96.2 fL (ref 80.0–100.0)
Platelets: 61 10*3/uL — ABNORMAL LOW (ref 150–400)
RBC: 2.39 MIL/uL — ABNORMAL LOW (ref 3.87–5.11)
RDW: 16.4 % — ABNORMAL HIGH (ref 11.5–15.5)
WBC: 5.9 10*3/uL (ref 4.0–10.5)
nRBC: 0 % (ref 0.0–0.2)

## 2020-04-21 LAB — PHOSPHORUS
Phosphorus: 1.5 mg/dL — ABNORMAL LOW (ref 2.5–4.6)
Phosphorus: 1.7 mg/dL — ABNORMAL LOW (ref 2.5–4.6)

## 2020-04-21 LAB — GLUCOSE, CAPILLARY: Glucose-Capillary: 105 mg/dL — ABNORMAL HIGH (ref 70–99)

## 2020-04-21 MED ORDER — FENOFIBRATE 54 MG PO TABS
54.0000 mg | ORAL_TABLET | Freq: Every day | ORAL | Status: DC
  Administered 2020-04-21 – 2020-04-22 (×2): 54 mg via ORAL
  Filled 2020-04-21 (×2): qty 1

## 2020-04-21 NOTE — Progress Notes (Signed)
[facility]  ROUNDING NOTE   Subjective:  Patient set up for her third dialysis session today, tolerated previous dialysis treatments well.  She appears in no acute distress.  Daughter at bedside, involved in care, questions regarding dialysis answered.   Objective:  Vital signs in last 24 hours:  Temp:  [98.4 F (36.9 C)-99.4 F (37.4 C)] 99 F (37.2 C) (10/06 1422) Pulse Rate:  [68-83] 82 (10/06 1422) Resp:  [14-26] 18 (10/06 1422) BP: (147-184)/(46-119) 168/69 (10/06 1422) SpO2:  [98 %-100 %] 99 % (10/06 1422) Weight:  [74.3 kg] 74.3 kg (10/06 0422)  Weight change: -1.724 kg Filed Weights   04/20/20 0526 04/20/20 1357 04/21/20 0422  Weight: 74.7 kg 72.9 kg 74.3 kg    Intake/Output: I/O last 3 completed shifts: In: 720 [P.O.:720] Out: 950 [Urine:1350]   Intake/Output this shift:  Total I/O In: 240 [P.O.:240] Out: -   Physical Exam: General:  Sitting up in bed, in no acute distress  Head:  Normocephalic, atraumatic oral mucosal membranes moist  Eyes:  Anicteric  Neck:  Supple  Lungs:   Respirations even and unlabored, lungs clear  Heart:  Regular   Abdomen:   Soft, nontender, nondistended  Extremities:  No peripheral edema, tenderness + in bilateral lower legs  Neurologic:  Oriented x3, speech clear, hard of hearing  Skin:  No lesions or rashes  Access:  Right IJ PermCath    Basic Metabolic Panel: Recent Labs  Lab 04/17/20 2204 04/17/20 2204 04/18/20 0407 04/18/20 0407 04/19/20 0510 04/19/20 6213 04/20/20 0418 04/20/20 1050 04/21/20 0557  NA 142  --  142  --  143  --  141  --  139  K 3.4*  --  3.5  --  3.4*  --  2.6*  --  3.4*  CL 115*  --  117*  --  112*  --  107  --  104  CO2 20*  --  17*  --  24  --  25  --  27  GLUCOSE 142*  --  98  --  104*  --  87  --  128*  BUN 90*  --  90*  --  81*  --  56*  --  38*  CREATININE 3.81*  --  3.70*  --  3.22*  --  2.46*  --  2.31*  CALCIUM 9.5   < > 8.8*   < > 9.2  --  8.7*  --  8.9  MG 1.9  --    --   --   --   --   --   --   --   PHOS  --   --   --   --   --  2.4*  --  1.9* 1.5*   < > = values in this interval not displayed.    Liver Function Tests: Recent Labs  Lab 04/17/20 2204  AST 34  ALT 26  ALKPHOS 67  BILITOT 0.8  PROT 7.0  ALBUMIN 3.9   No results for input(s): LIPASE, AMYLASE in the last 168 hours. No results for input(s): AMMONIA in the last 168 hours.  CBC: Recent Labs  Lab 04/17/20 2204 04/19/20 0510 04/20/20 0418 04/21/20 0557  WBC 7.6 5.4 5.1 5.9  HGB 8.8* 8.6* 7.5* 7.7*  HCT 25.6* 24.4* 22.1* 23.0*  MCV 94.1 93.8 94.8 96.2  PLT 99* 70* 65* 61*    Cardiac Enzymes: No results for input(s): CKTOTAL, CKMB, CKMBINDEX, TROPONINI in the last 168  hours.  BNP: Invalid input(s): POCBNP  CBG: Recent Labs  Lab 04/19/20 1539 04/19/20 1703 04/20/20 0752  GLUCAP 94 102* 100*    Microbiology: Results for orders placed or performed during the hospital encounter of 04/17/20  Respiratory Panel by RT PCR (Flu A&B, Covid) - Nasopharyngeal Swab     Status: None   Collection Time: 04/17/20 10:35 PM   Specimen: Nasopharyngeal Swab  Result Value Ref Range Status   SARS Coronavirus 2 by RT PCR NEGATIVE NEGATIVE Final    Comment: (NOTE) SARS-CoV-2 target nucleic acids are NOT DETECTED.  The SARS-CoV-2 RNA is generally detectable in upper respiratoy specimens during the acute phase of infection. The lowest concentration of SARS-CoV-2 viral copies this assay can detect is 131 copies/mL. A negative result does not preclude SARS-Cov-2 infection and should not be used as the sole basis for treatment or other patient management decisions. A negative result may occur with  improper specimen collection/handling, submission of specimen other than nasopharyngeal swab, presence of viral mutation(s) within the areas targeted by this assay, and inadequate number of viral copies (<131 copies/mL). A negative result must be combined with clinical observations, patient  history, and epidemiological information. The expected result is Negative.  Fact Sheet for Patients:  PinkCheek.be  Fact Sheet for Healthcare Providers:  GravelBags.it  This test is no t yet approved or cleared by the [place] FDA and  has been authorized for detection and/or diagnosis of SARS-CoV-2 by FDA under an Emergency Use Authorization (EUA). This EUA will remain  in effect (meaning this test can be used) for the duration of the COVID-19 declaration under Section 564(b)(1) of the Act, 21 U.S.C. section 360bbb-3(b)(1), unless the authorization is terminated or revoked sooner.     Influenza A by PCR NEGATIVE NEGATIVE Final   Influenza B by PCR NEGATIVE NEGATIVE Final    Comment: (NOTE) The Xpert Xpress SARS-CoV-2/FLU/RSV assay is intended as an aid in  the diagnosis of influenza from Nasopharyngeal swab specimens and  should not be used as a sole basis for treatment. Nasal washings and  aspirates are unacceptable for Xpert Xpress SARS-CoV-2/FLU/RSV  testing.  Fact Sheet for Patients: PinkCheek.be  Fact Sheet for Healthcare Providers: GravelBags.it  This test is not yet approved or cleared by the [place] FDA and  has been authorized for detection and/or diagnosis of SARS-CoV-2 by  FDA under an Emergency Use Authorization (EUA). This EUA will remain  in effect (meaning this test can be used) for the duration of the  Covid-19 declaration under Section 564(b)(1) of the Act, 21  U.S.C. section 360bbb-3(b)(1), unless the authorization is  terminated or revoked. Performed at [facility], [address] [place], [place] [address]   Gastrointestinal Panel by PCR , Stool     Status: None   Collection Time: 04/18/20  9:49 AM   Specimen: Stool  Result Value Ref Range Status   Campylobacter species NOT DETECTED NOT DETECTED Final   Plesimonas  shigelloides NOT DETECTED NOT DETECTED Final   Salmonella species NOT DETECTED NOT DETECTED Final   Yersinia enterocolitica NOT DETECTED NOT DETECTED Final   Vibrio species NOT DETECTED NOT DETECTED Final   Vibrio cholerae NOT DETECTED NOT DETECTED Final   Enteroaggregative E coli (EAEC) NOT DETECTED NOT DETECTED Final   Enteropathogenic E coli (EPEC) NOT DETECTED NOT DETECTED Final   Enterotoxigenic E coli (ETEC) NOT DETECTED NOT DETECTED Final   Shiga like toxin producing E coli (STEC) NOT DETECTED NOT DETECTED Final  Shigella/Enteroinvasive E coli (EIEC) NOT DETECTED NOT DETECTED Final   Cryptosporidium NOT DETECTED NOT DETECTED Final   Cyclospora cayetanensis NOT DETECTED NOT DETECTED Final   Entamoeba histolytica NOT DETECTED NOT DETECTED Final   Giardia lamblia NOT DETECTED NOT DETECTED Final   Adenovirus F40/41 NOT DETECTED NOT DETECTED Final   Astrovirus NOT DETECTED NOT DETECTED Final   Norovirus GI/GII NOT DETECTED NOT DETECTED Final   Rotavirus A NOT DETECTED NOT DETECTED Final   Sapovirus (I, II, IV, and V) NOT DETECTED NOT DETECTED Final    Comment: Performed at [facility], [address] [place], [place] [address]    Coagulation Studies: No results for input(s): LABPROT, INR in the last 72 hours.  Urinalysis: No results for input(s): COLORURINE, LABSPEC, PHURINE, GLUCOSEU, HGBUR, BILIRUBINUR, KETONESUR, PROTEINUR, UROBILINOGEN, NITRITE, LEUKOCYTESUR in the last 72 hours.  Invalid input(s): APPERANCEUR    Imaging: PERIPHERAL VASCULAR CATHETERIZATION  Result Date: 04/19/2020 See op note    Medications:   . sodium chloride    . sodium chloride     . allopurinol  100 mg Oral Daily  . amLODipine  10 mg Oral Daily  . Chlorhexidine Gluconate Cloth  6 each Topical Q0600  . epoetin (EPOGEN/PROCRIT) injection  10,000 Units Intravenous Q M,W,F-HD  . fenofibrate  54 mg Oral Daily  . furosemide  60 mg Intravenous Q12H  . gabapentin  300 mg Oral QHS  .  heparin  5,000 Units Subcutaneous Q8H  . insulin glargine  48 Units Subcutaneous Daily  . iron polysaccharides  150 mg Oral Daily  . loratadine  10 mg Oral Daily  . mouth rinse  15 mL Mouth Rinse BID  . metoprolol tartrate  25 mg Oral BID  . pantoprazole  40 mg Oral Daily  . rOPINIRole  0.5 mg Oral QHS  . sertraline  50 mg Oral Daily  . sucralfate  1 g Oral BID  . [START ON 04/23/2020] Vitamin D (Ergocalciferol)  50,000 Units Oral Weekly   sodium chloride, sodium chloride, acetaminophen **OR** acetaminophen, albuterol, alteplase, dicyclomine, heparin, lidocaine (PF), lidocaine-prilocaine, LORazepam, nitroGLYCERIN, ondansetron **OR** ondansetron (ZOFRAN) IV, pentafluoroprop-tetrafluoroeth, traMADol, traZODone, triamcinolone  Assessment/ Plan:  86 y.o. female with past medical history of myelodysplastic syndrome, chronic kidney disease stage V baseline EGFR 14, diabetes mellitus type 2, hypertension, gout, coronary artery disease status post PTCA and CABG, peripheral neuropathy, GERD, hyperlipidemia, diabetic retinopathy, restless leg syndrome, anemia of chronic kidney disease, secondary hyperparathyroidism who presents with weakness, fatigue, and loose stools.  1.  ESRD Patient will receive her third session of dialysis today, tolerated previous two  treatments well  Lab Results  Component Value Date   CREATININE 2.31 (H) 04/21/2020   CREATININE 2.46 (H) 04/20/2020   CREATININE 3.22 (H) 04/19/2020    2.  Anemia of CKD hemoglobin 7.7  Low-dose Epogen with dialysis treatment started today   3.  Metabolic acidosis.   Bicarbonate 27 today We will continue monitoring  4.  Secondary hyperparathyroidism. Phosphorus down to 1.5 today Calcium 8.9 Will discuss findings with attending Nephrologist.  5.Hypokalemia K+3.4 today after  receiving IV and oral Potassium supplementation Will continue monitoring labs closely    LOS: 4 Izaya [place] 10/6/20212:36 PM

## 2020-04-21 NOTE — Progress Notes (Signed)
PROGRESS NOTE    Katherine Hernandez  POE:[id number] DOB: [birth year] DOA: 04/17/2020 PCP: Tracie Harrier, MD  Assessment & Plan:   Active Problems:   Acute CHF (congestive heart failure) (HCC)   Acute on chronic diastolic CHF: continue on IV lasix. Monitor I/Os. Cardio following   ESRD: s/p permacath on 10/4 and first HD 04/20/20. HD as per nephro   Thrombocytopenia: etiology unclear. Will continue to monitor   Hypokalemia: management w/ HD.   ACD: no need for a transfusion at this time. Will transfuse if Hb <7. Hematochezia likely due to hemorrhoids  HTN: continue on home dose of metoprolol, amlodipine   Gout: continue on allopurinol   HLD: continue on home dose of fenofibrate   Restless leg syndrome: continue on home dose of requip  GERD: continue on PPI   DM2: poorly controlled. Continue on SSI w/ accuchecks   Secondary hyperparathyroidism: secondary to CKD/ESRD  Obesity: BMI 31.9. Would benefit from weight loss     DVT prophylaxis: heparin  Code Status: full  Family Communication:  Disposition Plan: depends on PT/OT recs    Consultants:   Cardio   nephro   Vascular surg    Procedures: permacath placement    Antimicrobials:    Subjective: Pt c/o feeling cold   Objective: Vitals:   04/20/20 1621 04/20/20 2003 04/21/20 0422 04/21/20 0730  BP: (!) 152/46 (!) 157/59 (!) 161/119 (!) 175/55  Pulse: 74 75 68 68  Resp: 18 20 20 17   Temp: 99.4 F (37.4 C) 98.8 F (37.1 C) 98.4 F (36.9 C) 98.4 F (36.9 C)  TempSrc: Oral Oral Oral Oral  SpO2: 99% 99% 99% 98%  Weight:   74.3 kg   Height:        Intake/Output Summary (Last 24 hours) at 04/21/2020 0838 Last data filed at 04/21/2020 0423 Gross per 24 hour  Intake 480 ml  Output 200 ml  Net 280 ml   Filed Weights   04/20/20 0526 04/20/20 1357 04/21/20 0422  Weight: 74.7 kg 72.9 kg 74.3 kg    Examination:  General exam: Appears calm and comfortable  Respiratory system: Clear to  auscultation. Respiratory effort normal. Cardiovascular system: S1 & S2 +. No rubs, gallops or clicks.  Gastrointestinal system: Abdomen is nondistended, soft and nontender. Normal bowel sounds heard. Central nervous system: Alert and oriented. Moves all 4 extremities  Psychiatry: Judgement and insight appear normal. Flat mood and affect.     Data Reviewed: I have personally reviewed following labs and imaging studies  CBC: Recent Labs  Lab 04/17/20 2204 04/19/20 0510 04/20/20 0418 04/21/20 0557  WBC 7.6 5.4 5.1 5.9  HGB 8.8* 8.6* 7.5* 7.7*  HCT 25.6* 24.4* 22.1* 23.0*  MCV 94.1 93.8 94.8 96.2  PLT 99* 70* 65* 61*   Basic Metabolic Panel: Recent Labs  Lab 04/17/20 2204 04/18/20 0407 04/19/20 0510 04/19/20 0812 04/20/20 0418 04/20/20 1050 04/21/20 0557  NA 142 142 143  --  141  --  139  K 3.4* 3.5 3.4*  --  2.6*  --  3.4*  CL 115* 117* 112*  --  107  --  104  CO2 20* 17* 24  --  25  --  27  GLUCOSE 142* 98 104*  --  87  --  128*  BUN 90* 90* 81*  --  56*  --  38*  CREATININE 3.81* 3.70* 3.22*  --  2.46*  --  2.31*  CALCIUM 9.5 8.8* 9.2  --  8.7*  --  8.9  MG 1.9  --   --   --   --   --   --   PHOS  --   --   --  2.4*  --  1.9* 1.5*   GFR: Estimated Creatinine Clearance: 15.4 mL/min (A) (by C-G formula based on SCr of 2.31 mg/dL (H)). Liver Function Tests: Recent Labs  Lab 04/17/20 2204  AST 34  ALT 26  ALKPHOS 67  BILITOT 0.8  PROT 7.0  ALBUMIN 3.9   No results for input(s): LIPASE, AMYLASE in the last 168 hours. No results for input(s): AMMONIA in the last 168 hours. Coagulation Profile: No results for input(s): INR, PROTIME in the last 168 hours. Cardiac Enzymes: No results for input(s): CKTOTAL, CKMB, CKMBINDEX, TROPONINI in the last 168 hours. BNP (last 3 results) No results for input(s): PROBNP in the last 8760 hours. HbA1C: No results for input(s): HGBA1C in the last 72 hours. CBG: Recent Labs  Lab 04/19/20 1539 04/19/20 1703 04/20/20 0752    GLUCAP 94 102* 100*   Lipid Profile: No results for input(s): CHOL, HDL, LDLCALC, TRIG, CHOLHDL, LDLDIRECT in the last 72 hours. Thyroid Function Tests: No results for input(s): TSH, T4TOTAL, FREET4, T3FREE, THYROIDAB in the last 72 hours. Anemia Panel: No results for input(s): VITAMINB12, FOLATE, FERRITIN, TIBC, IRON, RETICCTPCT in the last 72 hours. Sepsis Labs: Recent Labs  Lab 04/17/20 2204 04/18/20 0018  PROCALCITON <0.10  --   LATICACIDVEN 1.4 1.4    Recent Results (from the past 240 hour(s))  Respiratory Panel by RT PCR (Flu A&B, Covid) - Nasopharyngeal Swab     Status: None   Collection Time: 04/17/20 10:35 PM   Specimen: Nasopharyngeal Swab  Result Value Ref Range Status   SARS Coronavirus 2 by RT PCR NEGATIVE NEGATIVE Final    Comment: (NOTE) SARS-CoV-2 target nucleic acids are NOT DETECTED.  The SARS-CoV-2 RNA is generally detectable in upper respiratoy specimens during the acute phase of infection. The lowest concentration of SARS-CoV-2 viral copies this assay can detect is 131 copies/mL. A negative result does not preclude SARS-Cov-2 infection and should not be used as the sole basis for treatment or other patient management decisions. A negative result may occur with  improper specimen collection/handling, submission of specimen other than nasopharyngeal swab, presence of viral mutation(s) within the areas targeted by this assay, and inadequate number of viral copies (<131 copies/mL). A negative result must be combined with clinical observations, patient history, and epidemiological information. The expected result is Negative.  Fact Sheet for Patients:  PinkCheek.be  Fact Sheet for Healthcare Providers:  GravelBags.it  This test is no t yet approved or cleared by the [place] FDA and  has been authorized for detection and/or diagnosis of SARS-CoV-2 by FDA under an Emergency Use Authorization  (EUA). This EUA will remain  in effect (meaning this test can be used) for the duration of the COVID-19 declaration under Section 564(b)(1) of the Act, 21 U.S.C. section 360bbb-3(b)(1), unless the authorization is terminated or revoked sooner.     Influenza A by PCR NEGATIVE NEGATIVE Final   Influenza B by PCR NEGATIVE NEGATIVE Final    Comment: (NOTE) The Xpert Xpress SARS-CoV-2/FLU/RSV assay is intended as an aid in  the diagnosis of influenza from Nasopharyngeal swab specimens and  should not be used as a sole basis for treatment. Nasal washings and  aspirates are unacceptable for Xpert Xpress SARS-CoV-2/FLU/RSV  testing.  Fact Sheet for Patients: PinkCheek.be  Fact Sheet for Healthcare  Providers: GravelBags.it  This test is not yet approved or cleared by the [place] and  has been authorized for detection and/or diagnosis of SARS-CoV-2 by  FDA under an Emergency Use Authorization (EUA). This EUA will remain  in effect (meaning this test can be used) for the duration of the  Covid-19 declaration under Section 564(b)(1) of the Act, 21  U.S.C. section 360bbb-3(b)(1), unless the authorization is  terminated or revoked. Performed at [facility], [address] [place], [place] [address]   Gastrointestinal Panel by PCR , Stool     Status: None   Collection Time: 04/18/20  9:49 AM   Specimen: Stool  Result Value Ref Range Status   Campylobacter species NOT DETECTED NOT DETECTED Final   Plesimonas shigelloides NOT DETECTED NOT DETECTED Final   Salmonella species NOT DETECTED NOT DETECTED Final   Yersinia enterocolitica NOT DETECTED NOT DETECTED Final   Vibrio species NOT DETECTED NOT DETECTED Final   Vibrio cholerae NOT DETECTED NOT DETECTED Final   Enteroaggregative E coli (EAEC) NOT DETECTED NOT DETECTED Final   Enteropathogenic E coli (EPEC) NOT DETECTED NOT DETECTED Final   Enterotoxigenic E coli  (ETEC) NOT DETECTED NOT DETECTED Final   Shiga like toxin producing E coli (STEC) NOT DETECTED NOT DETECTED Final   Shigella/Enteroinvasive E coli (EIEC) NOT DETECTED NOT DETECTED Final   Cryptosporidium NOT DETECTED NOT DETECTED Final   Cyclospora cayetanensis NOT DETECTED NOT DETECTED Final   Entamoeba histolytica NOT DETECTED NOT DETECTED Final   Giardia lamblia NOT DETECTED NOT DETECTED Final   Adenovirus F40/41 NOT DETECTED NOT DETECTED Final   Astrovirus NOT DETECTED NOT DETECTED Final   Norovirus GI/GII NOT DETECTED NOT DETECTED Final   Rotavirus A NOT DETECTED NOT DETECTED Final   Sapovirus (I, II, IV, and V) NOT DETECTED NOT DETECTED Final    Comment: Performed at [facility], [address], [place], [place] [address]         Radiology Studies: PERIPHERAL VASCULAR CATHETERIZATION  Result Date: 04/19/2020 See op note       Scheduled Meds: . allopurinol  100 mg Oral Daily  . amLODipine  10 mg Oral Daily  . Chlorhexidine Gluconate Cloth  6 each Topical Q0600  . epoetin (EPOGEN/PROCRIT) injection  10,000 Units Intravenous Q M,W,F-HD  . furosemide  60 mg Intravenous Q12H  . gabapentin  300 mg Oral QHS  . heparin  5,000 Units Subcutaneous Q8H  . insulin glargine  48 Units Subcutaneous Daily  . iron polysaccharides  150 mg Oral Daily  . loratadine  10 mg Oral Daily  . mouth rinse  15 mL Mouth Rinse BID  . metoprolol tartrate  25 mg Oral BID  . pantoprazole  40 mg Oral Daily  . rOPINIRole  0.5 mg Oral QHS  . sertraline  50 mg Oral Daily  . sucralfate  1 g Oral BID  . [START ON 04/23/2020] Vitamin D (Ergocalciferol)  50,000 Units Oral Weekly   Continuous Infusions: . sodium chloride    . sodium chloride       LOS: 4 days    Time spent: 30 mins     Wyvonnia Dusky, MD [facility] Pager [phone]  If 7PM-7AM, please contact night-coverage [email or web address] 04/21/2020, 8:38 AM

## 2020-04-22 ENCOUNTER — Encounter
Admission: RE | Admit: 2020-04-22 | Discharge: 2020-04-22 | Disposition: A | Discharge status: Home / Self Care | Payer: Medicare Other | Source: Ambulatory Visit | Attending: Internal Medicine | Admitting: Internal Medicine

## 2020-04-22 DIAGNOSIS — N186 End stage renal disease: Secondary | ICD-10-CM | POA: Diagnosis not present

## 2020-04-22 DIAGNOSIS — I5031 Acute diastolic (congestive) heart failure: Secondary | ICD-10-CM | POA: Diagnosis not present

## 2020-04-22 DIAGNOSIS — D696 Thrombocytopenia, unspecified: Secondary | ICD-10-CM | POA: Diagnosis not present

## 2020-04-22 LAB — COMPREHENSIVE METABOLIC PANEL
ALT: 25 U/L (ref 0–44)
AST: 36 U/L (ref 15–41)
Albumin: 3.7 g/dL (ref 3.5–5.0)
Alkaline Phosphatase: 63 U/L (ref 38–126)
Anion gap: 12 (ref 5–15)
BUN: 21 mg/dL (ref 8–23)
CO2: 27 mmol/L (ref 22–32)
Calcium: 9 mg/dL (ref 8.9–10.3)
Chloride: 96 mmol/L — ABNORMAL LOW (ref 98–111)
Creatinine, Ser: 1.9 mg/dL — ABNORMAL HIGH (ref 0.44–1.00)
GFR calc non Af Amer: 23 mL/min — ABNORMAL LOW (ref >60)
Glucose, Bld: 185 mg/dL — ABNORMAL HIGH (ref 70–99)
Potassium: 3 mmol/L — ABNORMAL LOW (ref 3.5–5.1)
Sodium: 135 mmol/L (ref 135–145)
Total Bilirubin: 1 mg/dL (ref 0.3–1.2)
Total Protein: 6.5 g/dL (ref 6.5–8.1)

## 2020-04-22 LAB — CBC
HCT: 27 % — ABNORMAL LOW (ref 36.0–46.0)
Hemoglobin: 9.1 g/dL — ABNORMAL LOW (ref 12.0–15.0)
MCH: 32.4 pg (ref 26.0–34.0)
MCHC: 33.7 g/dL (ref 30.0–36.0)
MCV: 96.1 fL (ref 80.0–100.0)
Platelets: 67 10*3/uL — ABNORMAL LOW (ref 150–400)
RBC: 2.81 MIL/uL — ABNORMAL LOW (ref 3.87–5.11)
RDW: 16.2 % — ABNORMAL HIGH (ref 11.5–15.5)
WBC: 7.3 10*3/uL (ref 4.0–10.5)
nRBC: 0 % (ref 0.0–0.2)

## 2020-04-22 LAB — RESPIRATORY PANEL BY RT PCR (FLU A&B, COVID)
Influenza A by PCR: NEGATIVE
Influenza B by PCR: NEGATIVE
SARS Coronavirus 2 by RT PCR: NEGATIVE

## 2020-04-22 MED ORDER — POTASSIUM PHOSPHATES 15 MMOLE/5ML IV SOLN
20.0000 mmol | Freq: Once | INTRAVENOUS | Status: DC
  Filled 2020-04-22: qty 6.67

## 2020-04-22 MED ORDER — POTASSIUM & SODIUM PHOSPHATES 280-160-250 MG PO PACK
1.0000 | PACK | Freq: Three times a day (TID) | ORAL | Status: DC
  Administered 2020-04-22: 1 via ORAL
  Filled 2020-04-22 (×3): qty 1

## 2020-04-22 MED ORDER — POTASSIUM CHLORIDE CRYS ER 20 MEQ PO TBCR: 40.0000 meq | EXTENDED_RELEASE_TABLET | Freq: Once | ORAL | Status: DC

## 2020-04-22 MED ORDER — POTASSIUM CHLORIDE CRYS ER 20 MEQ PO TBCR
40.0000 meq | EXTENDED_RELEASE_TABLET | Freq: Once | ORAL | Status: AC
  Administered 2020-04-22: 40 meq via ORAL
  Filled 2020-04-22: qty 2

## 2020-04-22 NOTE — Progress Notes (Signed)
[facility]  ROUNDING NOTE   Subjective:  Patient reports some fatigue today, otherwise no acute complaints. Family at the bedside. Discharge planning for outpatient dialysis dicussed.No dialysis today.    Objective:  Vital signs in last 24 hours:  Temp:  [98.3 F (36.8 C)-99 F (37.2 C)] 98.7 F (37.1 C) (10/07 0840) Pulse Rate:  [67-84] 74 (10/07 0840) Resp:  [15-26] 17 (10/07 0341) BP: (132-182)/(51-112) 176/51 (10/07 0840) SpO2:  [98 %-100 %] 100 % (10/07 0840) Weight:  [70.8 kg-71.6 kg] 71.6 kg (10/07 0341)  Weight change: -2.132 kg Filed Weights   04/21/20 0422 04/21/20 1422 04/22/20 0341  Weight: 74.3 kg 70.8 kg 71.6 kg    Intake/Output: I/O last 3 completed shifts: In: 240 [P.O.:240] Out: 200 [Urine:200]   Intake/Output this shift:  Total I/O In: 120 [P.O.:120] Out: -   Physical Exam: General:  Sitting up in bed, brushing her teeth, daughter at the bedside  Head:  Normocephalic, atraumatic oral mucosal membranes moist  Eyes:  Anicteric  Neck:  Supple  Lungs:   Lungs clear bilaterally  Heart:  Regular   Abdomen:   Soft, nontender, nondistended  Extremities:  No peripheral edema, tenderness + in bilateral lower legs  Neurologic:  Oriented x3, speech clear and appropriate  Skin:  No acute lesions or rashes  Access:  Right IJ PermCath    Basic Metabolic Panel: Recent Labs  Lab 04/17/20 2204 04/17/20 2204 04/18/20 0407 04/18/20 0407 04/19/20 0510 04/19/20 0510 04/19/20 4034 04/20/20 0418 04/20/20 1050 04/21/20 0557 04/21/20 2145 04/22/20 0602  NA 142   < > 142  --  143  --   --  141  --  139  --  135  K 3.4*   < > 3.5  --  3.4*  --   --  2.6*  --  3.4*  --  3.0*  CL 115*   < > 117*  --  112*  --   --  107  --  104  --  96*  CO2 20*   < > 17*  --  24  --   --  25  --  27  --  27  GLUCOSE 142*   < > 98  --  104*  --   --  87  --  128*  --  185*  BUN 90*   < > 90*  --  81*  --   --  56*  --  38*  --  21  CREATININE 3.81*   < > 3.70*   --  3.22*  --   --  2.46*  --  2.31*  --  1.90*  CALCIUM 9.5   < > 8.8*   < > 9.2   < >  --  8.7*  --  8.9  --  9.0  MG 1.9  --   --   --   --   --   --   --   --   --   --   --   PHOS  --   --   --   --   --   --  2.4*  --  1.9* 1.5* 1.7*  --    < > = values in this interval not displayed.    Liver Function Tests: Recent Labs  Lab 04/17/20 2204 04/22/20 0602  AST 34 36  ALT 26 25  ALKPHOS 67 63  BILITOT 0.8 1.0  PROT 7.0 6.5  ALBUMIN 3.9  3.7   No results for input(s): LIPASE, AMYLASE in the last 168 hours. No results for input(s): AMMONIA in the last 168 hours.  CBC: Recent Labs  Lab 04/17/20 2204 04/19/20 0510 04/20/20 0418 04/21/20 0557 04/22/20 0602  WBC 7.6 5.4 5.1 5.9 7.3  HGB 8.8* 8.6* 7.5* 7.7* 9.1*  HCT 25.6* 24.4* 22.1* 23.0* 27.0*  MCV 94.1 93.8 94.8 96.2 96.1  PLT 99* 70* 65* 61* 67*    Cardiac Enzymes: No results for input(s): CKTOTAL, CKMB, CKMBINDEX, TROPONINI in the last 168 hours.  BNP: Invalid input(s): POCBNP  CBG: Recent Labs  Lab 04/19/20 1539 04/19/20 1703 04/20/20 0752 04/21/20 1438  GLUCAP 94 102* 100* 105*    Microbiology: Results for orders placed or performed during the hospital encounter of 04/17/20  Respiratory Panel by RT PCR (Flu A&B, Covid) - Nasopharyngeal Swab     Status: None   Collection Time: 04/17/20 10:35 PM   Specimen: Nasopharyngeal Swab  Result Value Ref Range Status   SARS Coronavirus 2 by RT PCR NEGATIVE NEGATIVE Final    Comment: (NOTE) SARS-CoV-2 target nucleic acids are NOT DETECTED.  The SARS-CoV-2 RNA is generally detectable in upper respiratoy specimens during the acute phase of infection. The lowest concentration of SARS-CoV-2 viral copies this assay can detect is 131 copies/mL. A negative result does not preclude SARS-Cov-2 infection and should not be used as the sole basis for treatment or other patient management decisions. A negative result may occur with  improper specimen collection/handling,  submission of specimen other than nasopharyngeal swab, presence of viral mutation(s) within the areas targeted by this assay, and inadequate number of viral copies (<131 copies/mL). A negative result must be combined with clinical observations, patient history, and epidemiological information. The expected result is Negative.  Fact Sheet for Patients:  PinkCheek.be  Fact Sheet for Healthcare Providers:  GravelBags.it  This test is no t yet approved or cleared by the [place] FDA and  has been authorized for detection and/or diagnosis of SARS-CoV-2 by FDA under an Emergency Use Authorization (EUA). This EUA will remain  in effect (meaning this test can be used) for the duration of the COVID-19 declaration under Section 564(b)(1) of the Act, 21 U.S.C. section 360bbb-3(b)(1), unless the authorization is terminated or revoked sooner.     Influenza A by PCR NEGATIVE NEGATIVE Final   Influenza B by PCR NEGATIVE NEGATIVE Final    Comment: (NOTE) The Xpert Xpress SARS-CoV-2/FLU/RSV assay is intended as an aid in  the diagnosis of influenza from Nasopharyngeal swab specimens and  should not be used as a sole basis for treatment. Nasal washings and  aspirates are unacceptable for Xpert Xpress SARS-CoV-2/FLU/RSV  testing.  Fact Sheet for Patients: PinkCheek.be  Fact Sheet for Healthcare Providers: GravelBags.it  This test is not yet approved or cleared by the [place] FDA and  has been authorized for detection and/or diagnosis of SARS-CoV-2 by  FDA under an Emergency Use Authorization (EUA). This EUA will remain  in effect (meaning this test can be used) for the duration of the  Covid-19 declaration under Section 564(b)(1) of the Act, 21  U.S.C. section 360bbb-3(b)(1), unless the authorization is  terminated or revoked. Performed at [facility], [address] [place], [place] [address]   Gastrointestinal Panel by PCR , Stool     Status: None   Collection Time: 04/18/20  9:49 AM   Specimen: Stool  Result Value Ref Range Status   Campylobacter species NOT DETECTED NOT  DETECTED Final   Plesimonas shigelloides NOT DETECTED NOT DETECTED Final   Salmonella species NOT DETECTED NOT DETECTED Final   Yersinia enterocolitica NOT DETECTED NOT DETECTED Final   Vibrio species NOT DETECTED NOT DETECTED Final   Vibrio cholerae NOT DETECTED NOT DETECTED Final   Enteroaggregative E coli (EAEC) NOT DETECTED NOT DETECTED Final   Enteropathogenic E coli (EPEC) NOT DETECTED NOT DETECTED Final   Enterotoxigenic E coli (ETEC) NOT DETECTED NOT DETECTED Final   Shiga like toxin producing E coli (STEC) NOT DETECTED NOT DETECTED Final   Shigella/Enteroinvasive E coli (EIEC) NOT DETECTED NOT DETECTED Final   Cryptosporidium NOT DETECTED NOT DETECTED Final   Cyclospora cayetanensis NOT DETECTED NOT DETECTED Final   Entamoeba histolytica NOT DETECTED NOT DETECTED Final   Giardia lamblia NOT DETECTED NOT DETECTED Final   Adenovirus F40/41 NOT DETECTED NOT DETECTED Final   Astrovirus NOT DETECTED NOT DETECTED Final   Norovirus GI/GII NOT DETECTED NOT DETECTED Final   Rotavirus A NOT DETECTED NOT DETECTED Final   Sapovirus (I, II, IV, and V) NOT DETECTED NOT DETECTED Final    Comment: Performed at [facility], [address] [place], [place] [address]  Respiratory Panel by RT PCR (Flu A&B, Covid) - Nasopharyngeal Swab     Status: None   Collection Time: 04/22/20 11:43 AM   Specimen: Nasopharyngeal Swab  Result Value Ref Range Status   SARS Coronavirus 2 by RT PCR NEGATIVE NEGATIVE Final    Comment: (NOTE) SARS-CoV-2 target nucleic acids are NOT DETECTED.  The SARS-CoV-2 RNA is generally detectable in upper respiratoy specimens during the acute phase of infection. The lowest concentration of SARS-CoV-2 viral copies this assay can detect  is 131 copies/mL. A negative result does not preclude SARS-Cov-2 infection and should not be used as the sole basis for treatment or other patient management decisions. A negative result may occur with  improper specimen collection/handling, submission of specimen other than nasopharyngeal swab, presence of viral mutation(s) within the areas targeted by this assay, and inadequate number of viral copies (<131 copies/mL). A negative result must be combined with clinical observations, patient history, and epidemiological information. The expected result is Negative.  Fact Sheet for Patients:  PinkCheek.be  Fact Sheet for Healthcare Providers:  GravelBags.it  This test is no t yet approved or cleared by the [place] FDA and  has been authorized for detection and/or diagnosis of SARS-CoV-2 by FDA under an Emergency Use Authorization (EUA). This EUA will remain  in effect (meaning this test can be used) for the duration of the COVID-19 declaration under Section 564(b)(1) of the Act, 21 U.S.C. section 360bbb-3(b)(1), unless the authorization is terminated or revoked sooner.     Influenza A by PCR NEGATIVE NEGATIVE Final   Influenza B by PCR NEGATIVE NEGATIVE Final    Comment: (NOTE) The Xpert Xpress SARS-CoV-2/FLU/RSV assay is intended as an aid in  the diagnosis of influenza from Nasopharyngeal swab specimens and  should not be used as a sole basis for treatment. Nasal washings and  aspirates are unacceptable for Xpert Xpress SARS-CoV-2/FLU/RSV  testing.  Fact Sheet for Patients: PinkCheek.be  Fact Sheet for Healthcare Providers: GravelBags.it  This test is not yet approved or cleared by the [place] FDA and  has been authorized for detection and/or diagnosis of SARS-CoV-2 by  FDA under an Emergency Use Authorization (EUA). This EUA will remain  in effect  (meaning this test can be used) for the duration of the  Covid-19 declaration  under Section 564(b)(1) of the Act, 21  U.S.C. section 360bbb-3(b)(1), unless the authorization is  terminated or revoked. Performed at [facility], [address] [place], [place] [address]     Coagulation Studies: No results for input(s): LABPROT, INR in the last 72 hours.  Urinalysis: No results for input(s): COLORURINE, LABSPEC, PHURINE, GLUCOSEU, HGBUR, BILIRUBINUR, KETONESUR, PROTEINUR, UROBILINOGEN, NITRITE, LEUKOCYTESUR in the last 72 hours.  Invalid input(s): APPERANCEUR    Imaging: No results found.   Medications:   . sodium chloride    . sodium chloride     . allopurinol  100 mg Oral Daily  . amLODipine  10 mg Oral Daily  . Chlorhexidine Gluconate Cloth  6 each Topical Q0600  . epoetin (EPOGEN/PROCRIT) injection  10,000 Units Intravenous Q M,W,F-HD  . furosemide  60 mg Intravenous Q12H  . gabapentin  300 mg Oral QHS  . heparin  5,000 Units Subcutaneous Q8H  . insulin glargine  48 Units Subcutaneous Daily  . iron polysaccharides  150 mg Oral Daily  . loratadine  10 mg Oral Daily  . mouth rinse  15 mL Mouth Rinse BID  . metoprolol tartrate  25 mg Oral BID  . pantoprazole  40 mg Oral Daily  . potassium & sodium phosphates  1 packet Oral TID WC & HS  . rOPINIRole  0.5 mg Oral QHS  . sertraline  50 mg Oral Daily  . sucralfate  1 g Oral BID  . [START ON 04/23/2020] Vitamin D (Ergocalciferol)  50,000 Units Oral Weekly   sodium chloride, sodium chloride, acetaminophen **OR** acetaminophen, albuterol, alteplase, dicyclomine, heparin, lidocaine (PF), lidocaine-prilocaine, LORazepam, nitroGLYCERIN, ondansetron **OR** ondansetron (ZOFRAN) IV, pentafluoroprop-tetrafluoroeth, traMADol, traZODone, triamcinolone  Assessment/ Plan:  86 y.o. female with past medical history of myelodysplastic syndrome, chronic kidney disease stage V baseline EGFR 14, diabetes mellitus type 2,  hypertension, gout, coronary artery disease status post PTCA and CABG, peripheral neuropathy, GERD, hyperlipidemia, diabetic retinopathy, restless leg syndrome, anemia of chronic kidney disease, secondary hyperparathyroidism who presents with weakness, fatigue, and loose stools.  1.  ESRD Volume and electrolyte status acceptable No dialysis planned for today Will follow MWF schedule as outpatient if getting discharged today Out patient dialysis arrangement at [facility] MWF 11:15am   Lab Results  Component Value Date   CREATININE 1.90 (H) 04/22/2020   CREATININE 2.31 (H) 04/21/2020   CREATININE 2.46 (H) 04/20/2020    2.  Anemia of CKD Hemoglobin 9.1 today Epogen started with dialysis MWF   3.  Metabolic acidosis.   Bicarbonate 27 today   4.  Secondary hyperparathyroidism. Phosphorus down to 1.7, started on Phos supplements Calcium 9.0   5.Hypokalemia K+3.0 KLOR-CON 40 meq one dose today    LOS: 5 Kathrina Crosley 10/7/202112:39 PM

## 2020-04-22 NOTE — Progress Notes (Signed)
Patient medically accepted at [facility] MWF 11:15am for outpatient hemodialysis. Patient can start tomorrow 10/8 at 10:45am. Please contact me with any other dialysis placement concerns.   Elvera Bicker Dialysis Coordinator [phone]

## 2020-04-22 NOTE — NC FL2 (Signed)
[place] LEVEL OF CARE SCREENING TOOL     IDENTIFICATION  Patient Name: Katherine Hernandez Birthdate: [birth year] Sex: female Admission Date (Current Location): 04/17/2020  [facility] and [place] Number:  Engineering geologist and Address:  [facility], [address], [place], [place] [address]      Provider Number: [id number]  Attending Physician Name and Address:  Wyvonnia Dusky, MD  Relative Name and Phone Number:       Current Level of Care: Hospital Recommended Level of Care: [place] Prior Approval Number:    Date Approved/Denied:   PASRR Number:    Discharge Plan: SNF    Current Diagnoses: Patient Active Problem List   Diagnosis Date Noted   ESRD (end stage renal disease) ()    Acute CHF (congestive heart failure) ([place]) 04/17/2020   CKD (chronic kidney disease) 03/17/2020   MDS (myelodysplastic syndrome) ([place]) 12/03/2017   Normocytic anemia 12/03/2017   Thrombocytopenia ([place]) 12/03/2017   HTN (hypertension) 06/07/2017   Atrial fibrillation ([place]) 06/07/2017   Diabetes ([place]) 06/07/2017   Lymphedema 06/07/2017   CHF (congestive heart failure) ([place]) 05/25/2017    Orientation RESPIRATION BLADDER Height & Weight     Self, Time, Situation, Place  Normal Incontinent Weight: 71.6 kg Height:  5' (152.4 cm)  BEHAVIORAL SYMPTOMS/MOOD NEUROLOGICAL BOWEL NUTRITION STATUS      Continent Diet (REgular)  AMBULATORY STATUS COMMUNICATION OF NEEDS Skin   Limited Assist Verbally Normal                       Personal Care Assistance Level of Assistance  Bathing, Dressing Bathing Assistance: Limited assistance   Dressing Assistance: Limited assistance     Functional Limitations Info  Sight, Hearing Sight Info: Impaired (glasses) Hearing Info: Impaired (hearing aids)      [facility]  PT (By licensed PT), OT (By licensed OT)     PT Frequency: 5x a week OT Frequency: 5x a  week            Contractures Contractures Info: Not present    Additional Factors Info  Code Status, Allergies Code Status Info: Full Allergies Info: amoxicillinm defdinir, doxycycline, eliquis, erythromycin, lisinopril, macrodantin, nobic, statisn, sulfa           Current Medications (04/22/2020):  This is the current hospital active medication list Current Facility-Administered Medications  Medication Dose Route Frequency Provider Last Rate Last Admin   0.9 %  sodium chloride infusion  100 mL Intravenous PRN Dew, Erskine Squibb, MD       0.9 %  sodium chloride infusion  100 mL Intravenous PRN Lucky Cowboy, Erskine Squibb, MD       acetaminophen (TYLENOL) tablet 650 mg  650 mg Oral Q6H PRN Algernon Huxley, MD   650 mg at 04/19/20 2041   Or   acetaminophen (TYLENOL) suppository 650 mg  650 mg Rectal Q6H PRN Algernon Huxley, MD       albuterol (PROVENTIL) (2.5 MG/3ML) 0.083% nebulizer solution 3 mL  3 mL Inhalation Q6H PRN Algernon Huxley, MD       allopurinol (ZYLOPRIM) tablet 100 mg  100 mg Oral Daily Algernon Huxley, MD   100 mg at 04/22/20 0832   alteplase (CATHFLO ACTIVASE) injection 2 mg  2 mg Intracatheter Once PRN Algernon Huxley, MD       amLODipine (NORVASC) tablet 10 mg  10 mg Oral Daily Dew, Erskine Squibb, MD  10 mg at 04/22/20 0952   Chlorhexidine Gluconate Cloth 2 % PADS 6 each  6 each Topical Q0600 Algernon Huxley, MD   6 each at 04/22/20 0557   dicyclomine (BENTYL) capsule 10 mg  10 mg Oral QID PRN Algernon Huxley, MD   10 mg at 04/22/20 0831   epoetin alfa (EPOGEN) injection 10,000 Units  10,000 Units Intravenous Q M,W,F-HD Max Sane, MD   10,000 Units at 04/21/20 1237   furosemide (LASIX) injection 60 mg  60 mg Intravenous Q12H Algernon Huxley, MD   60 mg at 04/22/20 0834   gabapentin (NEURONTIN) capsule 300 mg  300 mg Oral QHS Algernon Huxley, MD   300 mg at 04/21/20 2216   heparin injection 1,000 Units  1,000 Units Dialysis PRN Algernon Huxley, MD       heparin injection 5,000 Units  5,000 Units  Subcutaneous Q8H Algernon Huxley, MD   5,000 Units at 04/22/20 0834   insulin glargine (LANTUS) injection 48 Units  48 Units Subcutaneous Daily Algernon Huxley, MD   48 Units at 04/22/20 6270   iron polysaccharides (NIFEREX) capsule 150 mg  150 mg Oral Daily Algernon Huxley, MD   150 mg at 04/22/20 0832   lidocaine (PF) (XYLOCAINE) 1 % injection 5 mL  5 mL Intradermal PRN Algernon Huxley, MD       lidocaine-prilocaine (EMLA) cream 1 application  1 application Topical PRN Algernon Huxley, MD       loratadine (CLARITIN) tablet 10 mg  10 mg Oral Daily Algernon Huxley, MD   10 mg at 04/22/20 0831   LORazepam (ATIVAN) tablet 0.5 mg  0.5 mg Oral Q6H PRN Algernon Huxley, MD       MEDLINE mouth rinse  15 mL Mouth Rinse BID Algernon Huxley, MD   15 mL at 04/20/20 0921   metoprolol tartrate (LOPRESSOR) tablet 25 mg  25 mg Oral BID Algernon Huxley, MD   25 mg at 04/22/20 3500   nitroGLYCERIN (NITROSTAT) SL tablet 0.4 mg  0.4 mg Sublingual Q5 min PRN Algernon Huxley, MD       ondansetron [facility]) tablet 4 mg  4 mg Oral Q6H PRN Algernon Huxley, MD       Or   ondansetron (ZOFRAN) injection 4 mg  4 mg Intravenous Q6H PRN Algernon Huxley, MD       pantoprazole (PROTONIX) EC tablet 40 mg  40 mg Oral Daily Algernon Huxley, MD   40 mg at 04/22/20 0831   pentafluoroprop-tetrafluoroeth (GEBAUERS) aerosol 1 application  1 application Topical PRN Dew, Erskine Squibb, MD       potassium PHOSPHATE 20 mmol in dextrose 5 % 500 mL infusion  20 mmol Intravenous Once Lateef, [place], MD       rOPINIRole (REQUIP) tablet 0.5 mg  0.5 mg Oral QHS Algernon Huxley, MD   0.5 mg at 04/21/20 2215   sertraline (ZOLOFT) tablet 50 mg  50 mg Oral Daily Algernon Huxley, MD   50 mg at 04/22/20 0830   sucralfate (CARAFATE) tablet 1 g  1 g Oral BID Algernon Huxley, MD   1 g at 04/22/20 0831   traMADol (ULTRAM) tablet 50 mg  50 mg Oral Q12H PRN Algernon Huxley, MD   50 mg at 04/21/20 1852   traZODone (DESYREL) tablet 25 mg  25 mg Oral QHS PRN Algernon Huxley, MD   25 mg at  04/21/20  2221   triamcinolone (NASACORT) nasal inhaler 2 spray  2 spray Each Nare Daily PRN Algernon Huxley, MD       [START ON 04/23/2020] Vitamin D (Ergocalciferol) (DRISDOL) capsule 50,000 Units  50,000 Units Oral Weekly Dew, Erskine Squibb, MD         Discharge Medications: Please see discharge summary for a list of discharge medications.  Relevant Imaging Results:  Relevant Lab Results:   Additional Information    Victorino Dike, RN

## 2020-04-22 NOTE — Plan of Care (Signed)
  Problem: Education: Goal: Knowledge of General Education information will improve Description: Including pain rating scale, medication(s)/side effects and non-pharmacologic comfort measures Outcome: Adequate for Discharge   Problem: Health Behavior/Discharge Planning: Goal: Ability to manage health-related needs will improve Outcome: Adequate for Discharge   Problem: Clinical Measurements: Goal: Ability to maintain clinical measurements within normal limits will improve Outcome: Adequate for Discharge Goal: Will remain free from infection Outcome: Adequate for Discharge Goal: Diagnostic test results will improve Outcome: Adequate for Discharge Goal: Respiratory complications will improve Outcome: Adequate for Discharge Goal: Cardiovascular complication will be avoided Outcome: Adequate for Discharge   Problem: Activity: Goal: Risk for activity intolerance will decrease Outcome: Adequate for Discharge   Problem: Nutrition: Goal: Adequate nutrition will be maintained Outcome: Adequate for Discharge   Problem: Coping: Goal: Level of anxiety will decrease Outcome: Adequate for Discharge   Problem: Elimination: Goal: Will not experience complications related to bowel motility Outcome: Adequate for Discharge Goal: Will not experience complications related to urinary retention Outcome: Adequate for Discharge   Problem: Pain Managment: Goal: General experience of comfort will improve Outcome: Adequate for Discharge   Problem: Safety: Goal: Ability to remain free from injury will improve Outcome: Adequate for Discharge   Problem: Skin Integrity: Goal: Risk for impaired skin integrity will decrease Outcome: Adequate for Discharge   Problem: Education: Goal: Knowledge of disease and its progression will improve Outcome: Adequate for Discharge   Problem: Health Behavior/Discharge Planning: Goal: Ability to manage health-related needs will improve Outcome: Adequate for  Discharge   Problem: Clinical Measurements: Goal: Complications related to the disease process or treatment will be avoided or minimized Outcome: Adequate for Discharge Goal: Dialysis access will remain free of complications Outcome: Adequate for Discharge   Problem: Activity: Goal: Activity intolerance will improve Outcome: Adequate for Discharge   Problem: Fluid Volume: Goal: Fluid volume balance will be maintained or improved Outcome: Adequate for Discharge   Problem: Nutritional: Goal: Ability to make appropriate dietary choices will improve Outcome: Adequate for Discharge   Problem: Respiratory: Goal: Respiratory symptoms related to disease process will be avoided Outcome: Adequate for Discharge   Problem: Self-Concept: Goal: Body image disturbance will be avoided or minimized Outcome: Adequate for Discharge   Problem: Urinary Elimination: Goal: Progression of disease will be identified and treated Outcome: Adequate for Discharge   Problem: Education: Goal: Ability to demonstrate management of disease process will improve Outcome: Adequate for Discharge Goal: Ability to verbalize understanding of medication therapies will improve Outcome: Adequate for Discharge Goal: Individualized Educational Video(s) Outcome: Adequate for Discharge   Problem: Activity: Goal: Capacity to carry out activities will improve Outcome: Adequate for Discharge   Problem: Cardiac: Goal: Ability to achieve and maintain adequate cardiopulmonary perfusion will improve Outcome: Adequate for Discharge

## 2020-04-22 NOTE — Discharge Summary (Signed)
Physician Discharge Summary  Fronie Holstein Fein CBS:[id number] DOB: Aug 14, 1929 DOA: 04/17/2020  PCP: Tracie Harrier, MD  Admit date: 04/17/2020 Discharge date: 04/22/2020  Admitted From: home Disposition:  SNF  Recommendations for Outpatient Follow-up:  1. Follow up with PCP in 1-2 weeks 2. F/u nephro, Dr. Holley Raring, in 1-2 weeks  Home Health: no  Equipment/Devices:  Discharge Condition: stable  CODE STATUS: full  Diet recommendation: Heart Healthy / Carb Modified   Brief/Interim Summary: HPI was taken from Dr. Sharman Crate Mcclurg  is a 86 y.o. Caucasian femalewith a known history of stage V chronic kidney disease, diastolic CHF, coronary artery disease, type 2 diabetes mellitus, hypertension dyslipidemia, presented to the emergency room with acute onset of worsening dyspnea that started todayas well as orthopnea and paroxysmal nocturnal dyspnea. She has been having worsening lower extremity edema as well as dry cough and wheezing. She denies any fever or chills. She denies any nausea or vomiting or abdominal pain. She admitted to diarrhea which has been intermittent over the last several days, occasionally watery and loose. She would notice minimal amount of blood when she wipes. No bright red bleeding per rectum or melanotic stools. No headache or dizziness or blurred vision.  Upon presentation to the emergency room, blood pressure was 81/63 with respiratory rate of 24 and otherwise normal vital signs. Blood pressure later on was up to 171/60 then 168/61. Labs revealed VBG with pH 7.3 bicarbonate of 17.2 and PCO2 of 35. CMP remarkable for mild hypokalemia of 3.4 BUN of 90 and creatinine 3.81 compared to 74 and 3.61 on 03/17/2020. Magnesium was 1.9 and LFTs were unremarkable. High-sensitivity troponin I was 29 and CBC showed anemia close to her baseline with hemoglobin 8.8 and hematocrit 25.6. Chest x-ray showed mild cardiomegaly with pulmonary vascular congestion.EKG showed sinus rhythm  with rate of 76 with Q waves in V1, left atrial enlargement, lateral T wave inversion and prolonged QT interval with QTC of 505 MS.  The patient was given 80 mg IV Lasix and 40 mEq p.o. potassium chloride. She will be admitted to a progressive unit bed for further evaluation and management.  From Dr. Lenise Herald 10/6-10/7/21: Pt presented w/ shortness of breath secondary to CHF exacerbation. Pt was started on IV lasix initially and pt's Cr continued to worsen. Pt had hx of CKDV and started having uremic symptoms. Pt was started on HD as per nephro. Pt has tolerated HD fairly well while inpatient and will continue as an outpatient. PT/OT saw the pt and recommended SNF. For more information, please see other progress notes.  Discharge Diagnoses:  Active Problems:   Acute CHF (congestive heart failure) (HCC)   ESRD (end stage renal disease) (HCC)  Acute on chronic diastolic CHF: s/p lasix given. Monitor I/Os. Cardio following   ESRD: s/p permacath on 10/4 and first HD 04/20/20. HD as per nephro   Thrombocytopenia: etiology unclear. Will continue to monitor   Hypokalemia: management w/ HD.   ACD: no need for a transfusion at this time. Will transfuse if Hb <7. Hematochezia likely due to hemorrhoids  HTN: continue on home dose of metoprolol, amlodipine   Gout: continue on allopurinol   HLD: fenofibrate d/c as contraindicated in HD pts   Restless leg syndrome: continue on home dose of requip  GERD: continue on PPI   DM2: poorly controlled. Continue on SSI w/ accuchecks while inpatient only    Secondary hyperparathyroidism: secondary to CKD/ESRD  Obesity: BMI 31.9. Would benefit from weight loss  Discharge Instructions  Discharge Instructions    Diet - low sodium heart healthy   Complete by: As directed    Diet Carb Modified   Complete by: As directed    Discharge instructions   Complete by: As directed    F/u w/ PCP in 1-2 weeks. F/u nephro, Dr. Holley Raring, in 1 week    Increase activity slowly   Complete by: As directed    No wound care   Complete by: As directed      Allergies as of 04/22/2020      Reactions   Amoxicillin Diarrhea   Cefdinir Diarrhea   Doxycycline Diarrhea   Eliquis [apixaban] Other (See Comments)   Dizziness   Erythromycin Diarrhea   Lisinopril Cough   Macrodantin [nitrofurantoin] Diarrhea   Mobic [meloxicam]    diarrhea   Statins Other (See Comments)   Joint pain   Sulfa Antibiotics Hives      Medication List    STOP taking these medications   fenofibrate 48 MG tablet Commonly known as: TRICOR     TAKE these medications   albuterol 108 (90 Base) MCG/ACT inhaler Commonly known as: VENTOLIN HFA Inhale 2 puffs every 6 (six) hours as needed into the lungs for wheezing or shortness of breath.   allopurinol 100 MG tablet Commonly known as: ZYLOPRIM Take 200 mg by mouth daily.   amLODipine 10 MG tablet Commonly known as: NORVASC Take 10 mg by mouth daily.   B-12 COMPLIANCE INJECTION IJ Inject as directed every 30 (thirty) days.   cetirizine 10 MG tablet Commonly known as: ZYRTEC Take 10 mg by mouth daily.   dicyclomine 10 MG capsule Commonly known as: BENTYL Take 10 mg by mouth 4 (four) times daily as needed.   ergocalciferol 1.25 MG (50000 UT) capsule Commonly known as: VITAMIN D2 Take 50,000 Units by mouth once a week.   furosemide 80 MG tablet Commonly known as: LASIX Take 80 mg by mouth daily.   gabapentin 300 MG capsule Commonly known as: NEURONTIN Take 300 mg by mouth 2 (two) times daily. Pt takes 1 capsule in the morning and 2 capsules at bedtime.   iron polysaccharides 150 MG capsule Commonly known as: NIFEREX Take 150 mg by mouth daily.   Lantus SoloStar 100 UNIT/ML Solostar Pen Generic drug: insulin glargine Inject 48 Units into the skin daily.   liraglutide 18 MG/3ML Sopn Commonly known as: VICTOZA Inject 1.8 mg into the skin daily.   LORazepam 0.5 MG tablet Commonly known as:  ATIVAN Take 0.5 mg by mouth daily as needed for anxiety.   losartan 100 MG tablet Commonly known as: COZAAR Take 100 mg by mouth daily.   metoprolol tartrate 25 MG tablet Commonly known as: LOPRESSOR Take 25 mg by mouth 2 (two) times daily.   nitroGLYCERIN 0.4 MG SL tablet Commonly known as: NITROSTAT Place 0.4 mg under the tongue every 5 (five) minutes as needed for chest pain.   pantoprazole 40 MG tablet Commonly known as: PROTONIX Take 40 mg by mouth daily.   Premarin vaginal cream Generic drug: conjugated estrogens INSERT 0.5 GRAMS VAGINALLY  TWICE WEEKLY   rOPINIRole 0.5 MG tablet Commonly known as: REQUIP Take 0.5 mg by mouth at bedtime.   sertraline 50 MG tablet Commonly known as: ZOLOFT Take 50 mg by mouth daily.   sucralfate 1 g tablet Commonly known as: CARAFATE Take 1 g by mouth 2 (two) times daily.   traMADol 50 MG tablet Commonly known as: ULTRAM Take 50  mg by mouth every 12 (twelve) hours as needed.   triamcinolone 55 MCG/ACT Aero nasal inhaler Commonly known as: NASACORT 2 sprays daily.   Tylenol 8 Hour Arthritis Pain 650 MG CR tablet Generic drug: acetaminophen Take 1,300 mg by mouth in the morning.       Follow-up Information    [facility] Follow up on 04/28/2020.   Specialty: Cardiology Why: at 11:30am. Enter through the [facility] entrance Contact information: [address] [place]             Allergies  Allergen Reactions  . Amoxicillin Diarrhea  . Cefdinir Diarrhea  . Doxycycline Diarrhea  . Eliquis [Apixaban] Other (See Comments)    Dizziness   . Erythromycin Diarrhea  . Lisinopril Cough  . Macrodantin [Nitrofurantoin] Diarrhea  . Mobic [Meloxicam]     diarrhea  . Statins Other (See Comments)    Joint pain  . Sulfa Antibiotics Hives    Consultations:  Nephro, Dr. Holley Raring  Vascular surg     Procedures/Studies: DG Chest 1 View  Result Date: 04/17/2020 CLINICAL DATA:  Shortness of breath and edema EXAM: CHEST  1 VIEW COMPARISON:  May 25, 2017 FINDINGS: The heart size and mediastinal contours are unchanged with mild cardiomegaly. Aortic knob calcifications are seen. Overlying median sternotomy wires are present. There is prominence of the central pulmonary vasculature. No large airspace consolidation or pleural effusion. No acute osseous abnormality. IMPRESSION: Mild cardiomegaly and pulmonary vascular congestion Electronically Signed   By: Prudencio Pair M.D.   On: 04/17/2020 22:27   PERIPHERAL VASCULAR CATHETERIZATION  Result Date: 04/19/2020 See op note  ECHOCARDIOGRAM COMPLETE  Result Date: 04/18/2020    ECHOCARDIOGRAM REPORT   Patient Name:   DANEA MANTER [facility] Date of Exam: 04/18/2020 Medical Rec #:  [id number]      Height:       62.0 in Accession #:    [id number]     Weight:       175.0 lb Date of Birth:  June 09, 1930      BSA:          1.806 m Patient Age:    86 years       BP:           134/61 mmHg Patient Gender: F              HR:           70 bpm. Exam Location:  [facility] Procedure: 2D Echo Indications:     CHF 428.31  History:         Patient has prior history of Echocardiogram examinations, most                  recent 05/26/2017.  Sonographer:     Arville Go RDCS Referring Phys:  [id number] [place] Diagnosing Phys: Yolonda Kida MD  Sonographer Comments: No subcostal window. IMPRESSIONS  1. Left ventricular ejection fraction, by estimation, is 50 to 55%. The left ventricle has low normal function. The left ventricle has no regional wall motion abnormalities. Left ventricular diastolic parameters were normal.  2. Right ventricular systolic function is normal. The right ventricular size is normal.  3. The mitral valve is myxomatous. No evidence of mitral valve regurgitation. Mild mitral stenosis. Moderate to severe mitral annular calcification.  4. The aortic valve is grossly  normal. Aortic valve regurgitation is not visualized. FINDINGS  Left Ventricle: Left ventricular  ejection fraction, by estimation, is 50 to 55%. The left ventricle has low normal function. The left ventricle has no regional wall motion abnormalities. The left ventricular internal cavity size was normal in size. There is no left ventricular hypertrophy. Left ventricular diastolic parameters were normal. Right Ventricle: The right ventricular size is normal. No increase in right ventricular wall thickness. Right ventricular systolic function is normal. Left Atrium: Left atrial size was normal in size. Right Atrium: Right atrial size was normal in size. Pericardium: There is no evidence of pericardial effusion. Mitral Valve: The mitral valve is myxomatous. There is moderate calcification of the mitral valve leaflet(s). Mildly decreased mobility of the mitral valve leaflets. Moderate to severe mitral annular calcification. No evidence of mitral valve regurgitation. Mild mitral valve stenosis. MV peak gradient, 15.1 mmHg. The mean mitral valve gradient is 7.0 mmHg. Tricuspid Valve: The tricuspid valve is normal in structure. Tricuspid valve regurgitation is not demonstrated. Aortic Valve: The aortic valve is grossly normal. Aortic valve regurgitation is not visualized. Aortic valve mean gradient measures 8.0 mmHg. Aortic valve peak gradient measures 16.2 mmHg. Aortic valve area, by VTI measures 1.37 cm. Pulmonic Valve: The pulmonic valve was normal in structure. Pulmonic valve regurgitation is not visualized. Aorta: The ascending aorta was not well visualized. IAS/Shunts: No atrial level shunt detected by color flow Doppler.  LEFT VENTRICLE PLAX 2D LVIDd:         3.40 cm  Diastology LVIDs:         2.55 cm  LV e' medial:    4.79 cm/s LV PW:         1.61 cm  LV E/e' medial:  31.3 LV IVS:        1.33 cm  LV e' lateral:   7.72 cm/s LVOT diam:     1.90 cm  LV E/e' lateral: 19.4 LV SV:         68 LV SV Index:   38 LVOT Area:      2.84 cm  RIGHT VENTRICLE RV Basal diam:  2.74 cm RV S prime:     13.80 cm/s TAPSE (M-mode): 2.1 cm LEFT ATRIUM           Index       RIGHT ATRIUM           Index LA diam:      3.70 cm 2.05 cm/m  RA Area:     11.00 cm LA Vol (A2C): 36.4 ml 20.15 ml/m RA Volume:   21.90 ml  12.12 ml/m LA Vol (A4C): 50.0 ml 27.68 ml/m  AORTIC VALVE                    PULMONIC VALVE AV Area (Vmax):    1.32 cm     PV Vmax:       1.33 m/s AV Area (Vmean):   1.32 cm     PV Peak grad:  7.1 mmHg AV Area (VTI):     1.37 cm AV Vmax:           201.00 cm/s AV Vmean:          126.000 cm/s AV VTI:            0.496 m AV Peak Grad:      16.2 mmHg AV Mean Grad:      8.0 mmHg LVOT Vmax:         93.30 cm/s LVOT Vmean:        58.700 cm/s LVOT VTI:  0.239 m LVOT/AV VTI ratio: 0.48  AORTA Ao Root diam: 2.70 cm Ao Asc diam:  3.10 cm MITRAL VALVE                TRICUSPID VALVE MV Area (PHT): 3.99 cm     TV Peak grad:   30.0 mmHg MV Peak grad:  15.1 mmHg    TV Vmax:        2.74 m/s MV Mean grad:  7.0 mmHg MV Vmax:       1.94 m/s     SHUNTS MV Vmean:      127.0 cm/s   Systemic VTI:  0.24 m MV Decel Time: 190 msec     Systemic Diam: 1.90 cm MV E velocity: 150.00 cm/s MV A velocity: 140.00 cm/s MV E/A ratio:  1.07 Dwayne D Callwood MD Electronically signed by Yolonda Kida MD Signature Date/Time: 04/18/2020/11:49:38 AM    Final        Subjective: pt c/o fatigue    Discharge Exam: Vitals:   04/22/20 0840 04/22/20 1302  BP: (!) 176/51 (!) 163/52  Pulse: 74 73  Resp:  18  Temp: 98.7 F (37.1 C) 98.3 F (36.8 C)  SpO2: 100% 100%   Vitals:   04/21/20 2015 04/22/20 0341 04/22/20 0840 04/22/20 1302  BP: (!) 166/53 (!) 132/55 (!) 176/51 (!) 163/52  Pulse: 79 67 74 73  Resp: 17 17  18   Temp: 98.6 F (37 C) 98.3 F (36.8 C) 98.7 F (37.1 C) 98.3 F (36.8 C)  TempSrc: Oral  Oral Oral  SpO2: 98% 99% 100% 100%  Weight:  71.6 kg    Height:        General: Pt is alert, awake, not in acute distress Cardiovascular:  S1/S2 +, no rubs, no gallops Respiratory: diminished breath sounds b/l. No rales, wheezes  Abdominal: Soft, NT, obese, bowel sounds + Extremities: no cyanosis    The results of significant diagnostics from this hospitalization (including imaging, microbiology, ancillary and laboratory) are listed below for reference.     Microbiology: Recent Results (from the past 240 hour(s))  Respiratory Panel by RT PCR (Flu A&B, Covid) - Nasopharyngeal Swab     Status: None   Collection Time: 04/17/20 10:35 PM   Specimen: Nasopharyngeal Swab  Result Value Ref Range Status   SARS Coronavirus 2 by RT PCR NEGATIVE NEGATIVE Final    Comment: (NOTE) SARS-CoV-2 target nucleic acids are NOT DETECTED.  The SARS-CoV-2 RNA is generally detectable in upper respiratoy specimens during the acute phase of infection. The lowest concentration of SARS-CoV-2 viral copies this assay can detect is 131 copies/mL. A negative result does not preclude SARS-Cov-2 infection and should not be used as the sole basis for treatment or other patient management decisions. A negative result may occur with  improper specimen collection/handling, submission of specimen other than nasopharyngeal swab, presence of viral mutation(s) within the areas targeted by this assay, and inadequate number of viral copies (<131 copies/mL). A negative result must be combined with clinical observations, patient history, and epidemiological information. The expected result is Negative.  Fact Sheet for Patients:  PinkCheek.be  Fact Sheet for Healthcare Providers:  GravelBags.it  This test is no t yet approved or cleared by the [place] FDA and  has been authorized for detection and/or diagnosis of SARS-CoV-2 by FDA under an Emergency Use Authorization (EUA). This EUA will remain  in effect (meaning this test can be used) for the duration of the COVID-19 declaration under Section  564(b)(1) of the Act, 21 U.S.C. section 360bbb-3(b)(1), unless the authorization is terminated or revoked sooner.     Influenza A by PCR NEGATIVE NEGATIVE Final   Influenza B by PCR NEGATIVE NEGATIVE Final    Comment: (NOTE) The Xpert Xpress SARS-CoV-2/FLU/RSV assay is intended as an aid in  the diagnosis of influenza from Nasopharyngeal swab specimens and  should not be used as a sole basis for treatment. Nasal washings and  aspirates are unacceptable for Xpert Xpress SARS-CoV-2/FLU/RSV  testing.  Fact Sheet for Patients: PinkCheek.be  Fact Sheet for Healthcare Providers: GravelBags.it  This test is not yet approved or cleared by the [place] FDA and  has been authorized for detection and/or diagnosis of SARS-CoV-2 by  FDA under an Emergency Use Authorization (EUA). This EUA will remain  in effect (meaning this test can be used) for the duration of the  Covid-19 declaration under Section 564(b)(1) of the Act, 21  U.S.C. section 360bbb-3(b)(1), unless the authorization is  terminated or revoked. Performed at [facility], [address] [place], [place] [address]   Gastrointestinal Panel by PCR , Stool     Status: None   Collection Time: 04/18/20  9:49 AM   Specimen: Stool  Result Value Ref Range Status   Campylobacter species NOT DETECTED NOT DETECTED Final   Plesimonas shigelloides NOT DETECTED NOT DETECTED Final   Salmonella species NOT DETECTED NOT DETECTED Final   Yersinia enterocolitica NOT DETECTED NOT DETECTED Final   Vibrio species NOT DETECTED NOT DETECTED Final   Vibrio cholerae NOT DETECTED NOT DETECTED Final   Enteroaggregative E coli (EAEC) NOT DETECTED NOT DETECTED Final   Enteropathogenic E coli (EPEC) NOT DETECTED NOT DETECTED Final   Enterotoxigenic E coli (ETEC) NOT DETECTED NOT DETECTED Final   Shiga like toxin producing E coli (STEC) NOT DETECTED NOT DETECTED Final    Shigella/Enteroinvasive E coli (EIEC) NOT DETECTED NOT DETECTED Final   Cryptosporidium NOT DETECTED NOT DETECTED Final   Cyclospora cayetanensis NOT DETECTED NOT DETECTED Final   Entamoeba histolytica NOT DETECTED NOT DETECTED Final   Giardia lamblia NOT DETECTED NOT DETECTED Final   Adenovirus F40/41 NOT DETECTED NOT DETECTED Final   Astrovirus NOT DETECTED NOT DETECTED Final   Norovirus GI/GII NOT DETECTED NOT DETECTED Final   Rotavirus A NOT DETECTED NOT DETECTED Final   Sapovirus (I, II, IV, and V) NOT DETECTED NOT DETECTED Final    Comment: Performed at [facility], [address] [place], [place] [address]  Respiratory Panel by RT PCR (Flu A&B, Covid) - Nasopharyngeal Swab     Status: None   Collection Time: 04/22/20 11:43 AM   Specimen: Nasopharyngeal Swab  Result Value Ref Range Status   SARS Coronavirus 2 by RT PCR NEGATIVE NEGATIVE Final    Comment: (NOTE) SARS-CoV-2 target nucleic acids are NOT DETECTED.  The SARS-CoV-2 RNA is generally detectable in upper respiratoy specimens during the acute phase of infection. The lowest concentration of SARS-CoV-2 viral copies this assay can detect is 131 copies/mL. A negative result does not preclude SARS-Cov-2 infection and should not be used as the sole basis for treatment or other patient management decisions. A negative result may occur with  improper specimen collection/handling, submission of specimen other than nasopharyngeal swab, presence of viral mutation(s) within the areas targeted by this assay, and inadequate number of viral copies (<131 copies/mL). A negative result must be combined with clinical observations, patient history, and epidemiological information. The expected result is Negative.  Fact  Sheet for Patients:  PinkCheek.be  Fact Sheet for Healthcare Providers:  GravelBags.it  This test is no t yet approved or cleared by the [place]  FDA and  has been authorized for detection and/or diagnosis of SARS-CoV-2 by FDA under an Emergency Use Authorization (EUA). This EUA will remain  in effect (meaning this test can be used) for the duration of the COVID-19 declaration under Section 564(b)(1) of the Act, 21 U.S.C. section 360bbb-3(b)(1), unless the authorization is terminated or revoked sooner.     Influenza A by PCR NEGATIVE NEGATIVE Final   Influenza B by PCR NEGATIVE NEGATIVE Final    Comment: (NOTE) The Xpert Xpress SARS-CoV-2/FLU/RSV assay is intended as an aid in  the diagnosis of influenza from Nasopharyngeal swab specimens and  should not be used as a sole basis for treatment. Nasal washings and  aspirates are unacceptable for Xpert Xpress SARS-CoV-2/FLU/RSV  testing.  Fact Sheet for Patients: PinkCheek.be  Fact Sheet for Healthcare Providers: GravelBags.it  This test is not yet approved or cleared by the [place] FDA and  has been authorized for detection and/or diagnosis of SARS-CoV-2 by  FDA under an Emergency Use Authorization (EUA). This EUA will remain  in effect (meaning this test can be used) for the duration of the  Covid-19 declaration under Section 564(b)(1) of the Act, 21  U.S.C. section 360bbb-3(b)(1), unless the authorization is  terminated or revoked. Performed at [facility], [address] [place], [place] [address]      Labs: BNP (last 3 results) Recent Labs    04/17/20 2204  BNP 176.1*   Basic Metabolic Panel: Recent Labs  Lab 04/17/20 2204 04/17/20 2204 04/18/20 0407 04/19/20 0510 04/19/20 6073 04/20/20 0418 04/20/20 1050 04/21/20 0557 04/21/20 2145 04/22/20 0602  NA 142   < > 142 143  --  141  --  139  --  135  K 3.4*   < > 3.5 3.4*  --  2.6*  --  3.4*  --  3.0*  CL 115*   < > 117* 112*  --  107  --  104  --  96*  CO2 20*   < > 17* 24  --  25  --  27  --  27  GLUCOSE 142*   < > 98 104*  --   87  --  128*  --  185*  BUN 90*   < > 90* 81*  --  56*  --  38*  --  21  CREATININE 3.81*   < > 3.70* 3.22*  --  2.46*  --  2.31*  --  1.90*  CALCIUM 9.5   < > 8.8* 9.2  --  8.7*  --  8.9  --  9.0  MG 1.9  --   --   --   --   --   --   --   --   --   PHOS  --   --   --   --  2.4*  --  1.9* 1.5* 1.7*  --    < > = values in this interval not displayed.   Liver Function Tests: Recent Labs  Lab 04/17/20 2204 04/22/20 0602  AST 34 36  ALT 26 25  ALKPHOS 67 63  BILITOT 0.8 1.0  PROT 7.0 6.5  ALBUMIN 3.9 3.7   No results for input(s): LIPASE, AMYLASE in the last 168 hours. No results for input(s): AMMONIA in the last 168 hours. CBC: Recent  Labs  Lab 04/17/20 2204 04/19/20 0510 04/20/20 0418 04/21/20 0557 04/22/20 0602  WBC 7.6 5.4 5.1 5.9 7.3  HGB 8.8* 8.6* 7.5* 7.7* 9.1*  HCT 25.6* 24.4* 22.1* 23.0* 27.0*  MCV 94.1 93.8 94.8 96.2 96.1  PLT 99* 70* 65* 61* 67*   Cardiac Enzymes: No results for input(s): CKTOTAL, CKMB, CKMBINDEX, TROPONINI in the last 168 hours. BNP: Invalid input(s): POCBNP CBG: Recent Labs  Lab 04/19/20 1539 04/19/20 1703 04/20/20 0752 04/21/20 1438  GLUCAP 94 102* 100* 105*   D-Dimer No results for input(s): DDIMER in the last 72 hours. Hgb A1c No results for input(s): HGBA1C in the last 72 hours. Lipid Profile No results for input(s): CHOL, HDL, LDLCALC, TRIG, CHOLHDL, LDLDIRECT in the last 72 hours. Thyroid function studies No results for input(s): TSH, T4TOTAL, T3FREE, THYROIDAB in the last 72 hours.  Invalid input(s): FREET3 Anemia work up No results for input(s): VITAMINB12, FOLATE, FERRITIN, TIBC, IRON, RETICCTPCT in the last 72 hours. Urinalysis    Component Value Date/Time   BILIRUBINUR negative 02/05/2020 1531   KETONESUR negative 02/05/2020 1531   PROTEINUR negative 02/05/2020 1531   UROBILINOGEN 0.2 02/05/2020 1531   NITRITE Negative 02/05/2020 1531   LEUKOCYTESUR Small (1+) (A) 02/05/2020 1531   Sepsis Labs Invalid  input(s): PROCALCITONIN,  WBC,  LACTICIDVEN Microbiology Recent Results (from the past 240 hour(s))  Respiratory Panel by RT PCR (Flu A&B, Covid) - Nasopharyngeal Swab     Status: None   Collection Time: 04/17/20 10:35 PM   Specimen: Nasopharyngeal Swab  Result Value Ref Range Status   SARS Coronavirus 2 by RT PCR NEGATIVE NEGATIVE Final    Comment: (NOTE) SARS-CoV-2 target nucleic acids are NOT DETECTED.  The SARS-CoV-2 RNA is generally detectable in upper respiratoy specimens during the acute phase of infection. The lowest concentration of SARS-CoV-2 viral copies this assay can detect is 131 copies/mL. A negative result does not preclude SARS-Cov-2 infection and should not be used as the sole basis for treatment or other patient management decisions. A negative result may occur with  improper specimen collection/handling, submission of specimen other than nasopharyngeal swab, presence of viral mutation(s) within the areas targeted by this assay, and inadequate number of viral copies (<131 copies/mL). A negative result must be combined with clinical observations, patient history, and epidemiological information. The expected result is Negative.  Fact Sheet for Patients:  PinkCheek.be  Fact Sheet for Healthcare Providers:  GravelBags.it  This test is no t yet approved or cleared by the [place] FDA and  has been authorized for detection and/or diagnosis of SARS-CoV-2 by FDA under an Emergency Use Authorization (EUA). This EUA will remain  in effect (meaning this test can be used) for the duration of the COVID-19 declaration under Section 564(b)(1) of the Act, 21 U.S.C. section 360bbb-3(b)(1), unless the authorization is terminated or revoked sooner.     Influenza A by PCR NEGATIVE NEGATIVE Final   Influenza B by PCR NEGATIVE NEGATIVE Final    Comment: (NOTE) The Xpert Xpress SARS-CoV-2/FLU/RSV assay is intended as  an aid in  the diagnosis of influenza from Nasopharyngeal swab specimens and  should not be used as a sole basis for treatment. Nasal washings and  aspirates are unacceptable for Xpert Xpress SARS-CoV-2/FLU/RSV  testing.  Fact Sheet for Patients: PinkCheek.be  Fact Sheet for Healthcare Providers: GravelBags.it  This test is not yet approved or cleared by the [place] FDA and  has been authorized for detection and/or diagnosis of SARS-CoV-2 by  FDA under an Emergency Use Authorization (EUA). This EUA will remain  in effect (meaning this test can be used) for the duration of the  Covid-19 declaration under Section 564(b)(1) of the Act, 21  U.S.C. section 360bbb-3(b)(1), unless the authorization is  terminated or revoked. Performed at [facility], [address] [place], [place] [address]   Gastrointestinal Panel by PCR , Stool     Status: None   Collection Time: 04/18/20  9:49 AM   Specimen: Stool  Result Value Ref Range Status   Campylobacter species NOT DETECTED NOT DETECTED Final   Plesimonas shigelloides NOT DETECTED NOT DETECTED Final   Salmonella species NOT DETECTED NOT DETECTED Final   Yersinia enterocolitica NOT DETECTED NOT DETECTED Final   Vibrio species NOT DETECTED NOT DETECTED Final   Vibrio cholerae NOT DETECTED NOT DETECTED Final   Enteroaggregative E coli (EAEC) NOT DETECTED NOT DETECTED Final   Enteropathogenic E coli (EPEC) NOT DETECTED NOT DETECTED Final   Enterotoxigenic E coli (ETEC) NOT DETECTED NOT DETECTED Final   Shiga like toxin producing E coli (STEC) NOT DETECTED NOT DETECTED Final   Shigella/Enteroinvasive E coli (EIEC) NOT DETECTED NOT DETECTED Final   Cryptosporidium NOT DETECTED NOT DETECTED Final   Cyclospora cayetanensis NOT DETECTED NOT DETECTED Final   Entamoeba histolytica NOT DETECTED NOT DETECTED Final   Giardia lamblia NOT DETECTED NOT DETECTED Final   Adenovirus  F40/41 NOT DETECTED NOT DETECTED Final   Astrovirus NOT DETECTED NOT DETECTED Final   Norovirus GI/GII NOT DETECTED NOT DETECTED Final   Rotavirus A NOT DETECTED NOT DETECTED Final   Sapovirus (I, II, IV, and V) NOT DETECTED NOT DETECTED Final    Comment: Performed at [facility], [address] [place], [place] [address]  Respiratory Panel by RT PCR (Flu A&B, Covid) - Nasopharyngeal Swab     Status: None   Collection Time: 04/22/20 11:43 AM   Specimen: Nasopharyngeal Swab  Result Value Ref Range Status   SARS Coronavirus 2 by RT PCR NEGATIVE NEGATIVE Final    Comment: (NOTE) SARS-CoV-2 target nucleic acids are NOT DETECTED.  The SARS-CoV-2 RNA is generally detectable in upper respiratoy specimens during the acute phase of infection. The lowest concentration of SARS-CoV-2 viral copies this assay can detect is 131 copies/mL. A negative result does not preclude SARS-Cov-2 infection and should not be used as the sole basis for treatment or other patient management decisions. A negative result may occur with  improper specimen collection/handling, submission of specimen other than nasopharyngeal swab, presence of viral mutation(s) within the areas targeted by this assay, and inadequate number of viral copies (<131 copies/mL). A negative result must be combined with clinical observations, patient history, and epidemiological information. The expected result is Negative.  Fact Sheet for Patients:  PinkCheek.be  Fact Sheet for Healthcare Providers:  GravelBags.it  This test is no t yet approved or cleared by the [place] FDA and  has been authorized for detection and/or diagnosis of SARS-CoV-2 by FDA under an Emergency Use Authorization (EUA). This EUA will remain  in effect (meaning this test can be used) for the duration of the COVID-19 declaration under Section 564(b)(1) of the Act, 21 U.S.C. section  360bbb-3(b)(1), unless the authorization is terminated or revoked sooner.     Influenza A by PCR NEGATIVE NEGATIVE Final   Influenza B by PCR NEGATIVE NEGATIVE Final    Comment: (NOTE) The Xpert Xpress SARS-CoV-2/FLU/RSV assay is intended as an aid in  the diagnosis of  influenza from Nasopharyngeal swab specimens and  should not be used as a sole basis for treatment. Nasal washings and  aspirates are unacceptable for Xpert Xpress SARS-CoV-2/FLU/RSV  testing.  Fact Sheet for Patients: PinkCheek.be  Fact Sheet for Healthcare Providers: GravelBags.it  This test is not yet approved or cleared by the [place] FDA and  has been authorized for detection and/or diagnosis of SARS-CoV-2 by  FDA under an Emergency Use Authorization (EUA). This EUA will remain  in effect (meaning this test can be used) for the duration of the  Covid-19 declaration under Section 564(b)(1) of the Act, 21  U.S.C. section 360bbb-3(b)(1), unless the authorization is  terminated or revoked. Performed at [facility], [address], [place], [place] [address]      Time coordinating discharge: Over 30 minutes  SIGNED:   Wyvonnia Dusky, MD  [facility] 04/22/2020, 1:08 PM Pager   If 7PM-7AM, please contact night-coverage [email or web address]

## 2020-04-28 ENCOUNTER — Ambulatory Visit: Payer: PRIVATE HEALTH INSURANCE | Admitting: Family

## 2020-05-01 ENCOUNTER — Inpatient Hospital Stay
Admission: EM | Admit: 2020-05-01 | Discharge: 2020-05-17 | DRG: 388 | Disposition: E | Payer: Medicare Other | Attending: Family Medicine | Admitting: Family Medicine

## 2020-05-01 ENCOUNTER — Inpatient Hospital Stay: Payer: Medicare Other

## 2020-05-01 ENCOUNTER — Other Ambulatory Visit: Payer: Self-pay

## 2020-05-01 ENCOUNTER — Emergency Department: Payer: Medicare Other

## 2020-05-01 DIAGNOSIS — R0603 Acute respiratory distress: Secondary | ICD-10-CM | POA: Diagnosis present

## 2020-05-01 DIAGNOSIS — Z79890 Hormone replacement therapy (postmenopausal): Secondary | ICD-10-CM

## 2020-05-01 DIAGNOSIS — Z888 Allergy status to other drugs, medicaments and biological substances status: Secondary | ICD-10-CM

## 2020-05-01 DIAGNOSIS — K766 Portal hypertension: Secondary | ICD-10-CM | POA: Diagnosis present

## 2020-05-01 DIAGNOSIS — I5032 Chronic diastolic (congestive) heart failure: Secondary | ICD-10-CM | POA: Diagnosis present

## 2020-05-01 DIAGNOSIS — E1122 Type 2 diabetes mellitus with diabetic chronic kidney disease: Secondary | ICD-10-CM | POA: Diagnosis present

## 2020-05-01 DIAGNOSIS — Z951 Presence of aortocoronary bypass graft: Secondary | ICD-10-CM

## 2020-05-01 DIAGNOSIS — D469 Myelodysplastic syndrome, unspecified: Secondary | ICD-10-CM | POA: Diagnosis present

## 2020-05-01 DIAGNOSIS — Z66 Do not resuscitate: Secondary | ICD-10-CM | POA: Diagnosis present

## 2020-05-01 DIAGNOSIS — Z6830 Body mass index (BMI) 30.0-30.9, adult: Secondary | ICD-10-CM

## 2020-05-01 DIAGNOSIS — K7460 Unspecified cirrhosis of liver: Secondary | ICD-10-CM | POA: Diagnosis present

## 2020-05-01 DIAGNOSIS — I700 Atherosclerosis of aorta: Secondary | ICD-10-CM | POA: Diagnosis present

## 2020-05-01 DIAGNOSIS — I2510 Atherosclerotic heart disease of native coronary artery without angina pectoris: Secondary | ICD-10-CM | POA: Diagnosis present

## 2020-05-01 DIAGNOSIS — R161 Splenomegaly, not elsewhere classified: Secondary | ICD-10-CM | POA: Diagnosis present

## 2020-05-01 DIAGNOSIS — D631 Anemia in chronic kidney disease: Secondary | ICD-10-CM | POA: Diagnosis present

## 2020-05-01 DIAGNOSIS — Z20822 Contact with and (suspected) exposure to covid-19: Secondary | ICD-10-CM | POA: Diagnosis present

## 2020-05-01 DIAGNOSIS — Z881 Allergy status to other antibiotic agents status: Secondary | ICD-10-CM

## 2020-05-01 DIAGNOSIS — N186 End stage renal disease: Secondary | ICD-10-CM

## 2020-05-01 DIAGNOSIS — G2581 Restless legs syndrome: Secondary | ICD-10-CM | POA: Diagnosis present

## 2020-05-01 DIAGNOSIS — I480 Paroxysmal atrial fibrillation: Secondary | ICD-10-CM | POA: Diagnosis not present

## 2020-05-01 DIAGNOSIS — E860 Dehydration: Secondary | ICD-10-CM | POA: Diagnosis present

## 2020-05-01 DIAGNOSIS — I4891 Unspecified atrial fibrillation: Secondary | ICD-10-CM | POA: Diagnosis present

## 2020-05-01 DIAGNOSIS — R4182 Altered mental status, unspecified: Secondary | ICD-10-CM

## 2020-05-01 DIAGNOSIS — Z992 Dependence on renal dialysis: Secondary | ICD-10-CM | POA: Diagnosis not present

## 2020-05-01 DIAGNOSIS — K56609 Unspecified intestinal obstruction, unspecified as to partial versus complete obstruction: Secondary | ICD-10-CM

## 2020-05-01 DIAGNOSIS — K56600 Partial intestinal obstruction, unspecified as to cause: Secondary | ICD-10-CM | POA: Diagnosis present

## 2020-05-01 DIAGNOSIS — E669 Obesity, unspecified: Secondary | ICD-10-CM | POA: Diagnosis present

## 2020-05-01 DIAGNOSIS — Z515 Encounter for palliative care: Secondary | ICD-10-CM

## 2020-05-01 DIAGNOSIS — G9341 Metabolic encephalopathy: Secondary | ICD-10-CM | POA: Diagnosis present

## 2020-05-01 DIAGNOSIS — R579 Shock, unspecified: Secondary | ICD-10-CM | POA: Diagnosis not present

## 2020-05-01 DIAGNOSIS — E785 Hyperlipidemia, unspecified: Secondary | ICD-10-CM | POA: Diagnosis present

## 2020-05-01 DIAGNOSIS — Z882 Allergy status to sulfonamides status: Secondary | ICD-10-CM

## 2020-05-01 DIAGNOSIS — E1121 Type 2 diabetes mellitus with diabetic nephropathy: Secondary | ICD-10-CM | POA: Diagnosis present

## 2020-05-01 DIAGNOSIS — D696 Thrombocytopenia, unspecified: Secondary | ICD-10-CM | POA: Diagnosis present

## 2020-05-01 DIAGNOSIS — Z79899 Other long term (current) drug therapy: Secondary | ICD-10-CM

## 2020-05-01 DIAGNOSIS — Z794 Long term (current) use of insulin: Secondary | ICD-10-CM | POA: Diagnosis not present

## 2020-05-01 DIAGNOSIS — I10 Essential (primary) hypertension: Secondary | ICD-10-CM | POA: Diagnosis not present

## 2020-05-01 DIAGNOSIS — E11319 Type 2 diabetes mellitus with unspecified diabetic retinopathy without macular edema: Secondary | ICD-10-CM | POA: Diagnosis present

## 2020-05-01 DIAGNOSIS — E1142 Type 2 diabetes mellitus with diabetic polyneuropathy: Secondary | ICD-10-CM | POA: Diagnosis present

## 2020-05-01 DIAGNOSIS — K559 Vascular disorder of intestine, unspecified: Secondary | ICD-10-CM | POA: Diagnosis present

## 2020-05-01 DIAGNOSIS — Z87891 Personal history of nicotine dependence: Secondary | ICD-10-CM

## 2020-05-01 DIAGNOSIS — E119 Type 2 diabetes mellitus without complications: Secondary | ICD-10-CM | POA: Diagnosis not present

## 2020-05-01 DIAGNOSIS — I132 Hypertensive heart and chronic kidney disease with heart failure and with stage 5 chronic kidney disease, or end stage renal disease: Secondary | ICD-10-CM | POA: Diagnosis present

## 2020-05-01 DIAGNOSIS — J90 Pleural effusion, not elsewhere classified: Secondary | ICD-10-CM | POA: Diagnosis present

## 2020-05-01 DIAGNOSIS — Z7189 Other specified counseling: Secondary | ICD-10-CM | POA: Diagnosis not present

## 2020-05-01 DIAGNOSIS — E113299 Type 2 diabetes mellitus with mild nonproliferative diabetic retinopathy without macular edema, unspecified eye: Secondary | ICD-10-CM | POA: Diagnosis present

## 2020-05-01 DIAGNOSIS — D649 Anemia, unspecified: Secondary | ICD-10-CM | POA: Diagnosis present

## 2020-05-01 DIAGNOSIS — Z9049 Acquired absence of other specified parts of digestive tract: Secondary | ICD-10-CM

## 2020-05-01 DIAGNOSIS — Z79891 Long term (current) use of opiate analgesic: Secondary | ICD-10-CM

## 2020-05-01 DIAGNOSIS — Z833 Family history of diabetes mellitus: Secondary | ICD-10-CM

## 2020-05-01 DIAGNOSIS — K219 Gastro-esophageal reflux disease without esophagitis: Secondary | ICD-10-CM | POA: Diagnosis present

## 2020-05-01 DIAGNOSIS — Z90710 Acquired absence of both cervix and uterus: Secondary | ICD-10-CM

## 2020-05-01 LAB — CBC WITH DIFFERENTIAL/PLATELET
Abs Immature Granulocytes: 0.14 10*3/uL — ABNORMAL HIGH (ref 0.00–0.07)
Basophils Absolute: 0.1 10*3/uL (ref 0.0–0.1)
Basophils Relative: 0 %
Eosinophils Absolute: 0.1 10*3/uL (ref 0.0–0.5)
Eosinophils Relative: 0 %
HCT: 32.3 % — ABNORMAL LOW (ref 36.0–46.0)
Hemoglobin: 10.3 g/dL — ABNORMAL LOW (ref 12.0–15.0)
Immature Granulocytes: 1 %
Lymphocytes Relative: 10 %
Lymphs Abs: 1.4 10*3/uL (ref 0.7–4.0)
MCH: 31.2 pg (ref 26.0–34.0)
MCHC: 31.9 g/dL (ref 30.0–36.0)
MCV: 97.9 fL (ref 80.0–100.0)
Monocytes Absolute: 1.5 10*3/uL — ABNORMAL HIGH (ref 0.1–1.0)
Monocytes Relative: 11 %
Neutro Abs: 10.6 10*3/uL — ABNORMAL HIGH (ref 1.7–7.7)
Neutrophils Relative %: 78 %
Platelets: 145 10*3/uL — ABNORMAL LOW (ref 150–400)
RBC: 3.3 MIL/uL — ABNORMAL LOW (ref 3.87–5.11)
RDW: 15.9 % — ABNORMAL HIGH (ref 11.5–15.5)
Smear Review: NORMAL
WBC: 13.8 10*3/uL — ABNORMAL HIGH (ref 4.0–10.5)
nRBC: 0 % (ref 0.0–0.2)

## 2020-05-01 LAB — RESPIRATORY PANEL BY RT PCR (FLU A&B, COVID)
Influenza A by PCR: NEGATIVE
Influenza B by PCR: NEGATIVE
SARS Coronavirus 2 by RT PCR: NEGATIVE

## 2020-05-01 LAB — MAGNESIUM: Magnesium: 2.3 mg/dL (ref 1.7–2.4)

## 2020-05-01 LAB — COMPREHENSIVE METABOLIC PANEL
ALT: 18 U/L (ref 0–44)
AST: 19 U/L (ref 15–41)
Albumin: 3 g/dL — ABNORMAL LOW (ref 3.5–5.0)
Alkaline Phosphatase: 91 U/L (ref 38–126)
Anion gap: 17 — ABNORMAL HIGH (ref 5–15)
BUN: 31 mg/dL — ABNORMAL HIGH (ref 8–23)
CO2: 25 mmol/L (ref 22–32)
Calcium: 8.8 mg/dL — ABNORMAL LOW (ref 8.9–10.3)
Chloride: 93 mmol/L — ABNORMAL LOW (ref 98–111)
Creatinine, Ser: 3.37 mg/dL — ABNORMAL HIGH (ref 0.44–1.00)
GFR, Estimated: 12 mL/min — ABNORMAL LOW (ref >60)
Glucose, Bld: 196 mg/dL — ABNORMAL HIGH (ref 70–99)
Potassium: 3.4 mmol/L — ABNORMAL LOW (ref 3.5–5.1)
Sodium: 135 mmol/L (ref 135–145)
Total Bilirubin: 1.3 mg/dL — ABNORMAL HIGH (ref 0.3–1.2)
Total Protein: 6.2 g/dL — ABNORMAL LOW (ref 6.5–8.1)

## 2020-05-01 LAB — GLUCOSE, CAPILLARY
Glucose-Capillary: 186 mg/dL — ABNORMAL HIGH (ref 70–99)
Glucose-Capillary: 180 mg/dL — ABNORMAL HIGH (ref 70–99)

## 2020-05-01 LAB — LIPASE, BLOOD: Lipase: 24 U/L (ref 11–51)

## 2020-05-01 MED ORDER — CHLORHEXIDINE GLUCONATE CLOTH 2 % EX PADS: 6.0000 | MEDICATED_PAD | Freq: Every day | CUTANEOUS | Status: DC

## 2020-05-01 MED ORDER — INSULIN ASPART 100 UNIT/ML ~~LOC~~ SOLN
0.0000 [IU] | SUBCUTANEOUS | Status: DC
  Administered 2020-05-01 – 2020-05-02 (×5): 1 [IU] via SUBCUTANEOUS
  Filled 2020-05-01 (×5): qty 1

## 2020-05-01 MED ORDER — HYDRALAZINE HCL 20 MG/ML IJ SOLN: 10.0000 mg | Freq: Four times a day (QID) | INTRAMUSCULAR | Status: DC | PRN

## 2020-05-01 MED ORDER — HEPARIN SODIUM (PORCINE) 5000 UNIT/ML IJ SOLN
5000.0000 [IU] | Freq: Three times a day (TID) | INTRAMUSCULAR | Status: DC
  Administered 2020-05-01 – 2020-05-02 (×3): 5000 [IU] via SUBCUTANEOUS
  Filled 2020-05-01 (×3): qty 1

## 2020-05-01 MED ORDER — MORPHINE SULFATE (PF) 4 MG/ML IV SOLN
4.0000 mg | Freq: Once | INTRAVENOUS | Status: AC
  Administered 2020-05-01: 4 mg via INTRAVENOUS
  Filled 2020-05-01: qty 1

## 2020-05-01 MED ORDER — PROCHLORPERAZINE EDISYLATE 10 MG/2ML IJ SOLN
10.0000 mg | Freq: Once | INTRAMUSCULAR | Status: AC
  Administered 2020-05-01: 10 mg via INTRAVENOUS
  Filled 2020-05-01: qty 2

## 2020-05-01 MED ORDER — ONDANSETRON HCL 4 MG/2ML IJ SOLN: 4.0000 mg | Freq: Four times a day (QID) | INTRAMUSCULAR | Status: DC | PRN

## 2020-05-01 MED ORDER — FLUTICASONE PROPIONATE 50 MCG/ACT NA SUSP
2.0000 | Freq: Every day | NASAL | Status: DC
  Filled 2020-05-01: qty 16

## 2020-05-01 MED ORDER — DEXTROSE-NACL 5-0.9 % IV SOLN: INTRAVENOUS | Status: DC

## 2020-05-01 MED ORDER — ONDANSETRON HCL 4 MG/2ML IJ SOLN
4.0000 mg | Freq: Once | INTRAMUSCULAR | Status: AC
  Administered 2020-05-01: 4 mg via INTRAVENOUS
  Filled 2020-05-01: qty 2

## 2020-05-01 MED ORDER — LACTATED RINGERS IV BOLUS
250.0000 mL | Freq: Once | INTRAVENOUS | Status: AC
  Administered 2020-05-01: 250 mL via INTRAVENOUS

## 2020-05-01 MED ORDER — ONDANSETRON HCL 4 MG PO TABS: 4.0000 mg | ORAL_TABLET | Freq: Four times a day (QID) | ORAL | Status: DC | PRN

## 2020-05-01 MED ORDER — HYDROMORPHONE HCL 1 MG/ML IJ SOLN: 0.5000 mg | INTRAMUSCULAR | Status: DC | PRN

## 2020-05-01 MED ORDER — TRIAMCINOLONE ACETONIDE 55 MCG/ACT NA AERO: 2.0000 | INHALATION_SPRAY | Freq: Every day | NASAL | Status: DC

## 2020-05-01 MED ORDER — ALBUTEROL SULFATE (2.5 MG/3ML) 0.083% IN NEBU: 2.5000 mg | INHALATION_SOLUTION | Freq: Four times a day (QID) | RESPIRATORY_TRACT | Status: DC | PRN

## 2020-05-01 MED ORDER — PANTOPRAZOLE SODIUM 40 MG IV SOLR
40.0000 mg | INTRAVENOUS | Status: DC
  Administered 2020-05-01: 40 mg via INTRAVENOUS
  Filled 2020-05-01: qty 40

## 2020-05-01 NOTE — ED Triage Notes (Signed)
Pt presents via EMS from [place] c/o N/V/D x1 week after starting dialysis. Pt A&Ox4.

## 2020-05-01 NOTE — Progress Notes (Addendum)
Pt was admitted on the floor with no signs of distress. Pt was lethargic on assessment. All assessment screening admission was obatined through [place] pt daughter. VSS except BP 102/42 MAP 58. HR 89. Pt has a continuous D5 0.9 % sodium chloirde running at 30 ml/hr. Pt has an NG tube hook up on low intermittent suction on assessment. Will continue to monitor.

## 2020-05-01 NOTE — ED Notes (Signed)
Floor unable to take pt at this time. Mickel Baas charge RN made aware. Will attempt to call report at apx [phone].

## 2020-05-01 NOTE — ED Provider Notes (Signed)
[facility] Emergency Department Provider Note   ____________________________________________   First MD Initiated Contact with Patient 04/24/2020 1132     (approximate)  I have reviewed the triage vital signs and the nursing notes.   HISTORY  Chief Complaint Abdominal Pain   HPI Katherine Hernandez is a 86 y.o. female with past medical history of ESRD on HD (MWF), hypertension, hyperlipidemia, diabetes, CAD, and diastolic CHF who presents to the ED complaining of abdominal pain and vomiting.  Patient was recently admitted to the hospital and started on hemodialysis due to worsening renal function.  She was discharged to skilled nursing facility 9 days ago and states she has been dealing with pain in her stomach ever since then.  Per EMS, she has been feeling nauseous with vomiting and diarrhea as well.  She reports feeling much weaker than usual but denies any fevers, cough, chest pain, or shortness of breath.  She still makes a small amount of urine and denies any dysuria or hematuria.  She states her abdomen feels much more swollen than usual, denies any history of liver problems.        Past Medical History:  Diagnosis Date   Arthritis    CAD (coronary artery disease)    Last nuclear 02/2009 EF 72% and neg for ischemia    CHF (congestive heart failure) (HCC)    CKD (chronic kidney disease) 03/17/2020   Coronary artery disease    CAD with PTCA(cfx)fx -1993,and CABG ,2004   Diabetes mellitus without complication (HCC)    Diabetic nephropathy (HCC)    Diabetic peripheral neuropathy (HCC)    GERD (gastroesophageal reflux disease)    Hyperlipidemia    Hypertension    Obesity    Retinopathy, diabetic, background ([place])    RLS (restless legs syndrome)    Thrombocytopenia (HCC)    Urinary incontinence     Patient Active Problem List   Diagnosis Date Noted   ESRD (end stage renal disease) ([place])    Acute CHF (congestive heart failure) ([place])  04/17/2020   CKD (chronic kidney disease) 03/17/2020   MDS (myelodysplastic syndrome) ([place]) 12/03/2017   Normocytic anemia 12/03/2017   Thrombocytopenia ([place]) 12/03/2017   HTN (hypertension) 06/07/2017   Atrial fibrillation ([place]) 06/07/2017   Diabetes ([place]) 06/07/2017   Lymphedema 06/07/2017   CHF (congestive heart failure) ([place]) 05/25/2017    Past Surgical History:  Procedure Laterality Date   ABDOMINAL HYSTERECTOMY     BONE MARROW BIOPSY     BREAST BIOPSY Left 1970   surgical exc   BREAST BIOPSY Right 09/13/2016   path pending x 2 areas   CHOLECYSTECTOMY  1968   COLONOSCOPY WITH PROPOFOL N/A 01/02/2017   Procedure: COLONOSCOPY WITH PROPOFOL;  Surgeon: Lollie Sails, MD;  Location: [facility] ENDOSCOPY;  Service: Endoscopy;  Laterality: N/A;   CORONARY ANGIOPLASTY     CORONARY ARTERY BYPASS GRAFT  02/2003   DIALYSIS/PERMA CATHETER INSERTION N/A 04/19/2020   Procedure: DIALYSIS/PERMA CATHETER INSERTION;  Surgeon: Algernon Huxley, MD;  Location: [facility] CV LAB;  Service: Cardiovascular;  Laterality: N/A;   ery     ESOPHAGOGASTRODUODENOSCOPY (EGD) WITH PROPOFOL N/A 01/02/2017   Procedure: ESOPHAGOGASTRODUODENOSCOPY (EGD) WITH PROPOFOL;  Surgeon: Lollie Sails, MD;  Location: [facility] ENDOSCOPY;  Service: Endoscopy;  Laterality: N/A;    Prior to Admission medications   Medication Sig Start Date End Date Taking? Authorizing Provider  acetaminophen (TYLENOL 8 HOUR ARTHRITIS PAIN) 650 MG CR tablet Take 1,300 mg by mouth  in the morning.   Yes [provider]  acidophilus (RISAQUAD) CAPS capsule Take 1 capsule by mouth 2 (two) times daily with a meal. Breakfast and lunch   Yes [provider]  allopurinol (ZYLOPRIM) 100 MG tablet Take 200 mg by mouth daily.  08/06/17  Yes [provider]  amLODipine (NORVASC) 10 MG tablet Take 10 mg by mouth daily.  02/26/20  Yes [provider]  cetirizine (ZYRTEC) 10 MG tablet Take 10 mg by mouth  daily.   Yes [provider]  conjugated estrogens (PREMARIN) vaginal cream INSERT 0.5 GRAMS VAGINALLY  TWICE WEEKLY 10/15/19  Yes [provider]  Cyanocobalamin (B-12 COMPLIANCE INJECTION IJ) Inject as directed every 30 (thirty) days.   Yes [provider]  dextrose (GLUTOSE) 40 % GEL Take 1 Tube by mouth once as needed for low blood sugar.   Yes [provider]  dicyclomine (BENTYL) 10 MG capsule Take 10 mg by mouth 4 (four) times daily as needed. 03/17/20  Yes [provider]  ergocalciferol (VITAMIN D2) 50000 units capsule Take 50,000 Units by mouth once a week.   Yes [provider]  furosemide (LASIX) 80 MG tablet Take 80 mg by mouth daily.   Yes [provider]  gabapentin (NEURONTIN) 300 MG capsule Take 300 mg by mouth 2 (two) times daily. Pt takes 1 capsule in the morning and 2 capsules at bedtime.   Yes [provider]  Infant Care Products (DERMACLOUD) CREA Apply 1 application topically daily as needed (skin irritation).   Yes [provider]  iron polysaccharides (NIFEREX) 150 MG capsule Take 150 mg by mouth daily.  10/13/19  Yes [provider]  loperamide (IMODIUM) 2 MG capsule Take 4 mg by mouth as needed for diarrhea or loose stools.   Yes [provider]  LORazepam (ATIVAN) 0.5 MG tablet Take 0.5 mg by mouth daily as needed for anxiety.    Yes [provider]  losartan (COZAAR) 100 MG tablet Take 100 mg by mouth daily.   Yes [provider]  metoprolol tartrate (LOPRESSOR) 25 MG tablet Take 25 mg by mouth 2 (two) times daily.   Yes [provider]  ondansetron (ZOFRAN) 4 MG tablet Take 4 mg by mouth every 6 (six) hours as needed for nausea or vomiting.   Yes [provider]  pantoprazole (PROTONIX) 40 MG tablet Take 40 mg by mouth daily.    Yes [provider]  rOPINIRole (REQUIP) 0.5 MG tablet Take 0.5 mg by mouth at bedtime.   Yes [provider]  sertraline (ZOLOFT) 50 MG tablet Take 50 mg by mouth daily.  10/23/17 05/05/2020 Yes [provider]  sucralfate (CARAFATE) 1 g tablet Take 1 g by mouth 2 (two) times daily. 11/24/19  Yes [provider]  traMADol (ULTRAM) 50 MG tablet Take 50 mg by mouth every 12 (twelve) hours as needed.  01/09/18  Yes [provider]  triamcinolone (NASACORT) 55 MCG/ACT AERO nasal inhaler 2 sprays daily. 11/24/19  Yes [provider]  albuterol (PROVENTIL HFA;VENTOLIN HFA) 108 (90 Base) MCG/ACT inhaler Inhale 2 puffs every 6 (six) hours as needed into the lungs for wheezing or shortness of breath.    [provider]  Insulin Glargine (LANTUS SOLOSTAR) 100 UNIT/ML Solostar Pen Inject 48 Units into the skin daily.     [provider]  liraglutide (VICTOZA) 18 MG/3ML SOPN Inject 1.8 mg into the skin daily.     [provider]  nitroGLYCERIN (NITROSTAT) 0.4 MG SL tablet Place 0.4 mg under the tongue every 5 (five) minutes as needed for chest pain.     [provider]    Allergies Amoxicillin, Cefdinir, Doxycycline, Eliquis [apixaban], Erythromycin, Lisinopril, Macrodantin [nitrofurantoin], Mobic [meloxicam], Statins, and Sulfa antibiotics  Family History  Problem Relation Age of Onset   Breast cancer Sister 26   Breast cancer Sister    Diabetes Mother    Heart disease Mother    Kidney disease Father    Hypertension Father    [place]coni anemia Sister     Social History Social History   Tobacco Use   Smoking status: Former Smoker    Packs/day: 1.00    Years: 15.00    Pack years: 15.00    Types: Cigarettes    Quit date: 07/17/1978    Years since quitting: 41.8   Smokeless tobacco: Never Used   Tobacco comment: quit in year 1980  Vaping Use   Vaping Use: Never used  Substance Use Topics   Alcohol use: No   Drug use: No    Review of Systems  Constitutional: No fever/chills.  Positive for generalized  weakness. Eyes: No visual changes. ENT: No sore throat. Cardiovascular: Denies chest pain. Respiratory: Denies shortness of breath. Gastrointestinal: Positive for abdominal pain, nausea, vomiting, and diarrhea.  No constipation. Genitourinary: Negative for dysuria. Musculoskeletal: Negative for back pain. Skin: Negative for rash. Neurological: Negative for headaches, focal weakness or numbness.  ____________________________________________   PHYSICAL EXAM:  VITAL SIGNS: ED Triage Vitals  Enc Vitals Group     BP 04/19/2020 1133 (!) 126/57     Pulse Rate 05/12/2020 1133 85     Resp 04/22/2020 1133 (!) 25     Temp 05/10/2020 1133 98.5 F (36.9 C)     Temp Source 04/30/2020 1133 Oral     SpO2 04/21/2020 1133 96 %     Weight --      Height --      Head Circumference --      Peak Flow --      Pain Score 04/26/2020 1134 7     Pain Loc --      Pain Edu? --      Excl. in [place]? --     Constitutional: Alert and oriented. Eyes: Conjunctivae are normal. Head: Atraumatic. Nose: No congestion/rhinnorhea. Mouth/Throat: Mucous membranes are dry. Neck: Normal ROM Cardiovascular: Normal rate, regular rhythm. Grossly normal heart sounds.  Right IJ TDC site clean, dry, and intact. Respiratory: Normal respiratory effort.  No retractions. Lungs CTAB. Gastrointestinal: Soft and diffusely tender to palpation with no rebound or guarding.  Moderately distended. Genitourinary: deferred Musculoskeletal: No lower extremity tenderness nor edema. Neurologic:  Normal speech and language. No gross focal neurologic deficits are appreciated. Skin:  Skin is warm, dry and intact. No rash noted. Psychiatric: Mood and affect are normal. Speech and behavior are normal.  ____________________________________________   LABS (all labs ordered are listed, but only abnormal results are displayed)  Labs Reviewed  CBC WITH DIFFERENTIAL/PLATELET - Abnormal; Notable for the following components:      Result Value   WBC 13.8  (*)    RBC 3.30 (*)    Hemoglobin 10.3 (*)    HCT 32.3 (*)    RDW 15.9 (*)    Platelets 145 (*)    Neutro Abs 10.6 (*)    Monocytes Absolute 1.5 (*)    Abs Immature Granulocytes 0.14 (*)    All other components within  normal limits  COMPREHENSIVE METABOLIC PANEL - Abnormal; Notable for the following components:   Potassium 3.4 (*)    Chloride 93 (*)    Glucose, Bld 196 (*)    BUN 31 (*)    Creatinine, Ser 3.37 (*)    Calcium 8.8 (*)    Total Protein 6.2 (*)    Albumin 3.0 (*)    Total Bilirubin 1.3 (*)    GFR, Estimated 12 (*)    Anion gap 17 (*)    All other components within normal limits  RESPIRATORY PANEL BY RT PCR (FLU A&B, COVID)  LIPASE, BLOOD  MAGNESIUM  URINALYSIS, COMPLETE (UACMP) WITH MICROSCOPIC   ____________________________________________  EKG  ED ECG REPORT I, Blake Divine, the attending physician, personally viewed and interpreted this ECG.   Date: 05/15/2020  EKG Time: 11:33  Rate: 84  Rhythm: normal sinus rhythm  Axis: LAD  Intervals:Prolonged QT  ST&T Change: T wave inversions laterally   PROCEDURES  Procedure(s) performed (including Critical Care):  Procedures   ____________________________________________   INITIAL IMPRESSION / ASSESSMENT AND PLAN / ED COURSE       86 year old female with history of hypertension, hyperlipidemia, diabetes, CAD, diastolic CHF, and ESRD recently started on hemodialysis presents to the ED complaining of diffuse abdominal pain, vomiting, and diarrhea.  She appears dehydrated and we will cautiously hydrate with IV fluids, treat pain and nausea with IV morphine and Zofran.  She appears distended and we will perform CT scan to assess for bowel obstruction versus new ascites versus other intra-abdominal process.  EKG is similar to previous with prolonged QT and will need to be cautious with antiemetics.  Findings also concerning for electrolyte abnormality, will check CBC, CMP, lipase, and magnesium.  Lab  work is reassuring, electrolytes unremarkable and consistent with her ESRD.  CT scan does have findings concerning for partial SBO and patient continues to have significant discomfort along with difficulty tolerating p.o.  Given this, case was discussed with hospitalist for admission.      ____________________________________________   FINAL CLINICAL IMPRESSION(S) / ED DIAGNOSES  Final diagnoses:  Partial intestinal obstruction, unspecified cause ([place])  ESRD on dialysis [facility])     ED Discharge Orders    None       Note:  This document was prepared using Dragon voice recognition software and may include unintentional dictation errors.   Blake Divine, MD 04/18/2020 1323

## 2020-05-01 NOTE — H&P (Addendum)
History and Physical    Katherine Hernandez BMW:[id number] DOB: [birth year] DOA: 04/26/2020  PCP: Tracie Harrier, MD   Patient coming from: SNF  I have personally briefly reviewed patient's old medical records in [place]  Chief Complaint: Nausea/Vomitting/Diarrhea Most of the history was obtained from her daughter Maylon Peppers  at the bedside.  Patient is lethargic and unable to participate in her history and physical  HPI: GEORGE ALCANTAR is a 86 y.o. female with medical history significant for diabetes mellitus with complications of ESRD on HD (M/W/F), history of hypertension, coronary artery disease, chronic diastolic dysfunction CHF who was brought into the emergency room for evaluation of abdominal pain and vomiting.  Patient was recently discharged from the hospital and was started on renal replacement therapy during that hospitalization for worsening renal function and congestive heart failure.  She was discharged to a skilled nursing facility 9 days prior to this admission and stated that she has had abdominal pain since then.  Abdominal pain is mostly periumbilical and associated with nausea, vomiting and diarrhea.  She reports feeling much weaker than usual but denies having any falls.  She had having a replacement therapy on Friday, 1 day prior to her admission. She continues to make small amounts of urine  I am unable to do a review of systems due to patient's altered mental status Labs show sodium 135, potassium 3.4, chloride 93, bicarb 25, BUN 31, creatinine 3.37, anion gap 17, magnesium 2.3, alkaline phosphatase 91, albumin 3.0, lipase 24, AST 19, ALT 18, total protein 6.2, white count 13.8, hemoglobin 10.3, hematocrit 32.3, MCV 97.9, RDW 15.9, platelet count 145 Respiratory viral panel is negative CT scan of abdomen and pelvis showed dilated proximal small bowel transition to normal caliber small bowel in the mid abdomen.  The transition is somewhat gradual, but a partial  small bowel obstruction is still suspected as the etiology.  No bowel wall thickening or inflammation.  Advanced cirrhosis.  A component of portal venous hypertension is suggested by mild splenomegaly.  Significant bilateral renal cortical thinning consistent with medical renal disease. Chest x-ray reviewed by me shows no acute findings, possible trace left pleural effusion. Twelve-lead EKG shows sinus rhythm with left anterior fascicular block.   ED Course: Patient is an 86 year old female who presents to the emergency room for evaluation of abdominal pain, nausea, vomiting and diarrhea.  She has had symptoms for over a week.  She presents to the ER due to persistence of her symptoms and is found to have a partial small bowel obstruction.  Patient will be admitted to the hospital for further evaluation.  Review of Systems: As per HPI otherwise 10 point review of systems negative.    Past Medical History:  Diagnosis Date  . Arthritis   . CAD (coronary artery disease)    Last nuclear 02/2009 EF 72% and neg for ischemia   . CHF (congestive heart failure) ([place])   . CKD (chronic kidney disease) 03/17/2020  . Coronary artery disease    CAD with PTCA(cfx)fx -1993,and CABG ,2004  . Diabetes mellitus without complication ([place])   . Diabetic nephropathy ([place])   . Diabetic peripheral neuropathy ([place])   . GERD (gastroesophageal reflux disease)   . Hyperlipidemia   . Hypertension   . Obesity   . Retinopathy, diabetic, background ([place])   . RLS (restless legs syndrome)   . Thrombocytopenia ([place])   . Urinary incontinence     Past Surgical History:  Procedure Laterality Date  .  ABDOMINAL HYSTERECTOMY    . BONE MARROW BIOPSY    . BREAST BIOPSY Left 1970   surgical exc  . BREAST BIOPSY Right 09/13/2016   path pending x 2 areas  . CHOLECYSTECTOMY  1968  . COLONOSCOPY WITH PROPOFOL N/A 01/02/2017   Procedure: COLONOSCOPY WITH PROPOFOL;  Surgeon: Lollie Sails, MD;  Location: [facility] ENDOSCOPY;   Service: Endoscopy;  Laterality: N/A;  . CORONARY ANGIOPLASTY    . CORONARY ARTERY BYPASS GRAFT  02/2003  . DIALYSIS/PERMA CATHETER INSERTION N/A 04/19/2020   Procedure: DIALYSIS/PERMA CATHETER INSERTION;  Surgeon: Algernon Huxley, MD;  Location: [facility] LAB;  Service: Cardiovascular;  Laterality: N/A;  . ery    . ESOPHAGOGASTRODUODENOSCOPY (EGD) WITH PROPOFOL N/A 01/02/2017   Procedure: ESOPHAGOGASTRODUODENOSCOPY (EGD) WITH PROPOFOL;  Surgeon: Lollie Sails, MD;  Location: [facility] ENDOSCOPY;  Service: Endoscopy;  Laterality: N/A;     reports that she quit smoking about 41 years ago. Her smoking use included cigarettes. She has a 15.00 pack-year smoking history. She has never used smokeless tobacco. She reports that she does not drink alcohol and does not use drugs.  Allergies  Allergen Reactions  . Amoxicillin Diarrhea  . Cefdinir Diarrhea  . Doxycycline Diarrhea  . Eliquis [Apixaban] Other (See Comments)    Dizziness   . Erythromycin Diarrhea  . Lisinopril Cough  . Macrodantin [Nitrofurantoin] Diarrhea  . Mobic [Meloxicam]     diarrhea  . Statins Other (See Comments)    Joint pain  . Sulfa Antibiotics Hives    Family History  Problem Relation Age of Onset  . Breast cancer Sister 40  . Breast cancer Sister   . Diabetes Mother   . Heart disease Mother   . Kidney disease Father   . Hypertension Father   . Fanconi anemia Sister      Prior to Admission medications   Medication Sig Start Date End Date Taking? Authorizing Provider  acetaminophen (TYLENOL 8 HOUR ARTHRITIS PAIN) 650 MG CR tablet Take 1,300 mg by mouth in the morning.   Yes [provider]  acidophilus (RISAQUAD) CAPS capsule Take 1 capsule by mouth 2 (two) times daily with a meal. Breakfast and lunch   Yes [provider]  allopurinol (ZYLOPRIM) 100 MG tablet Take 200 mg by mouth daily.  08/06/17  Yes [provider]  amLODipine (NORVASC) 10 MG tablet Take 10 mg by mouth daily.   02/26/20  Yes [provider]  cetirizine (ZYRTEC) 10 MG tablet Take 10 mg by mouth daily.   Yes [provider]  conjugated estrogens (PREMARIN) vaginal cream INSERT 0.5 GRAMS VAGINALLY  TWICE WEEKLY 10/15/19  Yes [provider]  Cyanocobalamin (B-12 COMPLIANCE INJECTION IJ) Inject as directed every 30 (thirty) days.   Yes [provider]  dextrose (GLUTOSE) 40 % GEL Take 1 Tube by mouth once as needed for low blood sugar.   Yes [provider]  dicyclomine (BENTYL) 10 MG capsule Take 10 mg by mouth 4 (four) times daily as needed. 03/17/20  Yes [provider]  ergocalciferol (VITAMIN D2) 50000 units capsule Take 50,000 Units by mouth once a week.   Yes [provider]  furosemide (LASIX) 80 MG tablet Take 80 mg by mouth daily.   Yes [provider]  gabapentin (NEURONTIN) 300 MG capsule Take 300 mg by mouth 2 (two) times daily. Pt takes 1 capsule in the morning and 2 capsules at bedtime.   Yes [provider]  [place] (  DERMACLOUD) CREA Apply 1 application topically daily as needed (skin irritation).   Yes [provider]  iron polysaccharides (NIFEREX) 150 MG capsule Take 150 mg by mouth daily.  10/13/19  Yes [provider]  loperamide (IMODIUM) 2 MG capsule Take 4 mg by mouth as needed for diarrhea or loose stools.   Yes [provider]  LORazepam (ATIVAN) 0.5 MG tablet Take 0.5 mg by mouth daily as needed for anxiety.    Yes [provider]  losartan (COZAAR) 100 MG tablet Take 100 mg by mouth daily.   Yes [provider]  metoprolol tartrate (LOPRESSOR) 25 MG tablet Take 25 mg by mouth 2 (two) times daily.   Yes [provider]  ondansetron (ZOFRAN) 4 MG tablet Take 4 mg by mouth every 6 (six) hours as needed for nausea or vomiting.   Yes [provider]  pantoprazole (PROTONIX) 40 MG tablet Take 40 mg by mouth daily.    Yes [provider]  rOPINIRole (REQUIP) 0.5 MG tablet Take 0.5 mg by mouth at bedtime.   Yes [provider]  sertraline (ZOLOFT) 50 MG tablet Take 50 mg by mouth daily.  10/23/17 05/11/2020 Yes [provider]  sucralfate (CARAFATE) 1 g tablet Take 1 g by mouth 2 (two) times daily. 11/24/19  Yes [provider]  traMADol (ULTRAM) 50 MG tablet Take 50 mg by mouth every 12 (twelve) hours as needed.  01/09/18  Yes [provider]  triamcinolone (NASACORT) 55 MCG/ACT AERO nasal inhaler 2 sprays daily. 11/24/19  Yes [provider]  albuterol (PROVENTIL HFA;VENTOLIN HFA) 108 (90 Base) MCG/ACT inhaler Inhale 2 puffs every 6 (six) hours as needed into the lungs for wheezing or shortness of breath.    [provider]  Insulin Glargine (LANTUS SOLOSTAR) 100 UNIT/ML Solostar Pen Inject 48 Units into the skin daily.     [provider]  liraglutide (VICTOZA) 18 MG/3ML SOPN Inject 1.8 mg into the skin daily.     [provider]  nitroGLYCERIN (NITROSTAT) 0.4 MG SL tablet Place 0.4 mg under the tongue every 5 (five) minutes as needed for chest pain.     [provider]    Physical Exam: Vitals:   05/04/2020 1230 04/20/2020 1330 05/04/2020 1415 05/11/2020 1431  BP: (!) 114/49 (!) 106/48  (!) 105/45  Pulse: 81 81 83   Resp: (!) _0 Temp:      TempSrc:      SpO2:         Vitals:   05/15/2020 1230 04/26/2020 1330 04/28/2020 1415 04/19/2020 1431  BP: (!) 114/49 (!) 106/48  (!) 105/45  Pulse: 81 81 83   Resp: (!) _1 Temp:      TempSrc:      SpO2:        Constitutional: Lethargic, opens eyes to loud verbal stimuli Eyes: PERRL, lids and conjunctivae pallor ENMT: Mucous membranes are dry.  Neck: normal, supple, no masses, no thyromegaly Respiratory: Bilateral air entry, no wheezing, no crackles. Normal respiratory effort. No accessory muscle use.  Cardiovascular: Regular rate and rhythm, no murmurs / rubs / gallops. No extremity  edema. 2+ pedal pulses. No carotid bruits.  Abdomen: Distended, tenderness around the periumbilical area, no masses palpated. No hepatosplenomegaly. Bowel sounds are hypoactive Musculoskeletal: no clubbing / cyanosis. No joint deformity upper and lower extremities.  Skin: no rashes, lesions, ulcers.  Neurologic: Unable to assess Psychiatric: Unable to assess  Labs on Admission: I have personally reviewed following labs and imaging studies  CBC: Recent Labs  Lab 05/09/2020 1137  WBC 13.8*  NEUTROABS 10.6*  HGB 10.3*  HCT 32.3*  MCV 97.9  PLT 916*   Basic Metabolic Panel: Recent Labs  Lab 04/18/2020 1137  NA 135  K 3.4*  CL 93*  CO2 25  GLUCOSE 196*  BUN 31*  CREATININE 3.37*  CALCIUM 8.8*  MG 2.3   GFR: Estimated Creatinine Clearance: 10.4 mL/min (A) (by C-G formula based on SCr of 3.37 mg/dL (H)). Liver Function Tests: Recent Labs  Lab 05/16/2020 1137  AST 19  ALT 18  ALKPHOS 91  BILITOT 1.3*  PROT 6.2*  ALBUMIN 3.0*   Recent Labs  Lab 04/27/2020 1137  LIPASE 24   No results for input(s): AMMONIA in the last 168 hours. Coagulation Profile: No results for input(s): INR, PROTIME in the last 168 hours. Cardiac Enzymes: No results for input(s): CKTOTAL, CKMB, CKMBINDEX, TROPONINI in the last 168 hours. BNP (last 3 results) No results for input(s): PROBNP in the last 8760 hours. HbA1C: No results for input(s): HGBA1C in the last 72 hours. CBG: Recent Labs  Lab 05/07/2020 1458  GLUCAP 186*   Lipid Profile: No results for input(s): CHOL, HDL, LDLCALC, TRIG, CHOLHDL, LDLDIRECT in the last 72 hours. Thyroid Function Tests: No results for input(s): TSH, T4TOTAL, FREET4, T3FREE, THYROIDAB in the last 72 hours. Anemia Panel: No results for input(s): VITAMINB12, FOLATE, FERRITIN, TIBC, IRON, RETICCTPCT in the last 72 hours. Urine analysis:    Component Value Date/Time   BILIRUBINUR negative 02/05/2020 1531   KETONESUR negative 02/05/2020 1531   PROTEINUR  negative 02/05/2020 1531   UROBILINOGEN 0.2 02/05/2020 1531   NITRITE Negative 02/05/2020 1531   LEUKOCYTESUR Small (1+) (A) 02/05/2020 1531    Radiological Exams on Admission: CT Abdomen Pelvis Wo Contrast  Result Date: 04/22/2020 CLINICAL DATA:  Per RN note in pt chart "Pt presents via EMS from [facility] c/o N/V/D x1 week after starting dialysis. Pt AANDOx4." EXAM: CT ABDOMEN AND PELVIS WITHOUT CONTRAST TECHNIQUE: Multidetector CT imaging of the abdomen and pelvis was performed following the standard protocol without IV contrast. COMPARISON:  Ultrasound, 08/10/2017.  CT chest, 11/11/2014. FINDINGS: Lower chest: Linear and reticular lung base opacities are noted consistent with atelectasis, scarring or a combination, similar to the prior chest CT. No acute findings. Hepatobiliary: Liver demonstrates nodularity, central volume loss and relative enlargement of the lateral segment of the left lobe and caudate lobe, consistent with advanced cirrhosis. No defined liver mass or lesion on this unenhanced exam. Liver appearance is similar to the prior chest CT. Gallbladder surgically absent. No bile duct dilation. Pancreas: Unremarkable. No pancreatic ductal dilatation or surrounding inflammatory changes. Spleen: Enlarged spleen, 14 cm. There are splenic calcifications consistent with healed granuloma. No masses. Adrenals/Urinary Tract: No adrenal masses. Bilateral renal cortical thinning leading to reduced renal sizes, right greater than left. No renal masses. No collecting system stones. No hydronephrosis. Normal ureters. Non dependent air noted in the bladder which may be from recent instrumentation. No bladder wall thickening, mass or stone. Stomach/Bowel: Stomach moderately distended. No wall thickening or inflammation. Dilated proximal small bowel, maximum diameter of 4.3 cm. There is associated air-fluid levels. No wall thickening or inflammation. Small bowel transitions to normal caliber in the  central abdomen, with a somewhat gradual transition. Distal small bowel is normal in caliber. Colon is normal in caliber. No colonic wall thickening or inflammation. Vascular/Lymphatic: Dense aortic  and branch vessel atherosclerotic calcifications. No aneurysm. No enlarged lymph nodes. Reproductive: Status post hysterectomy. No adnexal masses. Other: No hernia or ascites. Musculoskeletal: No fracture or acute finding. No osteoblastic or osteolytic lesions. IMPRESSION: 1. Dilated proximal small bowel transitioning to normal caliber small bowel in the mid abdomen. The transition is somewhat gradual, but a partial small bowel obstruction is still suspected as the etiology. No bowel wall thickening or inflammation. 2. No other evidence of an acute abnormality within the abdomen or pelvis. 3. Advanced cirrhosis. A component of portal venous hypertension is suggested by mild splenomegaly. These findings are similar to the prior chest CT. No visualized liver mass on the unenhanced images. 4. Significant bilateral renal cortical thinning consistent with medical renal disease. 5. Extensive aortic and branch vessel atherosclerosis. Electronically Signed   By: Lajean Manes M.D.   On: 05/04/2020 12:12   DG Chest Portable 1 View  Result Date: 04/22/2020 CLINICAL DATA:  86 year old female with nausea, vomiting, and diarrhea after starting dialysis. EXAM: PORTABLE CHEST 1 VIEW COMPARISON:  04/17/2020 FINDINGS: Interval insertion of right internal jugular tunneled hemodialysis catheter with the tip terminating in right atrium. Cardiomediastinal silhouette is within normal limits and unchanged. Multiple surgical staples are again seen projecting over the mediastinum as well as intact median sternotomy wires. Trace bilateral, left greater than right subsegmental atelectasis. Interval improvement in previously visualized pulmonary edema. Mild blunting of left costophrenic angle. No pneumothorax. Coarse atherosclerotic  calcifications of the thoracic aorta. No acute osseous abnormality. IMPRESSION: 1. Overall improved fluid status with resolution of previously visualized mild pulmonary edema. There is a possible trace left pleural effusion. 2. Interval insertion of right internal jugular tunneled hemodialysis catheter with the tip in the right atrium. No evidence of pneumothorax. 3.  Aortic Atherosclerosis (ICD10-I70.0). Electronically Signed   By: Ruthann Cancer MD   On: 04/25/2020 12:20    EKG: Independently reviewed.  Sinus rhythm Left anterior fascicular block  Assessment/Plan Principal Problem:   SBO (small bowel obstruction) (HCC) Active Problems:   HTN (hypertension)   Diabetes (HCC)   ESRD (end stage renal disease) (HCC)   Chronic diastolic CHF (congestive heart failure) (HCC)   Acute metabolic encephalopathy      Partial small bowel obstruction Patient presents to the emergency room via EMS for evaluation of abdominal pain mostly in the periumbilical area which she has had for over a week associated with poor oral intake, nausea, vomiting and diarrhea Imaging is suggestive of a partial small bowel obstruction Abdomen is distended and patient is tender in the periumbilical area NG tube for gastric decompression Keep patient n.p.o. Supportive care with antiemetics and IV pain medication We will request surgical consult   Diabetes mellitus We will keep patient n.p.o. for now Place patient on dextrose containing fluids Check blood sugars every 4 hours   End-stage renal disease on hemodialysis Dialysis days are M/W/F We will request nephrology consult for renal replacement therapy   Hypertension Hold all oral antihypertensives IV hydralazine for systolic blood pressure greater than 116mHg   Chronic diastolic dysfunction CHF Stable and not acutely exacerbated Optimize blood pressure control Continue renal replacement therapy   Acute metabolic encephalopathy Patient is  lethargic but arouses to loud verbal stimuli Mental status changes may be secondary to pain medication patient received Check blood sugars every 4 hours Monitor patient closely     DVT prophylaxis: Heparin Code Status: DO NOT RESUSCITATE Family Communication: Greater than 50% of time was spent discussing patient's condition and plan  of care with her daughter Maylon Peppers at the bedside.  All questions and concerns have been addressed.  She verbalizes understanding and agrees with the plan.  CODE STATUS was discussed and patient is a DO NOT RESUSCITATE. Disposition Plan: Back to previous home environment Consults called: Nephrology/Surgery    Collier Bullock MD [facility]     05/07/2020, 3:28 PM

## 2020-05-01 NOTE — ED Notes (Signed)
Patient transported to CT 

## 2020-05-01 NOTE — Consult Note (Signed)
Date of Consultation:  04/27/2020  Requesting Physician:  Collier Bullock, MD  Reason for Consultation:  Small bowel obstruction  History of Present Illness: Katherine Hernandez is a 86 y.o. female admitted today for small bowel obstruction.  She presented to the ED with nausea and vomiting.  She reports that she does "not feel good" but I'm unable to elicit any further information from her.  Per ED and admission notes, she presented with nausea and vomiting and diarrhea.  She stated that her abdomen was distended.  She was recently admitted on 04/17/20 with shortness of breath and was started on hemodialysis and discharged on 10/7 to SNF.  In the ED, CXR showed much improved pulmonary edema and effusion.  CT scan abdomen and pelvis showed distended loops of proximal small bowel with transition point in the mid abdomen, consistent with small bowel obstruction.    Past Medical History: Past Medical History:  Diagnosis Date  . Arthritis   . CAD (coronary artery disease)    Last nuclear 02/2009 EF 72% and neg for ischemia   . CHF (congestive heart failure) ([place])   . CKD (chronic kidney disease) 03/17/2020  . Coronary artery disease    CAD with PTCA(cfx)fx -1993,and CABG ,2004  . Diabetes mellitus without complication ([place])   . Diabetic nephropathy ([place])   . Diabetic peripheral neuropathy ([place])   . GERD (gastroesophageal reflux disease)   . Hyperlipidemia   . Hypertension   . Obesity   . Retinopathy, diabetic, background ([place])   . RLS (restless legs syndrome)   . Thrombocytopenia ([place])   . Urinary incontinence      Past Surgical History: Past Surgical History:  Procedure Laterality Date  . ABDOMINAL HYSTERECTOMY    . BONE MARROW BIOPSY    . BREAST BIOPSY Left 1970   surgical exc  . BREAST BIOPSY Right 09/13/2016   path pending x 2 areas  . CHOLECYSTECTOMY  1968  . COLONOSCOPY WITH PROPOFOL N/A 01/02/2017   Procedure: COLONOSCOPY WITH PROPOFOL;  Surgeon: Lollie Sails, MD;   Location: [facility]  ENDOSCOPY;  Service: Endoscopy;  Laterality: N/A;  . CORONARY ANGIOPLASTY    . CORONARY ARTERY BYPASS GRAFT  02/2003  . DIALYSIS/PERMA CATHETER INSERTION N/A 04/19/2020   Procedure: DIALYSIS/PERMA CATHETER INSERTION;  Surgeon: Algernon Huxley, MD;  Location: [place] [facility];  Service: Cardiovascular;  Laterality: N/A;  . ery    . ESOPHAGOGASTRODUODENOSCOPY (EGD) WITH PROPOFOL N/A 01/02/2017   Procedure: ESOPHAGOGASTRODUODENOSCOPY (EGD) WITH PROPOFOL;  Surgeon: Lollie Sails, MD;  Location: [facility] ENDOSCOPY;  Service: Endoscopy;  Laterality: N/A;    Home Medications: Prior to Admission medications   Medication Sig Start Date End Date Taking? Authorizing Provider  acetaminophen (TYLENOL 8 HOUR ARTHRITIS PAIN) 650 MG CR tablet Take 1,300 mg by mouth in the morning.   Yes [provider]  acidophilus (RISAQUAD) CAPS capsule Take 1 capsule by mouth 2 (two) times daily with a meal. Breakfast and lunch   Yes [provider]  allopurinol (ZYLOPRIM) 100 MG tablet Take 200 mg by mouth daily.  08/06/17  Yes [provider]  amLODipine (NORVASC) 10 MG tablet Take 10 mg by mouth daily.  02/26/20  Yes [provider]  cetirizine (ZYRTEC) 10 MG tablet Take 10 mg by mouth daily.   Yes [provider]  conjugated estrogens (PREMARIN) vaginal cream INSERT 0.5 GRAMS VAGINALLY  TWICE WEEKLY 10/15/19  Yes [provider]  Cyanocobalamin (B-12 COMPLIANCE INJECTION IJ) Inject as directed every 30 (  thirty) days.   Yes [provider]  dextrose (GLUTOSE) 40 % GEL Take 1 Tube by mouth once as needed for low blood sugar.   Yes [provider]  dicyclomine (BENTYL) 10 MG capsule Take 10 mg by mouth 4 (four) times daily as needed. 03/17/20  Yes [provider]  ergocalciferol (VITAMIN D2) 50000 units capsule Take 50,000 Units by mouth once a week.   Yes [provider]  furosemide (LASIX) 80 MG tablet Take 80 mg by mouth  daily.   Yes [provider]  gabapentin (NEURONTIN) 300 MG capsule Take 300 mg by mouth 2 (two) times daily. Pt takes 1 capsule in the morning and 2 capsules at bedtime.   Yes [provider]  Infant Care Products (DERMACLOUD) CREA Apply 1 application topically daily as needed (skin irritation).   Yes [provider]  iron polysaccharides (NIFEREX) 150 MG capsule Take 150 mg by mouth daily.  10/13/19  Yes [provider]  loperamide (IMODIUM) 2 MG capsule Take 4 mg by mouth as needed for diarrhea or loose stools.   Yes [provider]  LORazepam (ATIVAN) 0.5 MG tablet Take 0.5 mg by mouth daily as needed for anxiety.    Yes [provider]  losartan (COZAAR) 100 MG tablet Take 100 mg by mouth daily.   Yes [provider]  metoprolol tartrate (LOPRESSOR) 25 MG tablet Take 25 mg by mouth 2 (two) times daily.   Yes [provider]  ondansetron (ZOFRAN) 4 MG tablet Take 4 mg by mouth every 6 (six) hours as needed for nausea or vomiting.   Yes [provider]  pantoprazole (PROTONIX) 40 MG tablet Take 40 mg by mouth daily.    Yes [provider]  rOPINIRole (REQUIP) 0.5 MG tablet Take 0.5 mg by mouth at bedtime.   Yes [provider]  sertraline (ZOLOFT) 50 MG tablet Take 50 mg by mouth daily.  10/23/17 04/30/2020 Yes [provider]  sucralfate (CARAFATE) 1 g tablet Take 1 g by mouth 2 (two) times daily. 11/24/19  Yes [provider]  traMADol (ULTRAM) 50 MG tablet Take 50 mg by mouth every 12 (twelve) hours as needed.  01/09/18  Yes [provider]  triamcinolone (NASACORT) 55 MCG/ACT AERO nasal inhaler 2 sprays daily. 11/24/19  Yes [provider]  albuterol (PROVENTIL HFA;VENTOLIN HFA) 108 (90 Base) MCG/ACT inhaler Inhale 2 puffs every 6 (six) hours as needed into the lungs for wheezing or shortness of breath.    [provider]  Insulin Glargine (LANTUS SOLOSTAR)  100 UNIT/ML Solostar Pen Inject 48 Units into the skin daily.     [provider]  liraglutide (VICTOZA) 18 MG/3ML SOPN Inject 1.8 mg into the skin daily.     [provider]  nitroGLYCERIN (NITROSTAT) 0.4 MG SL tablet Place 0.4 mg under the tongue every 5 (five) minutes as needed for chest pain.     [provider]    Allergies: Allergies  Allergen Reactions  . Amoxicillin Diarrhea  . Cefdinir Diarrhea  . Doxycycline Diarrhea  . Eliquis [Apixaban] Other (See Comments)    Dizziness   . Erythromycin Diarrhea  . Lisinopril Cough  . Macrodantin [Nitrofurantoin] Diarrhea  . Mobic [Meloxicam]     diarrhea  . Statins Other (See Comments)    Joint pain  . Sulfa Antibiotics Hives    Social History:  reports that she quit smoking about 41 years ago. Her smoking use included cigarettes. She  has a 15.00 pack-year smoking history. She has never used smokeless tobacco. She reports that she does not drink alcohol and does not use drugs.   Family History: Family History  Problem Relation Age of Onset  . Breast cancer Sister 64  . Breast cancer Sister   . Diabetes Mother   . Heart disease Mother   . Kidney disease Father   . Hypertension Father   . Fanconi anemia Sister     Review of Systems: Review of Systems  Unable to perform ROS: Mental acuity    Physical Exam BP (!) 105/45   Pulse 83   Temp 98.5 F (36.9 C) (Oral)   Resp 17   SpO2 96%  CONSTITUTIONAL: Appears sleepy/somnolent HEENT:  Normocephalic, atraumatic, extraocular motion intact. NECK: Trachea is midline, and there is no jugular venous distension. RESPIRATORY:  Normal respiratory effort without pathologic use of accessory muscles. CARDIOVASCULAR:  Regular rhythm and rate. GI: The abdomen is soft, distended, with some tenderness in the mid abdomen.  Has scars from prior open cholecystectomy and low midline hysterectomy.  MUSCULOSKELETAL:  Normal muscle strength and tone in all four  extremities. SKIN: Skin turgor is normal. There are no pathologic skin lesions.  NEUROLOGIC:  Motor and sensation is grossly normal.  Cranial nerves are grossly intact. PSYCH:  Unable to assess due to current mental state.  Laboratory Analysis: Results for orders placed or performed during the hospital encounter of 05/07/2020 (from the past 24 hour(s))  CBC with Differential     Status: Abnormal   Collection Time: 05/13/2020 11:37 AM  Result Value Ref Range   WBC 13.8 (H) 4.0 - 10.5 K/uL   RBC 3.30 (L) 3.87 - 5.11 MIL/uL   Hemoglobin 10.3 (L) 12.0 - 15.0 g/dL   HCT 32.3 (L) 36 - 46 %   MCV 97.9 80.0 - 100.0 fL   MCH 31.2 26.0 - 34.0 pg   MCHC 31.9 30.0 - 36.0 g/dL   RDW 15.9 (H) 11.5 - 15.5 %   Platelets 145 (L) 150 - 400 K/uL   nRBC 0.0 0.0 - 0.2 %   Neutrophils Relative % 78 %   Neutro Abs 10.6 (H) 1.7 - 7.7 K/uL   Lymphocytes Relative 10 %   Lymphs Abs 1.4 0.7 - 4.0 K/uL   Monocytes Relative 11 %   Monocytes Absolute 1.5 (H) 0.1 - 1.0 K/uL   Eosinophils Relative 0 %   Eosinophils Absolute 0.1 0.0 - 0.5 K/uL   Basophils Relative 0 %   Basophils Absolute 0.1 0.0 - 0.1 K/uL   WBC Morphology MILD LEFT SHIFT (1-5% METAS, OCC MYELO, OCC BANDS)    RBC Morphology MORPHOLOGY UNREMARKABLE    Smear Review Normal platelet morphology    Immature Granulocytes 1 %   Abs Immature Granulocytes 0.14 (H) 0.00 - 0.07 K/uL  Comprehensive metabolic panel     Status: Abnormal   Collection Time: 05/13/2020 11:37 AM  Result Value Ref Range   Sodium 135 135 - 145 mmol/L   Potassium 3.4 (L) 3.5 - 5.1 mmol/L   Chloride 93 (L) 98 - 111 mmol/L   CO2 25 22 - 32 mmol/L   Glucose, Bld 196 (H) 70 - 99 mg/dL   BUN 31 (H) 8 - 23 mg/dL   Creatinine, Ser 3.37 (H) 0.44 - 1.00 mg/dL   Calcium 8.8 (L) 8.9 - 10.3 mg/dL   Total Protein 6.2 (L) 6.5 - 8.1 g/dL   Albumin 3.0 (L) 3.5 - 5.0 g/dL  AST 19 15 - 41 U/L   ALT 18 0 - 44 U/L   Alkaline Phosphatase 91 38 - 126 U/L   Total Bilirubin 1.3 (H) 0.3 - 1.2 mg/dL    GFR, Estimated 12 (L) >60 mL/min   Anion gap 17 (H) 5 - 15  Lipase, blood     Status: None   Collection Time: 05/10/2020 11:37 AM  Result Value Ref Range   Lipase 24 11 - 51 U/L  Magnesium     Status: None   Collection Time: 04/21/2020 11:37 AM  Result Value Ref Range   Magnesium 2.3 1.7 - 2.4 mg/dL  Respiratory Panel by RT PCR (Flu A&B, Covid) - Nasopharyngeal Swab     Status: None   Collection Time: 04/30/2020 12:43 PM   Specimen: Nasopharyngeal Swab  Result Value Ref Range   SARS Coronavirus 2 by RT PCR NEGATIVE NEGATIVE   Influenza A by PCR NEGATIVE NEGATIVE   Influenza B by PCR NEGATIVE NEGATIVE  Glucose, capillary     Status: Abnormal   Collection Time: 04/30/2020  2:58 PM  Result Value Ref Range   Glucose-Capillary 186 (H) 70 - 99 mg/dL    Imaging: CT Abdomen Pelvis Wo Contrast  Result Date: 04/24/2020 CLINICAL DATA:  Per RN note in pt chart "Pt presents via EMS from [facility] c/o N/V/D x1 week after starting dialysis. Pt AANDOx4." EXAM: CT ABDOMEN AND PELVIS WITHOUT CONTRAST TECHNIQUE: Multidetector CT imaging of the abdomen and pelvis was performed following the standard protocol without IV contrast. COMPARISON:  Ultrasound, 08/10/2017.  CT chest, 11/11/2014. FINDINGS: Lower chest: Linear and reticular lung base opacities are noted consistent with atelectasis, scarring or a combination, similar to the prior chest CT. No acute findings. Hepatobiliary: Liver demonstrates nodularity, central volume loss and relative enlargement of the lateral segment of the left lobe and caudate lobe, consistent with advanced cirrhosis. No defined liver mass or lesion on this unenhanced exam. Liver appearance is similar to the prior chest CT. Gallbladder surgically absent. No bile duct dilation. Pancreas: Unremarkable. No pancreatic ductal dilatation or surrounding inflammatory changes. Spleen: Enlarged spleen, 14 cm. There are splenic calcifications consistent with healed granuloma. No masses.  Adrenals/Urinary Tract: No adrenal masses. Bilateral renal cortical thinning leading to reduced renal sizes, right greater than left. No renal masses. No collecting system stones. No hydronephrosis. Normal ureters. Non dependent air noted in the bladder which may be from recent instrumentation. No bladder wall thickening, mass or stone. Stomach/Bowel: Stomach moderately distended. No wall thickening or inflammation. Dilated proximal small bowel, maximum diameter of 4.3 cm. There is associated air-fluid levels. No wall thickening or inflammation. Small bowel transitions to normal caliber in the central abdomen, with a somewhat gradual transition. Distal small bowel is normal in caliber. Colon is normal in caliber. No colonic wall thickening or inflammation. Vascular/Lymphatic: Dense aortic and branch vessel atherosclerotic calcifications. No aneurysm. No enlarged lymph nodes. Reproductive: Status post hysterectomy. No adnexal masses. Other: No hernia or ascites. Musculoskeletal: No fracture or acute finding. No osteoblastic or osteolytic lesions. IMPRESSION: 1. Dilated proximal small bowel transitioning to normal caliber small bowel in the mid abdomen. The transition is somewhat gradual, but a partial small bowel obstruction is still suspected as the etiology. No bowel wall thickening or inflammation. 2. No other evidence of an acute abnormality within the abdomen or pelvis. 3. Advanced cirrhosis. A component of portal venous hypertension is suggested by mild splenomegaly. These findings are similar to the prior chest CT. No visualized  liver mass on the unenhanced images. 4. Significant bilateral renal cortical thinning consistent with medical renal disease. 5. Extensive aortic and branch vessel atherosclerosis. Electronically Signed   By: Lajean Manes M.D.   On: 05/10/2020 12:12   DG Chest Portable 1 View  Result Date: 05/15/2020 CLINICAL DATA:  86 year old female with nausea, vomiting, and diarrhea after  starting dialysis. EXAM: PORTABLE CHEST 1 VIEW COMPARISON:  04/17/2020 FINDINGS: Interval insertion of right internal jugular tunneled hemodialysis catheter with the tip terminating in right atrium. Cardiomediastinal silhouette is within normal limits and unchanged. Multiple surgical staples are again seen projecting over the mediastinum as well as intact median sternotomy wires. Trace bilateral, left greater than right subsegmental atelectasis. Interval improvement in previously visualized pulmonary edema. Mild blunting of left costophrenic angle. No pneumothorax. Coarse atherosclerotic calcifications of the thoracic aorta. No acute osseous abnormality. IMPRESSION: 1. Overall improved fluid status with resolution of previously visualized mild pulmonary edema. There is a possible trace left pleural effusion. 2. Interval insertion of right internal jugular tunneled hemodialysis catheter with the tip in the right atrium. No evidence of pneumothorax. 3.  Aortic Atherosclerosis (ICD10-I70.0). Electronically Signed   By: Ruthann Cancer MD   On: 05/11/2020 12:20    Assessment and Plan: This is a 86 y.o. female with SBO.  She has multiple comorbidities including ESRD, HTN, CAD, CHF, DM.  --Unable to obtain history from patient.  However, on CT scan, she has dilated loops of small bowel proximally, consistent with SBO.  She's being admitted to the medical team due to all her other comorbidities.  For now, would recommend NG tube insertion to help decompress her bowel.  Keep NPO, gentle IV fluid hydration.  Recommend workup for her altered mental status.  Will continue to follow.  Face-to-face time spent with the patient and care providers was 55 minutes, with more than 50% of the time spent counseling, educating, and coordinating care of the patient.     Melvyn Neth, MD [facility] Pg:  [phone]

## 2020-05-02 ENCOUNTER — Encounter: Payer: Self-pay | Admitting: Internal Medicine

## 2020-05-02 ENCOUNTER — Inpatient Hospital Stay: Payer: Medicare Other

## 2020-05-02 DIAGNOSIS — E1142 Type 2 diabetes mellitus with diabetic polyneuropathy: Secondary | ICD-10-CM | POA: Diagnosis present

## 2020-05-02 DIAGNOSIS — G9341 Metabolic encephalopathy: Secondary | ICD-10-CM | POA: Diagnosis not present

## 2020-05-02 DIAGNOSIS — E1121 Type 2 diabetes mellitus with diabetic nephropathy: Secondary | ICD-10-CM | POA: Diagnosis present

## 2020-05-02 DIAGNOSIS — I700 Atherosclerosis of aorta: Secondary | ICD-10-CM

## 2020-05-02 DIAGNOSIS — Z7189 Other specified counseling: Secondary | ICD-10-CM

## 2020-05-02 DIAGNOSIS — I2510 Atherosclerotic heart disease of native coronary artery without angina pectoris: Secondary | ICD-10-CM | POA: Diagnosis present

## 2020-05-02 DIAGNOSIS — E113299 Type 2 diabetes mellitus with mild nonproliferative diabetic retinopathy without macular edema, unspecified eye: Secondary | ICD-10-CM | POA: Diagnosis present

## 2020-05-02 DIAGNOSIS — E669 Obesity, unspecified: Secondary | ICD-10-CM | POA: Diagnosis present

## 2020-05-02 DIAGNOSIS — K56600 Partial intestinal obstruction, unspecified as to cause: Secondary | ICD-10-CM | POA: Diagnosis not present

## 2020-05-02 DIAGNOSIS — E119 Type 2 diabetes mellitus without complications: Secondary | ICD-10-CM

## 2020-05-02 DIAGNOSIS — I10 Essential (primary) hypertension: Secondary | ICD-10-CM

## 2020-05-02 DIAGNOSIS — Z66 Do not resuscitate: Secondary | ICD-10-CM | POA: Diagnosis present

## 2020-05-02 DIAGNOSIS — K766 Portal hypertension: Secondary | ICD-10-CM | POA: Diagnosis present

## 2020-05-02 DIAGNOSIS — J90 Pleural effusion, not elsewhere classified: Secondary | ICD-10-CM | POA: Diagnosis present

## 2020-05-02 DIAGNOSIS — K7460 Unspecified cirrhosis of liver: Secondary | ICD-10-CM | POA: Diagnosis present

## 2020-05-02 DIAGNOSIS — E785 Hyperlipidemia, unspecified: Secondary | ICD-10-CM | POA: Diagnosis present

## 2020-05-02 DIAGNOSIS — R161 Splenomegaly, not elsewhere classified: Secondary | ICD-10-CM | POA: Diagnosis present

## 2020-05-02 DIAGNOSIS — R4182 Altered mental status, unspecified: Secondary | ICD-10-CM | POA: Diagnosis not present

## 2020-05-02 DIAGNOSIS — I480 Paroxysmal atrial fibrillation: Secondary | ICD-10-CM

## 2020-05-02 DIAGNOSIS — K56609 Unspecified intestinal obstruction, unspecified as to partial versus complete obstruction: Secondary | ICD-10-CM | POA: Diagnosis not present

## 2020-05-02 DIAGNOSIS — D696 Thrombocytopenia, unspecified: Secondary | ICD-10-CM

## 2020-05-02 LAB — BASIC METABOLIC PANEL
Anion gap: 17 — ABNORMAL HIGH (ref 5–15)
BUN: 49 mg/dL — ABNORMAL HIGH (ref 8–23)
CO2: 24 mmol/L (ref 22–32)
Calcium: 8.6 mg/dL — ABNORMAL LOW (ref 8.9–10.3)
Chloride: 96 mmol/L — ABNORMAL LOW (ref 98–111)
Creatinine, Ser: 4.95 mg/dL — ABNORMAL HIGH (ref 0.44–1.00)
GFR, Estimated: 7 mL/min — ABNORMAL LOW (ref >60)
Glucose, Bld: 181 mg/dL — ABNORMAL HIGH (ref 70–99)
Potassium: 3.7 mmol/L (ref 3.5–5.1)
Sodium: 137 mmol/L (ref 135–145)

## 2020-05-02 LAB — CBC
HCT: 32 % — ABNORMAL LOW (ref 36.0–46.0)
Hemoglobin: 10.2 g/dL — ABNORMAL LOW (ref 12.0–15.0)
MCH: 31.3 pg (ref 26.0–34.0)
MCHC: 31.9 g/dL (ref 30.0–36.0)
MCV: 98.2 fL (ref 80.0–100.0)
Platelets: 171 10*3/uL (ref 150–400)
RBC: 3.26 MIL/uL — ABNORMAL LOW (ref 3.87–5.11)
RDW: 16.1 % — ABNORMAL HIGH (ref 11.5–15.5)
WBC: 22.8 10*3/uL — ABNORMAL HIGH (ref 4.0–10.5)
nRBC: 0.1 % (ref 0.0–0.2)

## 2020-05-02 LAB — GLUCOSE, CAPILLARY
Glucose-Capillary: 172 mg/dL — ABNORMAL HIGH (ref 70–99)
Glucose-Capillary: 178 mg/dL — ABNORMAL HIGH (ref 70–99)
Glucose-Capillary: 184 mg/dL — ABNORMAL HIGH (ref 70–99)

## 2020-05-02 LAB — LACTIC ACID, PLASMA: Lactic Acid, Venous: >11 mmol/L (ref 0.5–1.9)

## 2020-05-02 LAB — MRSA PCR SCREENING: MRSA by PCR: NEGATIVE

## 2020-05-02 MED ORDER — HALOPERIDOL LACTATE 2 MG/ML PO CONC
0.5000 mg | ORAL | Status: DC | PRN
  Filled 2020-05-02: qty 0.3

## 2020-05-02 MED ORDER — HYDROMORPHONE HCL 1 MG/ML IJ SOLN: 0.5000 mg | INTRAMUSCULAR | Status: DC | PRN

## 2020-05-02 MED ORDER — HALOPERIDOL 0.5 MG PO TABS
0.5000 mg | ORAL_TABLET | ORAL | Status: DC | PRN
  Filled 2020-05-02: qty 1

## 2020-05-02 MED ORDER — HALOPERIDOL LACTATE 5 MG/ML IJ SOLN: 0.5000 mg | INTRAMUSCULAR | Status: DC | PRN

## 2020-05-02 MED ORDER — POLYVINYL ALCOHOL 1.4 % OP SOLN
1.0000 [drp] | Freq: Four times a day (QID) | OPHTHALMIC | Status: DC | PRN
  Filled 2020-05-02: qty 15

## 2020-05-02 MED ORDER — HYDROMORPHONE HCL 1 MG/ML IJ SOLN
0.5000 mg | Freq: Once | INTRAMUSCULAR | Status: AC
  Administered 2020-05-02: 0.5 mg via INTRAVENOUS

## 2020-05-02 MED ORDER — GLYCOPYRROLATE 1 MG PO TABS
1.0000 mg | ORAL_TABLET | ORAL | Status: DC | PRN
  Filled 2020-05-02: qty 1

## 2020-05-02 MED ORDER — GLYCOPYRROLATE 0.2 MG/ML IJ SOLN
0.2000 mg | INTRAMUSCULAR | Status: DC | PRN
  Filled 2020-05-02: qty 1

## 2020-05-02 MED ORDER — HYDROMORPHONE HCL 1 MG/ML IJ SOLN
0.5000 mg | INTRAMUSCULAR | Status: DC | PRN
  Administered 2020-05-02: 0.5 mg via INTRAVENOUS
  Filled 2020-05-02 (×2): qty 1

## 2020-05-02 MED ORDER — BIOTENE DRY MOUTH MT LIQD: 15.0000 mL | OROMUCOSAL | Status: DC | PRN

## 2020-05-02 MED ORDER — ACETAMINOPHEN 10 MG/ML IV SOLN
1000.0000 mg | Freq: Four times a day (QID) | INTRAVENOUS | Status: DC
  Administered 2020-05-02: 1000 mg via INTRAVENOUS
  Filled 2020-05-02 (×4): qty 100

## 2020-05-03 LAB — HEMOGLOBIN A1C
Hgb A1c MFr Bld: 5.8 % — ABNORMAL HIGH (ref 4.8–5.6)
Mean Plasma Glucose: 120 mg/dL

## 2020-05-04 ENCOUNTER — Ambulatory Visit: Payer: PRIVATE HEALTH INSURANCE | Admitting: Family

## 2020-05-13 ENCOUNTER — Other Ambulatory Visit: Payer: PRIVATE HEALTH INSURANCE

## 2020-05-14 ENCOUNTER — Other Ambulatory Visit: Payer: PRIVATE HEALTH INSURANCE

## 2020-05-17 ENCOUNTER — Ambulatory Visit: Payer: PRIVATE HEALTH INSURANCE | Admitting: Oncology

## 2020-05-17 ENCOUNTER — Ambulatory Visit: Payer: PRIVATE HEALTH INSURANCE

## 2020-05-17 NOTE — Death Summary Note (Signed)
DEATH SUMMARY   Patient Details  Name: Katherine Hernandez MRN: [id number] DOB: 05-28-1930  Admission/Discharge Information   Admit Date:  05-19-20  Date of Death: Date of Death: 2020-05-20  Time of Death: Time of Death: 70  Length of Stay: 1  Referring Physician: Tracie Harrier, MD   Reason(s) for Hospitalization  Small bowel obstruction  Diagnoses  Preliminary cause of death: Shock due to mesenteric ischemia Secondary Diagnoses (including complications and co-morbidities):  Principal Problem:   SBO (small bowel obstruction) ([place]) Active Problems:   HTN (hypertension)   Atrial fibrillation (HCC)   Insulin dependent type 2 diabetes mellitus ([place])   Normocytic anemia   Thrombocytopenia (HCC)   ESRD (end stage renal disease) (HCC)   Chronic diastolic CHF (congestive heart failure) (HCC)   Acute metabolic encephalopathy   CAD (coronary artery disease)   Diabetic peripheral neuropathy (HCC)   Retinopathy, diabetic, background ([place])   Diabetic nephropathy ([place])   DNR (do not resuscitate)   Goals of care, counseling/discussion   Hyperlipidemia   Obesity   Hepatic cirrhosis ([place])   Portal hypertension ([place])   Splenomegaly   Pleural effusion   Aortic atherosclerosis [facility])  Brief Hospital Course (including significant findings, care, treatment, and services provided and events leading to death)  Katherine Hernandez is an 86 y.o. female with a history of chronic HFpEF, T2DM, CAD s/p PTCA LCx 1993 and CABG 2004, HTN, HLD, obesity, thrombocytopenia, former smoker, and ESRD recently started on HD (MWF) who presented to the ED 20-May-2023 from [facility] due to periumbilical abdominal pain associated with N/V/D. She had recently been admitted for CHF and progressive renal failure, started on RRT and ultimately discharged 10/7.   She presented with altered mental status, afebrile with HR in 80-90's, BP soft with MAP in mostly 60-70's. Creatinine 3.37, BUN 31, K 3.4, bicarb 25, WBC 13.8k, hgb  10.3g/dl, platelets 145k. RVP negative. CT abd/pelvis demonstrated dilated small bowel with somewhat gradual transition to normal caliber in the mid abdomen suspicious for partial SBO. Findings of advanced hepatic cirrhosis and splenomegaly suggesting portal HTN and bilateral renal cortical thinning were also noted. CXR showed no infiltrate with left pleural effusion. The patient was admitted for SBO. Surgery and nephrology were consulted and the patient was given IV fluids, NG tube to suction.   The following morning the patient grew even more lethargic responding only to pain when abdomen was palpated, also hypotensive, tachycardic. Abdominal xray did not reveal free air. Leukocytosis worsened and lactic acid was undetectably elevated (>11). IV fluid bolus was given and goals of care discussions were conducted. The patient's daughter at the bedside who is HCPOA confirmed that the patient would, in the face of severe and rapid decompensation with very limited likelihood of meaningful recovery, opt to convert to comfort measures. This is what we did. Her family was allowed visitation and dilaudid was given for pain. She passed shortly thereafter. While it is unclear exactly what caused the deterioration in the absence of proof of perforation on XR, her abdominal pain, significant atherosclerotic disease and abrupt elevation in lactic acid with relatively little preceding hypotension leads me to suspect mesenteric ischemic insult of some kind.   Pertinent Labs and Studies  Significant Diagnostic Studies CT Abdomen Pelvis Wo Contrast  Result Date: 05/19/20 CLINICAL DATA:  Per RN note in pt chart "Pt presents via EMS from [place] c/o N/V/D x1 week after starting dialysis. Pt AANDOx4." EXAM: CT ABDOMEN AND PELVIS WITHOUT CONTRAST TECHNIQUE: Multidetector CT  imaging of the abdomen and pelvis was performed following the standard protocol without IV contrast. COMPARISON:  Ultrasound, 08/10/2017.  CT  chest, 11/11/2014. FINDINGS: Lower chest: Linear and reticular lung base opacities are noted consistent with atelectasis, scarring or a combination, similar to the prior chest CT. No acute findings. Hepatobiliary: Liver demonstrates nodularity, central volume loss and relative enlargement of the lateral segment of the left lobe and caudate lobe, consistent with advanced cirrhosis. No defined liver mass or lesion on this unenhanced exam. Liver appearance is similar to the prior chest CT. Gallbladder surgically absent. No bile duct dilation. Pancreas: Unremarkable. No pancreatic ductal dilatation or surrounding inflammatory changes. Spleen: Enlarged spleen, 14 cm. There are splenic calcifications consistent with healed granuloma. No masses. Adrenals/Urinary Tract: No adrenal masses. Bilateral renal cortical thinning leading to reduced renal sizes, right greater than left. No renal masses. No collecting system stones. No hydronephrosis. Normal ureters. Non dependent air noted in the bladder which may be from recent instrumentation. No bladder wall thickening, mass or stone. Stomach/Bowel: Stomach moderately distended. No wall thickening or inflammation. Dilated proximal small bowel, maximum diameter of 4.3 cm. There is associated air-fluid levels. No wall thickening or inflammation. Small bowel transitions to normal caliber in the central abdomen, with a somewhat gradual transition. Distal small bowel is normal in caliber. Colon is normal in caliber. No colonic wall thickening or inflammation. Vascular/Lymphatic: Dense aortic and branch vessel atherosclerotic calcifications. No aneurysm. No enlarged lymph nodes. Reproductive: Status post hysterectomy. No adnexal masses. Other: No hernia or ascites. Musculoskeletal: No fracture or acute finding. No osteoblastic or osteolytic lesions. IMPRESSION: 1. Dilated proximal small bowel transitioning to normal caliber small bowel in the mid abdomen. The transition is somewhat  gradual, but a partial small bowel obstruction is still suspected as the etiology. No bowel wall thickening or inflammation. 2. No other evidence of an acute abnormality within the abdomen or pelvis. 3. Advanced cirrhosis. A component of portal venous hypertension is suggested by mild splenomegaly. These findings are similar to the prior chest CT. No visualized liver mass on the unenhanced images. 4. Significant bilateral renal cortical thinning consistent with medical renal disease. 5. Extensive aortic and branch vessel atherosclerosis. Electronically Signed   By: Lajean Manes M.D.   On: 05/10/2020 12:12   DG Chest 1 View  Result Date: 04/17/2020 CLINICAL DATA:  Shortness of breath and edema EXAM: CHEST  1 VIEW COMPARISON:  May 25, 2017 FINDINGS: The heart size and mediastinal contours are unchanged with mild cardiomegaly. Aortic knob calcifications are seen. Overlying median sternotomy wires are present. There is prominence of the central pulmonary vasculature. No large airspace consolidation or pleural effusion. No acute osseous abnormality. IMPRESSION: Mild cardiomegaly and pulmonary vascular congestion Electronically Signed   By: Prudencio Pair M.D.   On: 04/17/2020 22:27   DG Abdomen 1 View  Result Date: 05/10/2020 CLINICAL DATA:  Evaluate NG tube placement EXAM: ABDOMEN - 1 VIEW COMPARISON:  None. FINDINGS: The side port and distal tip of the NG tube terminates in left upper quadrant, in the location of the stomach. No other acute abnormalities. IMPRESSION: Appropriate NG tube placement as above. Electronically Signed   By: Dorise Bullion III M.D   On: 04/28/2020 18:18   PERIPHERAL VASCULAR CATHETERIZATION  Result Date: 04/19/2020 See op note  DG Chest Portable 1 View  Result Date: 04/22/2020 CLINICAL DATA:  86 year old female with nausea, vomiting, and diarrhea after starting dialysis. EXAM: PORTABLE CHEST 1 VIEW COMPARISON:  04/17/2020 FINDINGS: Interval insertion  of right internal  jugular tunneled hemodialysis catheter with the tip terminating in right atrium. Cardiomediastinal silhouette is within normal limits and unchanged. Multiple surgical staples are again seen projecting over the mediastinum as well as intact median sternotomy wires. Trace bilateral, left greater than right subsegmental atelectasis. Interval improvement in previously visualized pulmonary edema. Mild blunting of left costophrenic angle. No pneumothorax. Coarse atherosclerotic calcifications of the thoracic aorta. No acute osseous abnormality. IMPRESSION: 1. Overall improved fluid status with resolution of previously visualized mild pulmonary edema. There is a possible trace left pleural effusion. 2. Interval insertion of right internal jugular tunneled hemodialysis catheter with the tip in the right atrium. No evidence of pneumothorax. 3.  Aortic Atherosclerosis (ICD10-I70.0). Electronically Signed   By: Ruthann Cancer MD   On: 05/05/2020 12:20   DG Abd Portable 2V  Result Date: 05/10/2020 CLINICAL DATA:  SBO EXAM: PORTABLE ABDOMEN - 2 VIEW COMPARISON:  04/17/2020 FINDINGS: Dilated loops of small bowel in the right lower quadrant, compatible with the clinical history of small bowel obstruction. Gas-filled ascending colon. Left colon is decompressed. Right lower quadrant hernia mesh. No evidence of free air under the diaphragm on the upright view. Enteric tube terminates in the gastric antrum. Visualized lungs are clear. No definite pleural effusions. The heart is normal in size. Postsurgical changes related to prior CABG. Thoracic aortic atherosclerosis. Right IJ dual lumen dialysis catheter terminates the cavoatrial junction. IMPRESSION: Dilated loops of small bowel in the right lower quadrant, compatible with the clinical history of small bowel obstruction. Enteric tube terminates in the gastric antrum. No free air. Electronically Signed   By: Julian Hy M.D.   On: 05-10-20 09:21   ECHOCARDIOGRAM  COMPLETE  Result Date: 04/18/2020    ECHOCARDIOGRAM REPORT   Patient Name:   DIALA WAXMAN [facility] Date of Exam: 04/18/2020 Medical Rec #:  [id number]      Height:       62.0 in Accession #:    [id number]     Weight:       175.0 lb Date of Birth:  1929/12/20      BSA:          1.806 m Patient Age:    32 years       BP:           134/61 mmHg Patient Gender: F              HR:           70 bpm. Exam Location:  [facility] Procedure: 2D Echo Indications:     CHF 428.31  History:         Patient has prior history of Echocardiogram examinations, most                  recent 05/26/2017.  Sonographer:     Arville Go RDCS Referring Phys:  [id number] [place] Diagnosing Phys: Yolonda Kida MD  Sonographer Comments: No subcostal window. IMPRESSIONS  1. Left ventricular ejection fraction, by estimation, is 50 to 55%. The left ventricle has low normal function. The left ventricle has no regional wall motion abnormalities. Left ventricular diastolic parameters were normal.  2. Right ventricular systolic function is normal. The right ventricular size is normal.  3. The mitral valve is myxomatous. No evidence of mitral valve regurgitation. Mild mitral stenosis. Moderate to severe mitral annular calcification.  4. The aortic valve is grossly normal. Aortic valve regurgitation is not visualized. FINDINGS  Left Ventricle: Left ventricular ejection  fraction, by estimation, is 50 to 55%. The left ventricle has low normal function. The left ventricle has no regional wall motion abnormalities. The left ventricular internal cavity size was normal in size. There is no left ventricular hypertrophy. Left ventricular diastolic parameters were normal. Right Ventricle: The right ventricular size is normal. No increase in right ventricular wall thickness. Right ventricular systolic function is normal. Left Atrium: Left atrial size was normal in size. Right Atrium: Right atrial size was normal in size. Pericardium: There is no evidence of pericardial  effusion. Mitral Valve: The mitral valve is myxomatous. There is moderate calcification of the mitral valve leaflet(s). Mildly decreased mobility of the mitral valve leaflets. Moderate to severe mitral annular calcification. No evidence of mitral valve regurgitation. Mild mitral valve stenosis. MV peak gradient, 15.1 mmHg. The mean mitral valve gradient is 7.0 mmHg. Tricuspid Valve: The tricuspid valve is normal in structure. Tricuspid valve regurgitation is not demonstrated. Aortic Valve: The aortic valve is grossly normal. Aortic valve regurgitation is not visualized. Aortic valve mean gradient measures 8.0 mmHg. Aortic valve peak gradient measures 16.2 mmHg. Aortic valve area, by VTI measures 1.37 cm. Pulmonic Valve: The pulmonic valve was normal in structure. Pulmonic valve regurgitation is not visualized. Aorta: The ascending aorta was not well visualized. IAS/Shunts: No atrial level shunt detected by color flow Doppler.  LEFT VENTRICLE PLAX 2D LVIDd:         3.40 cm  Diastology LVIDs:         2.55 cm  LV e' medial:    4.79 cm/s LV PW:         1.61 cm  LV E/e' medial:  31.3 LV IVS:        1.33 cm  LV e' lateral:   7.72 cm/s LVOT diam:     1.90 cm  LV E/e' lateral: 19.4 LV SV:         68 LV SV Index:   38 LVOT Area:     2.84 cm  RIGHT VENTRICLE RV Basal diam:  2.74 cm RV S prime:     13.80 cm/s TAPSE (M-mode): 2.1 cm LEFT ATRIUM           Index       RIGHT ATRIUM           Index LA diam:      3.70 cm 2.05 cm/m  RA Area:     11.00 cm LA Vol (A2C): 36.4 ml 20.15 ml/m RA Volume:   21.90 ml  12.12 ml/m LA Vol (A4C): 50.0 ml 27.68 ml/m  AORTIC VALVE                    PULMONIC VALVE AV Area (Vmax):    1.32 cm     PV Vmax:       1.33 m/s AV Area (Vmean):   1.32 cm     PV Peak grad:  7.1 mmHg AV Area (VTI):     1.37 cm AV Vmax:           201.00 cm/s AV Vmean:          126.000 cm/s AV VTI:            0.496 m AV Peak Grad:      16.2 mmHg AV Mean Grad:      8.0 mmHg LVOT Vmax:         93.30 cm/s LVOT Vmean:         58.700 cm/s LVOT VTI:  0.239 m LVOT/AV VTI ratio: 0.48  AORTA Ao Root diam: 2.70 cm Ao Asc diam:  3.10 cm MITRAL VALVE                TRICUSPID VALVE MV Area (PHT): 3.99 cm     TV Peak grad:   30.0 mmHg MV Peak grad:  15.1 mmHg    TV Vmax:        2.74 m/s MV Mean grad:  7.0 mmHg MV Vmax:       1.94 m/s     SHUNTS MV Vmean:      127.0 cm/s   Systemic VTI:  0.24 m MV Decel Time: 190 msec     Systemic Diam: 1.90 cm MV E velocity: 150.00 cm/s MV A velocity: 140.00 cm/s MV E/A ratio:  1.07 Dwayne D Callwood MD Electronically signed by Yolonda Kida MD Signature Date/Time: 04/18/2020/11:49:38 AM    Final     Microbiology Recent Results (from the past 240 hour(s))  Respiratory Panel by RT PCR (Flu A&B, Covid) - Nasopharyngeal Swab     Status: None   Collection Time: 05/05/2020 12:43 PM   Specimen: Nasopharyngeal Swab  Result Value Ref Range Status   SARS Coronavirus 2 by RT PCR NEGATIVE NEGATIVE Final    Comment: (NOTE) SARS-CoV-2 target nucleic acids are NOT DETECTED.  The SARS-CoV-2 RNA is generally detectable in upper respiratoy specimens during the acute phase of infection. The lowest concentration of SARS-CoV-2 viral copies this assay can detect is 131 copies/mL. A negative result does not preclude SARS-Cov-2 infection and should not be used as the sole basis for treatment or other patient management decisions. A negative result may occur with  improper specimen collection/handling, submission of specimen other than nasopharyngeal swab, presence of viral mutation(s) within the areas targeted by this assay, and inadequate number of viral copies (<131 copies/mL). A negative result must be combined with clinical observations, patient history, and epidemiological information. The expected result is Negative.  Fact Sheet for Patients:  PinkCheek.be  Fact Sheet for Healthcare Providers:  GravelBags.it  This test is no t yet  approved or cleared by the [place] FDA and  has been authorized for detection and/or diagnosis of SARS-CoV-2 by FDA under an Emergency Use Authorization (EUA). This EUA will remain  in effect (meaning this test can be used) for the duration of the COVID-19 declaration under Section 564(b)(1) of the Act, 21 U.S.C. section 360bbb-3(b)(1), unless the authorization is terminated or revoked sooner.     Influenza A by PCR NEGATIVE NEGATIVE Final   Influenza B by PCR NEGATIVE NEGATIVE Final    Comment: (NOTE) The Xpert Xpress SARS-CoV-2/FLU/RSV assay is intended as an aid in  the diagnosis of influenza from Nasopharyngeal swab specimens and  should not be used as a sole basis for treatment. Nasal washings and  aspirates are unacceptable for Xpert Xpress SARS-CoV-2/FLU/RSV  testing.  Fact Sheet for Patients: PinkCheek.be  Fact Sheet for Healthcare Providers: GravelBags.it  This test is not yet approved or cleared by the [place] FDA and  has been authorized for detection and/or diagnosis of SARS-CoV-2 by  FDA under an Emergency Use Authorization (EUA). This EUA will remain  in effect (meaning this test can be used) for the duration of the  Covid-19 declaration under Section 564(b)(1) of the Act, 21  U.S.C. section 360bbb-3(b)(1), unless the authorization is  terminated or revoked. Performed at [facility], [address] [place], [place] [address]   MRSA PCR  Screening     Status: None   Collection Time: May 16, 2020  2:17 AM   Specimen: Nasal Mucosa; Nasopharyngeal  Result Value Ref Range Status   MRSA by PCR NEGATIVE NEGATIVE Final    Comment:        The GeneXpert MRSA Assay (FDA approved for NASAL specimens only), is one component of a comprehensive MRSA colonization surveillance program. It is not intended to diagnose MRSA infection nor to guide or monitor treatment for MRSA infections. Performed  at [facility], [address], [place], [place] [address]     Lab Basic Metabolic Panel: Recent Labs  Lab 05/06/2020 1137 May 16, 2020 0338  NA 135 137  K 3.4* 3.7  CL 93* 96*  CO2 25 24  GLUCOSE 196* 181*  BUN 31* 49*  CREATININE 3.37* 4.95*  CALCIUM 8.8* 8.6*  MG 2.3  --    Liver Function Tests: Recent Labs  Lab 04/23/2020 1137  AST 19  ALT 18  ALKPHOS 91  BILITOT 1.3*  PROT 6.2*  ALBUMIN 3.0*   Recent Labs  Lab 04/27/2020 1137  LIPASE 24   No results for input(s): AMMONIA in the last 168 hours. CBC: Recent Labs  Lab 04/30/2020 1137 May 16, 2020 0338  WBC 13.8* 22.8*  NEUTROABS 10.6*  --   HGB 10.3* 10.2*  HCT 32.3* 32.0*  MCV 97.9 98.2  PLT 145* 171   Cardiac Enzymes: No results for input(s): CKTOTAL, CKMB, CKMBINDEX, TROPONINI in the last 168 hours. Sepsis Labs: Recent Labs  Lab 05/14/2020 1137 16-May-2020 0338 05-16-2020 0901  WBC 13.8* 22.8*  --   LATICACIDVEN  --   --  >11.0*    Procedures/Operations  None   Patrecia Pour, MD 05-16-2020, 6:38 PM

## 2020-05-17 NOTE — Progress Notes (Addendum)
Interim progress note.   Rounded on patient and found her to be lethargic, minimally responsive. Vital signs have grown unstable with hypotension overnight, fever this morning. Abdominal palpation elicited moaning but was not rigid or distended. Dark brown, no blood, output in NG suction canister. No GI bleeding reported since admission. Stat lactic acid and AXR as well as UA and culture ordered. Lactic acid returns >11. I call and speak with Dr. Solon Palm, general surgery, who had also evaluated the patient. We meet at the bedside for repeat abdominal evaluation which is unchanged. Fever continues despite IV tylenol, HR in 120-130's, BP soft, no hypoxia.   I am concerned for mesenteric ischemia (many risk factors for this, though no inflammation on CT scan at admission which was reviewed at the bedside by myself and Dr. Solon Palm), small bowel perforation, or perforation of known ulcer disease for which the patient had been on PPI and carafate (though no free air on XR), and/or urosepsis (no UA gathered at admission, I/O cath required for specimen and is pending). Regardless, with the patient's heart failure, renal failure, encephalopathy (brain failure), and hepatic cirrhosis with portal HTN in addition to advanced age, this patient is at very high risk of death.  The patient's daughter who is HCPOA, Jana Half, is at bedside. Confirmed DNR and attempted to expeditiously elicit goals of care. I've conveyed the urgency of giving broad IV antibiotics for undifferentiated sepsis, we've started IV fluid bolus. We have discussed the options of aggressive treatment including these measures and transfer to ICU. Alternatively, comfort measures have also been offered as an option which may be in line with the patient's previously reported desires not to end up like her sister who died last month after starting dialysis. The patient's daughter was left to call family and we will resume discussions shortly.  Vance Gather,  MD 2020/05/27 10:33 AM    ADDENDUM: Pt's HCPOA and family are all in agreement to convert to comfort measures only. Orders placed, will allow 3 people for visitation. Anticipate hospital death. I've consulted palliative care to assist.   Vance Gather, MD May 27, 2020 10:52 AM

## 2020-05-17 NOTE — Progress Notes (Signed)
May 13, 2020  Patient has become more hypotensive with fevers overnight with a much worsening white blood cell count of 22.8. Laboratory work-up also included a lactic acid which was more than 11. On exam, the patient is very obtunded. NG tube is in place with enteric contents. Her abdomen is soft nonperitoneal, nondistended. Unable to elicit any pain or discomfort with very deep pressure and palpation. Dr. Bonner Puna and I had a lengthy discussion with the patient's daughter who is the healthcare power of attorney. I do not think that this downturn is related to her small bowel obstruction as a CT scan did not show any evidence of swirling or volvulus or any bowel wall thickening. Although there is a potential for mesenteric ischemia given that her blood vessels are very calcified, there is no evidence on her CT scan of any inflammatory or thickening changes of the bowel. X-ray from today does not show any free air either.  The patient's daughter who is the healthcare power of attorney confirmed with the rest of her family and they have opted to convert her goals of care to comfort measures only. General surgery will sign off for now but please feel free to call us if there are any questions or if there is any assistance that we can provide.  Olean Ree, MD

## 2020-05-17 NOTE — Progress Notes (Signed)
Pt's family calls RN to room. Pt is breathing heavy, eyes closed. Pts HR is elevated. MD notified. Orders for one time dose of diliudid and MD will modify order asap. I will continue to assess.

## 2020-05-17 NOTE — Progress Notes (Signed)
   05-04-20 0739  Assess: MEWS Score  Temp (!) 101.4 F (38.6 C)  BP (!) 107/38  Pulse Rate 92  Resp 18  SpO2 95 %  O2 Device Room Air  Assess: MEWS Score  MEWS Temp 1  MEWS Systolic 0  MEWS Pulse 0  MEWS RR 0  MEWS LOC 1  MEWS Score 2  MEWS Score Color Yellow  Assess: if the MEWS score is Yellow or Red  Were vital signs taken at a resting state? Yes  Focused Assessment Change from prior assessment (see assessment flowsheet)  Early Detection of Sepsis Score *See Row Information* High  MEWS guidelines implemented *See Row Information* Yes  Treat  MEWS Interventions Escalated (See documentation below)  Pain Scale 0-10  Pain Score Asleep  Take Vital Signs  Increase Vital Sign Frequency  Yellow: Q 2hr X 2 then Q 4hr X 2, if remains yellow, continue Q 4hrs  Escalate  MEWS: Escalate Yellow: discuss with charge nurse/RN and consider discussing with provider and RRT  Notify: Charge Nurse/RN  Name of Charge Nurse/RN Notified Thomas Hoff, RN  Date Charge Nurse/RN Notified 05/04/2020  Time Charge Nurse/RN Notified 0800  Document  Patient Outcome Other (Comment) (comfort care measures implemented)  Progress note created (see row info) Yes

## 2020-05-17 NOTE — Plan of Care (Signed)
  Problem: Pain Managment: Goal: General experience of comfort will improve Outcome: Progressing   Problem: Safety: Goal: Ability to remain free from injury will improve Outcome: Progressing   

## 2020-05-17 NOTE — Progress Notes (Signed)
[facility]  ROUNDING NOTE   Subjective:  Katherine Hernandez is 86 years old female , with history of ESRD, recently started on hemodialysis and was discharged to SNF. She presented to the emergency department on 04/23/2020 with worsening nausea and vomiting and periumbilical abdominal pain.  She was diagnosed with a partial small bowel obstruction. Patient was seen in the room, daughter at the bedside.  Patient appears in moderate distress, respirations labored, tachycardic,has NG tube in place which is connected to low intermittent suction.  Hospitalist and family members are discussing about going forward with comfort care at this point.    Objective:  Vital signs in last 24 hours:  Temp:  [98.2 F (36.8 C)-101.4 F (38.6 C)] 101.2 F (38.4 C) (10/17 0952) Pulse Rate:  [81-118] 118 (10/17 0952) Resp:  [13-25] 18 (10/17 0952) BP: (88-126)/(28-60) 108/51 (10/17 0952) SpO2:  [93 %-96 %] 95 % (10/17 0952) Weight:  [72.3 kg-72.4 kg] 72.3 kg (10/17 0439)  Weight change:  Filed Weights   04/21/2020 1906 May 17, 2020 0439  Weight: 72.4 kg 72.3 kg    Intake/Output: I/O last 3 completed shifts: In: 331.5 [I.V.:331.5] Out: 400 [Emesis/NG output:400]   Intake/Output this shift:  No intake/output data recorded.  Physical Exam: General:  Lying in bed with eyes closed ,pale, in moderate respiratory distress  Head:  Normocephalic, atraumatic, NGT+  Eyes:  Anicteric  Neck:  Supple  Lungs:   Respirations labored, accessory muscle use +  Heart:  Regular, tachycardic on bedside monitor  Abdomen:   distended  Extremities:  No peripheral edema  Neurologic:  Lethargic  Skin:  No acute lesions or rashes noted  Access:  Right IJ PermCath    Basic Metabolic Panel: Recent Labs  Lab 05/13/2020 1137 05-17-20 0338  NA 135 137  K 3.4* 3.7  CL 93* 96*  CO2 25 24  GLUCOSE 196* 181*  BUN 31* 49*  CREATININE 3.37* 4.95*  CALCIUM 8.8* 8.6*  MG 2.3  --     Liver Function Tests: Recent  Labs  Lab 04/30/2020 1137  AST 19  ALT 18  ALKPHOS 91  BILITOT 1.3*  PROT 6.2*  ALBUMIN 3.0*   Recent Labs  Lab 05/10/2020 1137  LIPASE 24   No results for input(s): AMMONIA in the last 168 hours.  CBC: Recent Labs  Lab 05/12/2020 1137 May 17, 2020 0338  WBC 13.8* 22.8*  NEUTROABS 10.6*  --   HGB 10.3* 10.2*  HCT 32.3* 32.0*  MCV 97.9 98.2  PLT 145* 171    Cardiac Enzymes: No results for input(s): CKTOTAL, CKMB, CKMBINDEX, TROPONINI in the last 168 hours.  BNP: Invalid input(s): POCBNP  CBG: Recent Labs  Lab 05/15/2020 1458 04/24/2020 1937 05-17-2020 0024 05/17/2020 0434 05/17/2020 0742  GLUCAP 186* 180* 184* 178* 172*    Microbiology: Results for orders placed or performed during the hospital encounter of 04/20/2020  Respiratory Panel by RT PCR (Flu A&B, Covid) - Nasopharyngeal Swab     Status: None   Collection Time: 05/10/2020 12:43 PM   Specimen: Nasopharyngeal Swab  Result Value Ref Range Status   SARS Coronavirus 2 by RT PCR NEGATIVE NEGATIVE Final    Comment: (NOTE) SARS-CoV-2 target nucleic acids are NOT DETECTED.  The SARS-CoV-2 RNA is generally detectable in upper respiratoy specimens during the acute phase of infection. The lowest concentration of SARS-CoV-2 viral copies this assay can detect is 131 copies/mL. A negative result does not preclude SARS-Cov-2 infection and should not be used as the sole basis  for treatment or other patient management decisions. A negative result may occur with  improper specimen collection/handling, submission of specimen other than nasopharyngeal swab, presence of viral mutation(s) within the areas targeted by this assay, and inadequate number of viral copies (<131 copies/mL). A negative result must be combined with clinical observations, patient history, and epidemiological information. The expected result is Negative.  Fact Sheet for Patients:  https://www.fda.gov/media/142436/download  Fact Sheet for Healthcare Providers:   https://www.fda.gov/media/142435/download  This test is no t yet approved or cleared by the [place] FDA and  has been authorized for detection and/or diagnosis of SARS-CoV-2 by FDA under an Emergency Use Authorization (EUA). This EUA will remain  in effect (meaning this test can be used) for the duration of the COVID-19 declaration under Section 564(b)(1) of the Act, 21 U.S.C. section 360bbb-3(b)(1), unless the authorization is terminated or revoked sooner.     Influenza A by PCR NEGATIVE NEGATIVE Final   Influenza B by PCR NEGATIVE NEGATIVE Final    Comment: (NOTE) The Xpert Xpress SARS-CoV-2/FLU/RSV assay is intended as an aid in  the diagnosis of influenza from Nasopharyngeal swab specimens and  should not be used as a sole basis for treatment. Nasal washings and  aspirates are unacceptable for Xpert Xpress SARS-CoV-2/FLU/RSV  testing.  Fact Sheet for Patients: https://www.fda.gov/media/142436/download  Fact Sheet for Healthcare Providers: https://www.fda.gov/media/142435/download  This test is not yet approved or cleared by the [place] FDA and  has been authorized for detection and/or diagnosis of SARS-CoV-2 by  FDA under an Emergency Use Authorization (EUA). This EUA will remain  in effect (meaning this test can be used) for the duration of the  Covid-19 declaration under Section 564(b)(1) of the Act, 21  U.S.C. section 360bbb-3(b)(1), unless the authorization is  terminated or revoked. Performed at [facility], [address] [place], [place] [address]   MRSA PCR Screening     Status: None   Collection Time: 04/30/2020  2:17 AM   Specimen: Nasal Mucosa; Nasopharyngeal  Result Value Ref Range Status   MRSA by PCR NEGATIVE NEGATIVE Final    Comment:        The GeneXpert MRSA Assay (FDA approved for NASAL specimens only), is one component of a comprehensive MRSA colonization surveillance program. It is not intended to diagnose MRSA infection  nor to guide or monitor treatment for MRSA infections. Performed at [facility], [address] [place],  [address]     Coagulation Studies: No results for input(s): LABPROT, INR in the last 72 hours.  Urinalysis: No results for input(s): COLORURINE, LABSPEC, PHURINE, GLUCOSEU, HGBUR, BILIRUBINUR, KETONESUR, PROTEINUR, UROBILINOGEN, NITRITE, LEUKOCYTESUR in the last 72 hours.  Invalid input(s): APPERANCEUR    Imaging: CT Abdomen Pelvis Wo Contrast  Result Date: 05/14/2020 CLINICAL DATA:  Per RN note in pt chart "Pt presents via EMS from [place] c/o N/V/D x1 week after starting dialysis. Pt AANDOx4." EXAM: CT ABDOMEN AND PELVIS WITHOUT CONTRAST TECHNIQUE: Multidetector CT imaging of the abdomen and pelvis was performed following the standard protocol without IV contrast. COMPARISON:  Ultrasound, 08/10/2017.  CT chest, 11/11/2014. FINDINGS: Lower chest: Linear and reticular lung base opacities are noted consistent with atelectasis, scarring or a combination, similar to the prior chest CT. No acute findings. Hepatobiliary: Liver demonstrates nodularity, central volume loss and relative enlargement of the lateral segment of the left lobe and caudate lobe, consistent with advanced cirrhosis. No defined liver mass or lesion on this unenhanced exam. Liver appearance is similar   to the prior chest CT. Gallbladder surgically absent. No bile duct dilation. Pancreas: Unremarkable. No pancreatic ductal dilatation or surrounding inflammatory changes. Spleen: Enlarged spleen, 14 cm. There are splenic calcifications consistent with healed granuloma. No masses. Adrenals/Urinary Tract: No adrenal masses. Bilateral renal cortical thinning leading to reduced renal sizes, right greater than left. No renal masses. No collecting system stones. No hydronephrosis. Normal ureters. Non dependent air noted in the bladder which may be from recent instrumentation. No bladder wall thickening,  mass or stone. Stomach/Bowel: Stomach moderately distended. No wall thickening or inflammation. Dilated proximal small bowel, maximum diameter of 4.3 cm. There is associated air-fluid levels. No wall thickening or inflammation. Small bowel transitions to normal caliber in the central abdomen, with a somewhat gradual transition. Distal small bowel is normal in caliber. Colon is normal in caliber. No colonic wall thickening or inflammation. Vascular/Lymphatic: Dense aortic and branch vessel atherosclerotic calcifications. No aneurysm. No enlarged lymph nodes. Reproductive: Status post hysterectomy. No adnexal masses. Other: No hernia or ascites. Musculoskeletal: No fracture or acute finding. No osteoblastic or osteolytic lesions. IMPRESSION: 1. Dilated proximal small bowel transitioning to normal caliber small bowel in the mid abdomen. The transition is somewhat gradual, but a partial small bowel obstruction is still suspected as the etiology. No bowel wall thickening or inflammation. 2. No other evidence of an acute abnormality within the abdomen or pelvis. 3. Advanced cirrhosis. A component of portal venous hypertension is suggested by mild splenomegaly. These findings are similar to the prior chest CT. No visualized liver mass on the unenhanced images. 4. Significant bilateral renal cortical thinning consistent with medical renal disease. 5. Extensive aortic and branch vessel atherosclerosis. Electronically Signed   By: Lajean Manes M.D.   On: 05/16/2020 12:12   DG Abdomen 1 View  Result Date: 05/13/2020 CLINICAL DATA:  Evaluate NG tube placement EXAM: ABDOMEN - 1 VIEW COMPARISON:  None. FINDINGS: The side port and distal tip of the NG tube terminates in left upper quadrant, in the location of the stomach. No other acute abnormalities. IMPRESSION: Appropriate NG tube placement as above. Electronically Signed   By: Dorise Bullion III M.D   On: 05/08/2020 18:18   DG Chest Portable 1 View  Result Date:  05/13/2020 CLINICAL DATA:  86 year old female with nausea, vomiting, and diarrhea after starting dialysis. EXAM: PORTABLE CHEST 1 VIEW COMPARISON:  04/17/2020 FINDINGS: Interval insertion of right internal jugular tunneled hemodialysis catheter with the tip terminating in right atrium. Cardiomediastinal silhouette is within normal limits and unchanged. Multiple surgical staples are again seen projecting over the mediastinum as well as intact median sternotomy wires. Trace bilateral, left greater than right subsegmental atelectasis. Interval improvement in previously visualized pulmonary edema. Mild blunting of left costophrenic angle. No pneumothorax. Coarse atherosclerotic calcifications of the thoracic aorta. No acute osseous abnormality. IMPRESSION: 1. Overall improved fluid status with resolution of previously visualized mild pulmonary edema. There is a possible trace left pleural effusion. 2. Interval insertion of right internal jugular tunneled hemodialysis catheter with the tip in the right atrium. No evidence of pneumothorax. 3.  Aortic Atherosclerosis (ICD10-I70.0). Electronically Signed   By: Ruthann Cancer MD   On: 04/16/2020 12:20   DG Abd Portable 2V  Result Date: May 19, 2020 CLINICAL DATA:  SBO EXAM: PORTABLE ABDOMEN - 2 VIEW COMPARISON:  04/20/2020 FINDINGS: Dilated loops of small bowel in the right lower quadrant, compatible with the clinical history of small bowel obstruction. Gas-filled ascending colon. Left colon is decompressed. Right lower quadrant hernia mesh. No  evidence of free air under the diaphragm on the upright view. Enteric tube terminates in the gastric antrum. Visualized lungs are clear. No definite pleural effusions. The heart is normal in size. Postsurgical changes related to prior CABG. Thoracic aortic atherosclerosis. Right IJ dual lumen dialysis catheter terminates the cavoatrial junction. IMPRESSION: Dilated loops of small bowel in the right lower quadrant, compatible with  the clinical history of small bowel obstruction. Enteric tube terminates in the gastric antrum. No free air. Electronically Signed   By: Julian Hy M.D.   On: 25-May-2020 09:21     Medications:   . acetaminophen 1,000 mg (May 25, 2020 0950)   . heparin  5,000 Units Subcutaneous Q8H   albuterol, antiseptic oral rinse, glycopyrrolate **OR** glycopyrrolate **OR** glycopyrrolate, haloperidol **OR** haloperidol **OR** haloperidol lactate, HYDROmorphone (DILAUDID) injection, [DISCONTINUED] ondansetron **OR** ondansetron (ZOFRAN) IV, polyvinyl alcohol  Assessment/ Plan:  86 y.o. female with past medical history of myelodysplastic syndrome, chronic kidney disease stage V baseline EGFR 14, diabetes mellitus type 2, hypertension, gout, coronary artery disease status post PTCA and CABG, peripheral neuropathy, GERD, hyperlipidemia, diabetic retinopathy, restless leg syndrome, anemia and ESRD on dialysis MWF presented to the emergency department on 05/03/2020 with worsening nausea and vomiting and periumbilical abdominal pain.  She was diagnosed with a partial small bowel obstruction.  1.  ESRD Patient was on dialysis on Monday Wednesday Friday at [facility], Right IJ PermCath  -We talked to the daughter, present at the bedside. -Patient's family decided to go forward with comfort measures -No  Plan for dialysis  -Nephrology signing off, please call us with any questions.   LOS: 1 Katherine Hernandez 09-Nov-[id number] AM

## 2020-05-17 DEATH — deceased

## 2020-05-18 ENCOUNTER — Ambulatory Visit: Payer: PRIVATE HEALTH INSURANCE

## 2020-05-18 ENCOUNTER — Ambulatory Visit: Payer: PRIVATE HEALTH INSURANCE | Admitting: Oncology

## 2020-05-28 ENCOUNTER — Other Ambulatory Visit: Payer: PRIVATE HEALTH INSURANCE

## 2020-05-31 ENCOUNTER — Other Ambulatory Visit: Payer: PRIVATE HEALTH INSURANCE

## 2020-05-31 ENCOUNTER — Ambulatory Visit: Payer: PRIVATE HEALTH INSURANCE | Admitting: Oncology

## 2021-09-12 IMAGING — US US RENAL
1 series · 14 of 25 positions shown · non-contrast
Comparison: Prior ultrasound from 08/10/2017.

CLINICAL DATA: Initial evaluation for decreased urinary output.

EXAM:
RENAL / URINARY TRACT ULTRASOUND COMPLETE

[Series 1: us renal · 0.23mm/px · 14 of 37 slices shown]
[im 1/37]
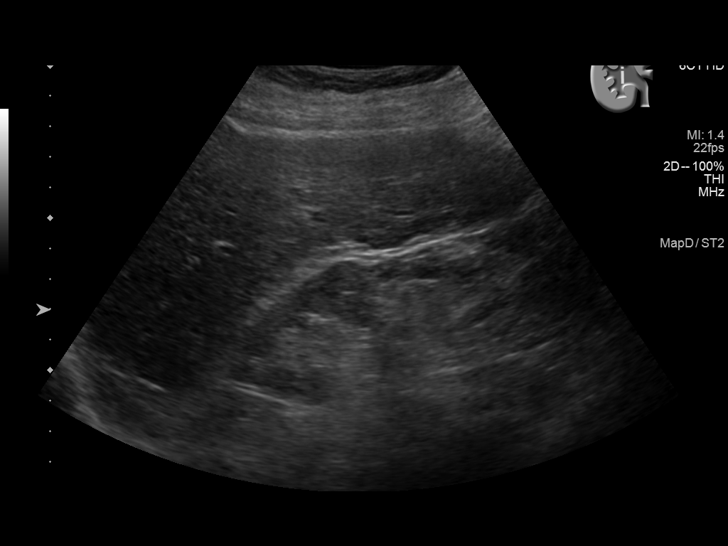
[im 4/37]
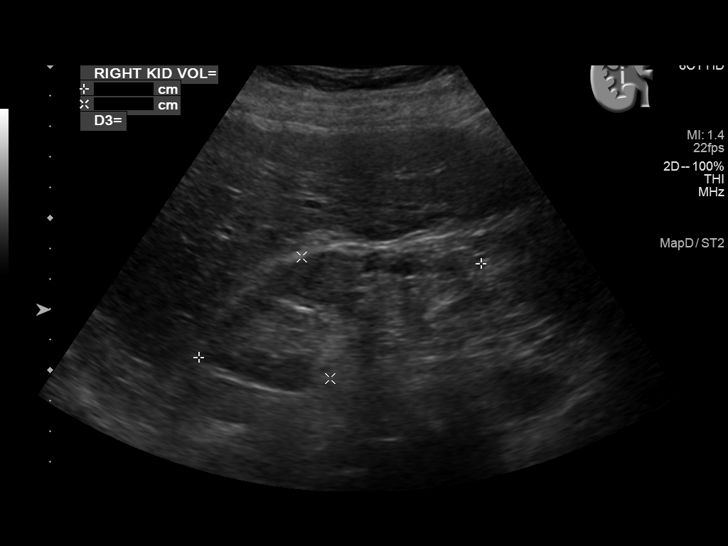
[im 7/37]
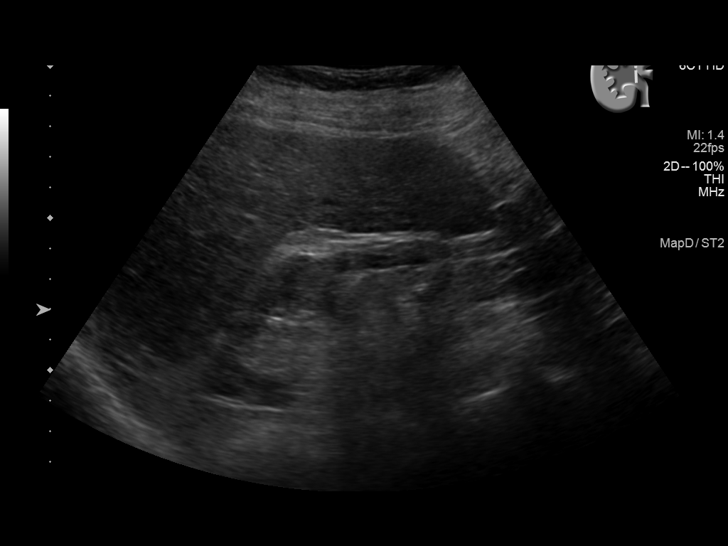
[im 10/37]
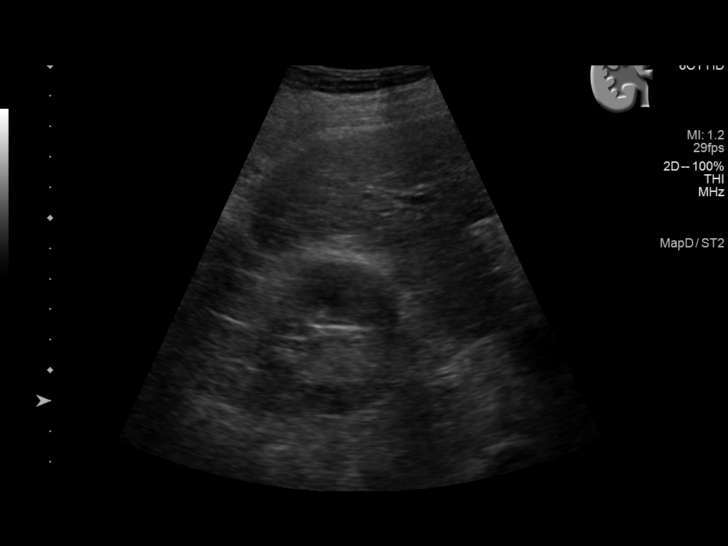
[im 13/37]
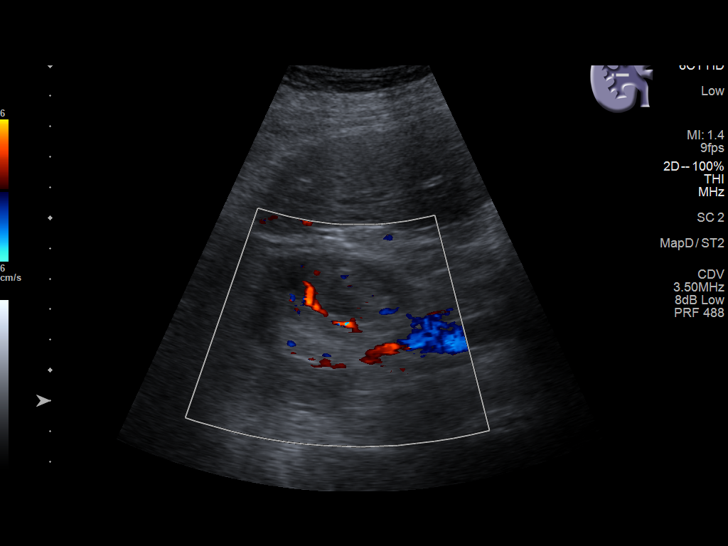
[im 14/37]
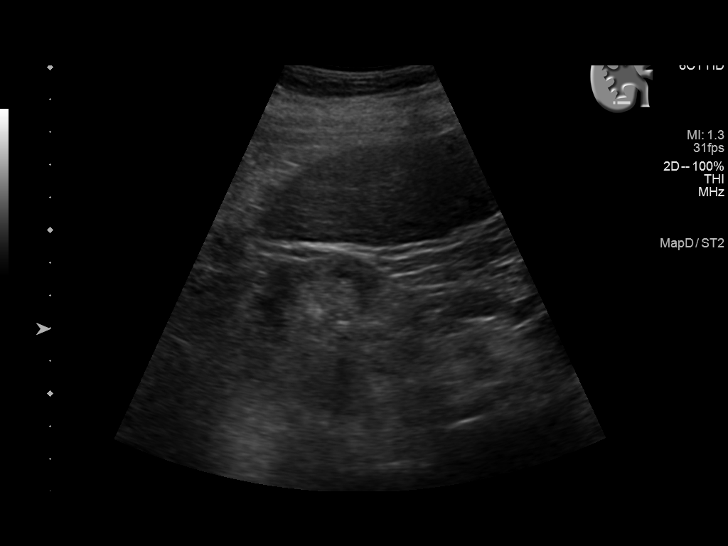
[im 17/37]
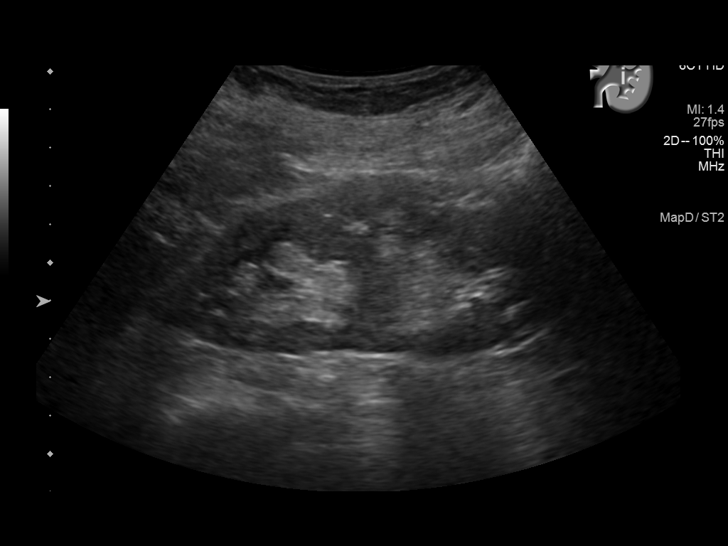
[im 20/37]
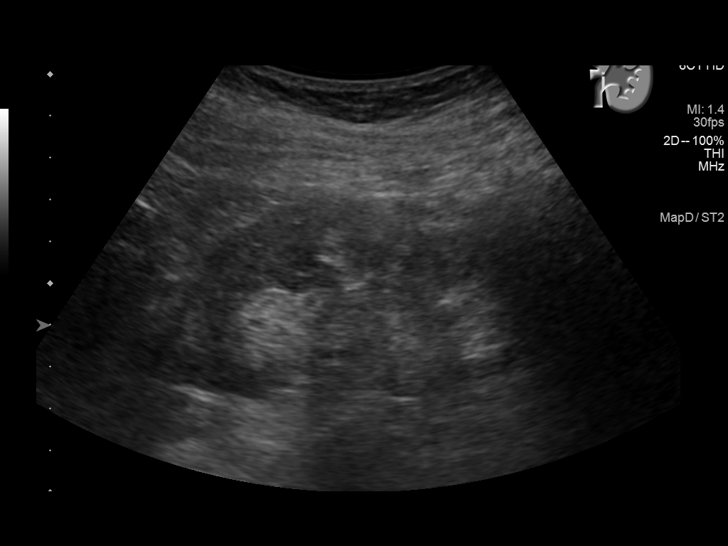
[im 23/37]
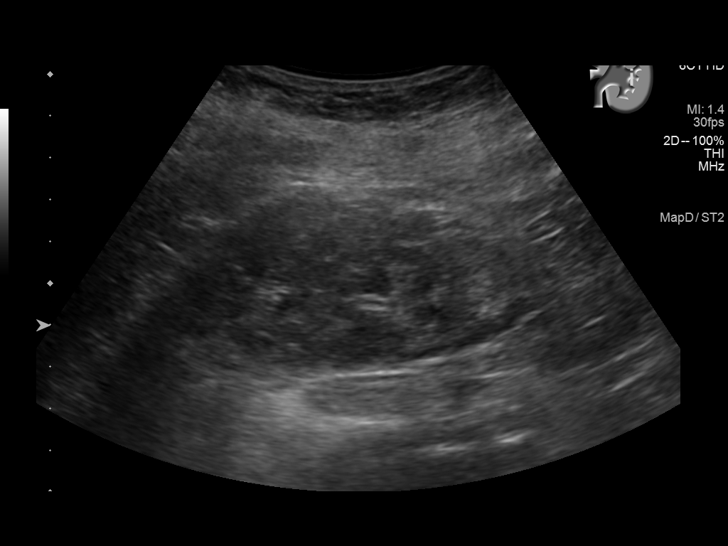
[im 25/37]
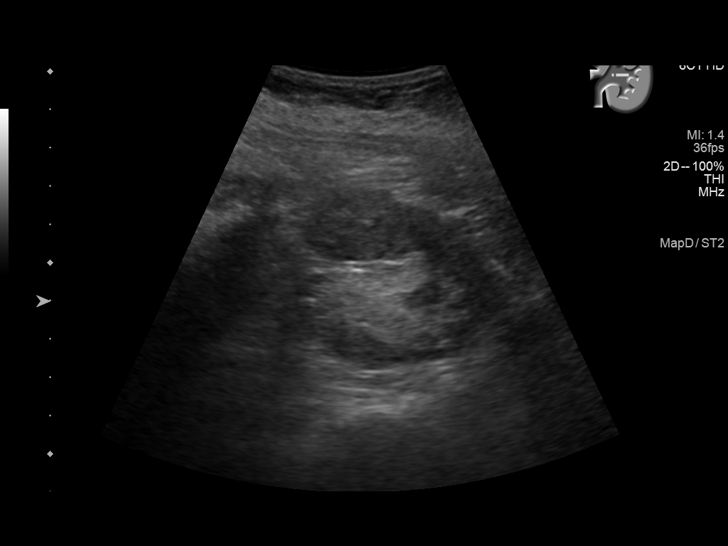
[im 28/37]
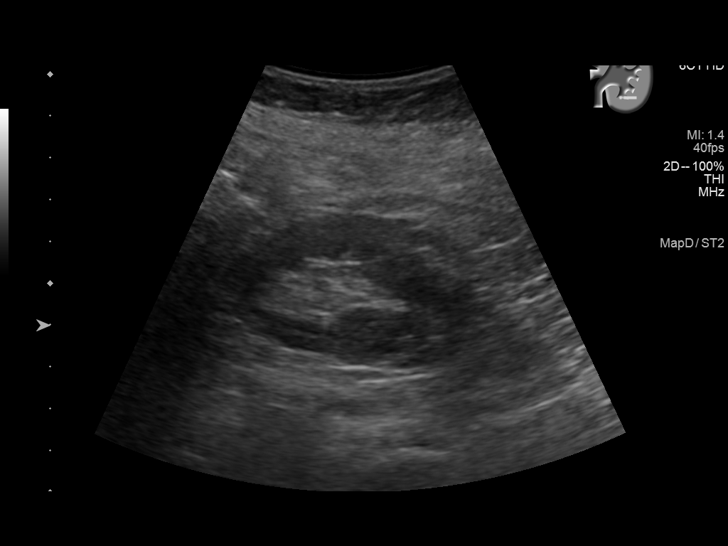
[im 31/37]
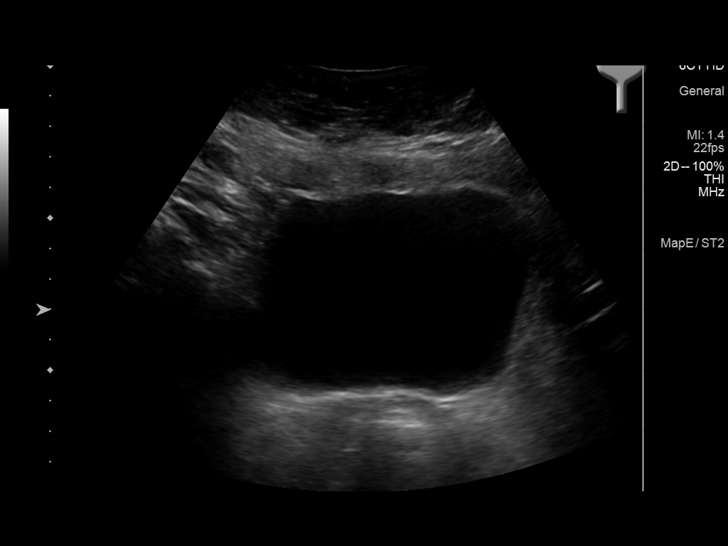
[im 34/37]
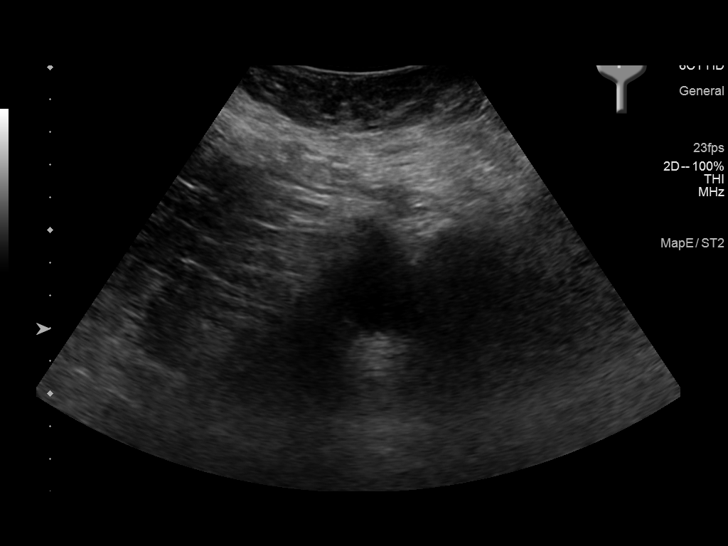
[im 37/37]
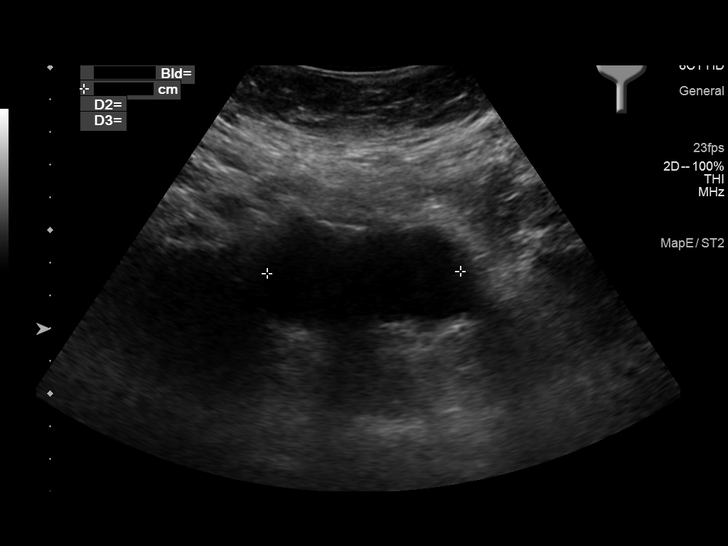

[14 of 25 positions shown; findings below may reference images not displayed]

FINDINGS: Right Kidney:

Renal measurements: 9.8 x 4.1 x 5.0 cm = volume: 105 mL. Diffuse
cortical thinning with increased echogenicity within the renal
parenchyma, compatible with chronic medical renal disease. 4 mm
nonobstructive calculus present at the lower pole. No
hydronephrosis. No focal renal mass.

Left Kidney:

Renal measurements: 10.8 x 4.7 x 5.1 cm = volume: 133 mL. Mild
diffuse cortical thinning with increased echogenicity within the
renal parenchyma, consistent with medical renal disease. No
nephrolithiasis or hydronephrosis. No focal renal mass.

Bladder:

Appears normal for degree of bladder distention.

Other:

None.
IMPRESSION: 1. Diffuse cortical thinning with increased echogenicity within the
renal parenchyma, compatible with chronic medical renal disease.
2. 4 mm nonobstructive right renal calculus.  No hydronephrosis.

## 2022-01-19 IMAGING — DX DG CHEST 1V
1 series · 1 of 1 positions shown · non-contrast
Comparison: May 25, 2017

CLINICAL DATA: Shortness of breath and edema

EXAM:
CHEST  1 VIEW

[chest ap]
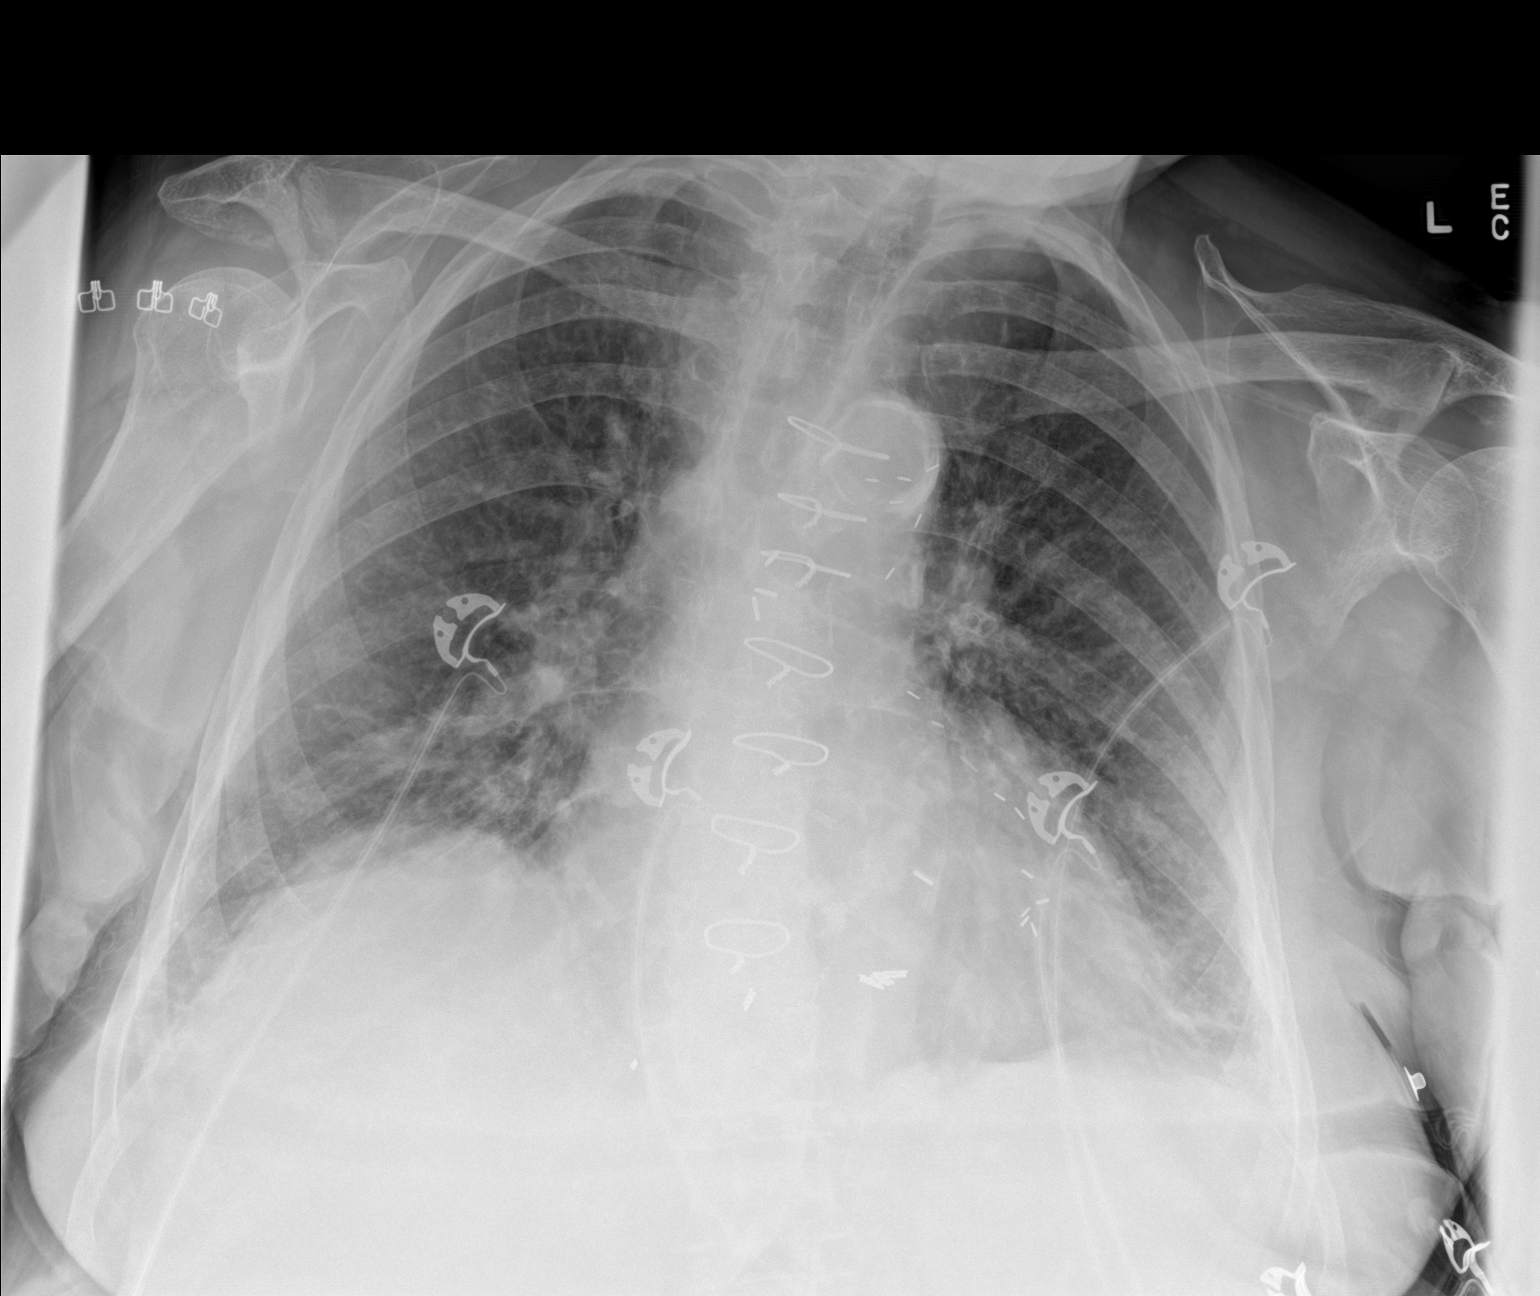

[1 of 1 positions shown; findings below may reference images not displayed]

FINDINGS: The heart size and mediastinal contours are unchanged with mild
cardiomegaly. Aortic knob calcifications are seen. Overlying median
sternotomy wires are present. There is prominence of the central
pulmonary vasculature. No large airspace consolidation or pleural
effusion. No acute osseous abnormality.
IMPRESSION: Mild cardiomegaly and pulmonary vascular congestion
